# Patient Record
Sex: Female | Born: 1965 | Race: White | Hispanic: No | Smoking: Never smoker
Health system: Southern US, Community
[De-identification: ages and names within clinical notes are randomized; demographics above are authoritative.]

## PROBLEM LIST (undated history)

## (undated) DIAGNOSIS — R112 Nausea with vomiting, unspecified: Secondary | ICD-10-CM

## (undated) DIAGNOSIS — I493 Ventricular premature depolarization: Secondary | ICD-10-CM

## (undated) DIAGNOSIS — E611 Iron deficiency: Secondary | ICD-10-CM

## (undated) DIAGNOSIS — Z8489 Family history of other specified conditions: Secondary | ICD-10-CM

## (undated) DIAGNOSIS — K219 Gastro-esophageal reflux disease without esophagitis: Secondary | ICD-10-CM

## (undated) DIAGNOSIS — C4491 Basal cell carcinoma of skin, unspecified: Secondary | ICD-10-CM

## (undated) DIAGNOSIS — Z8719 Personal history of other diseases of the digestive system: Secondary | ICD-10-CM

## (undated) DIAGNOSIS — Z973 Presence of spectacles and contact lenses: Secondary | ICD-10-CM

## (undated) DIAGNOSIS — R569 Unspecified convulsions: Secondary | ICD-10-CM

## (undated) DIAGNOSIS — D649 Anemia, unspecified: Secondary | ICD-10-CM

## (undated) DIAGNOSIS — N289 Disorder of kidney and ureter, unspecified: Secondary | ICD-10-CM

## (undated) DIAGNOSIS — T7840XA Allergy, unspecified, initial encounter: Secondary | ICD-10-CM

## (undated) DIAGNOSIS — I10 Essential (primary) hypertension: Secondary | ICD-10-CM

## (undated) DIAGNOSIS — Z87442 Personal history of urinary calculi: Secondary | ICD-10-CM

## (undated) DIAGNOSIS — Z9889 Other specified postprocedural states: Secondary | ICD-10-CM

## (undated) DIAGNOSIS — Z87440 Personal history of urinary (tract) infections: Secondary | ICD-10-CM

## (undated) DIAGNOSIS — N200 Calculus of kidney: Secondary | ICD-10-CM

## (undated) DIAGNOSIS — M439 Deforming dorsopathy, unspecified: Secondary | ICD-10-CM

## (undated) DIAGNOSIS — N750 Cyst of Bartholin's gland: Secondary | ICD-10-CM

## (undated) DIAGNOSIS — M419 Scoliosis, unspecified: Secondary | ICD-10-CM

## (undated) DIAGNOSIS — E7800 Pure hypercholesterolemia, unspecified: Secondary | ICD-10-CM

## (undated) DIAGNOSIS — G039 Meningitis, unspecified: Secondary | ICD-10-CM

## (undated) HISTORY — PX: TONSILLECTOMY: SUR1361

## (undated) HISTORY — PX: ANTERIOR AND POSTERIOR VAGINAL REPAIR: SUR5

## (undated) HISTORY — PX: FOOT SURGERY: SHX648

## (undated) HISTORY — PX: SEPTOPLASTY: SUR1290

## (undated) HISTORY — PX: GANGLION CYST EXCISION: SHX1691

## (undated) HISTORY — DX: Cyst of Bartholin's gland: N75.0

## (undated) HISTORY — DX: Personal history of urinary calculi: Z87.442

## (undated) HISTORY — PX: RECTAL SURGERY: SHX760

## (undated) HISTORY — DX: Allergy, unspecified, initial encounter: T78.40XA

## (undated) HISTORY — PX: INCONTINENCE SURGERY: SHX676

## (undated) HISTORY — DX: Gastro-esophageal reflux disease without esophagitis: K21.9

## (undated) HISTORY — DX: Personal history of urinary (tract) infections: Z87.440

## (undated) HISTORY — PX: BLADDER SURGERY: SHX569

---

## 1988-08-31 DIAGNOSIS — N750 Cyst of Bartholin's gland: Secondary | ICD-10-CM

## 1988-08-31 HISTORY — DX: Cyst of Bartholin's gland: N75.0

## 2006-04-21 ENCOUNTER — Ambulatory Visit: Payer: Self-pay | Admitting: Surgery

## 2006-04-23 ENCOUNTER — Ambulatory Visit: Payer: Self-pay | Admitting: Surgery

## 2006-06-25 ENCOUNTER — Ambulatory Visit: Payer: Self-pay | Admitting: Gastroenterology

## 2006-10-28 ENCOUNTER — Ambulatory Visit: Payer: Self-pay | Admitting: Obstetrics and Gynecology

## 2006-12-01 ENCOUNTER — Encounter: Payer: Self-pay | Admitting: General Practice

## 2006-12-30 ENCOUNTER — Encounter: Payer: Self-pay | Admitting: General Practice

## 2008-02-02 ENCOUNTER — Other Ambulatory Visit: Payer: Self-pay

## 2008-02-02 ENCOUNTER — Ambulatory Visit: Payer: Self-pay | Admitting: Obstetrics and Gynecology

## 2008-02-13 ENCOUNTER — Ambulatory Visit: Payer: Self-pay | Admitting: Obstetrics and Gynecology

## 2009-08-31 HISTORY — PX: CHOLECYSTECTOMY: SHX55

## 2009-08-31 HISTORY — PX: ENDOMETRIAL ABLATION: SHX621

## 2009-08-31 HISTORY — PX: GASTRIC BYPASS: SHX52

## 2010-04-15 ENCOUNTER — Ambulatory Visit: Payer: Self-pay | Admitting: Family Medicine

## 2010-04-21 ENCOUNTER — Emergency Department: Payer: Self-pay | Admitting: Emergency Medicine

## 2010-04-22 ENCOUNTER — Ambulatory Visit: Payer: Self-pay | Admitting: Surgery

## 2010-05-19 ENCOUNTER — Ambulatory Visit: Payer: Self-pay

## 2011-02-18 ENCOUNTER — Ambulatory Visit: Payer: Self-pay | Admitting: Family Medicine

## 2012-01-18 ENCOUNTER — Ambulatory Visit: Payer: Self-pay

## 2012-01-18 LAB — URINALYSIS, COMPLETE
Bilirubin,UR: NEGATIVE
Nitrite: NEGATIVE
Specific Gravity: 1.015 (ref 1.003–1.030)

## 2012-11-08 ENCOUNTER — Ambulatory Visit: Payer: Self-pay | Admitting: Family Medicine

## 2013-08-14 ENCOUNTER — Ambulatory Visit: Payer: Self-pay | Admitting: Urology

## 2013-08-14 DIAGNOSIS — N3020 Other chronic cystitis without hematuria: Secondary | ICD-10-CM | POA: Insufficient documentation

## 2013-08-14 DIAGNOSIS — N200 Calculus of kidney: Secondary | ICD-10-CM | POA: Insufficient documentation

## 2013-08-14 DIAGNOSIS — N23 Unspecified renal colic: Secondary | ICD-10-CM | POA: Insufficient documentation

## 2013-08-14 LAB — PREGNANCY, URINE: Pregnancy Test, Urine: NEGATIVE m[IU]/mL

## 2013-09-07 ENCOUNTER — Ambulatory Visit: Payer: Self-pay | Admitting: Urology

## 2013-09-20 ENCOUNTER — Ambulatory Visit: Payer: Self-pay | Admitting: Urology

## 2014-03-06 ENCOUNTER — Ambulatory Visit: Payer: Self-pay | Admitting: Family Medicine

## 2014-03-08 ENCOUNTER — Ambulatory Visit: Payer: Self-pay | Admitting: Family Medicine

## 2014-05-30 ENCOUNTER — Ambulatory Visit: Payer: Self-pay

## 2014-08-31 HISTORY — PX: LITHOTRIPSY: SUR834

## 2014-11-21 ENCOUNTER — Ambulatory Visit: Payer: Self-pay | Admitting: Obstetrics and Gynecology

## 2014-12-06 ENCOUNTER — Ambulatory Visit
Admit: 2014-12-06 | Disposition: A | Discharge status: Home / Self Care | Payer: Self-pay | Attending: Obstetrics and Gynecology | Admitting: Obstetrics and Gynecology

## 2014-12-06 HISTORY — PX: COMBINED HYSTEROSCOPY DIAGNOSTIC / D&C: SUR297

## 2014-12-10 ENCOUNTER — Encounter: Payer: Self-pay | Admitting: *Deleted

## 2014-12-24 LAB — SURGICAL PATHOLOGY

## 2014-12-30 NOTE — Op Note (Signed)
PATIENT NAME:  Elizabeth Good, Elizabeth Good MR#:  [id number] DATE OF BIRTH:  04/27/1966  DATE OF PROCEDURE:  12/06/2014  PREOPERATIVE DIAGNOSIS: Abnormal uterine bleeding abnormal endometrial ultrasound.   POSTOPERATIVE DIAGNOSIS: Abnormal uterine bleeding abnormal endometrial ultrasound with normal postablative changes noted.   OPERATION PERFORMED: Hysteroscopy, dilatation and curettage.  ANESTHESIA USED: General.   SURGEON: Malachy Mood, MD  ESTIMATED BLOOD LOSS: Minimal.   OPERATIVE FLUIDS: 500 mL.   URINE OUTPUT: 150 mL.   COMPLICATIONS: None.   DRAINS OR TUBES: None.   IMPLANTS: None.   FINDINGS: Normal-appearing ectocervix. Cavity scarred from prior endometrial ablation. No menometrorrhagia encountered during the dilation. Cervix was grossly normal in appearance.   SPECIMENS REMOVED: Endometrial curettings.   PROCEDURE IN DETAIL: Risks, benefits, and alternatives of the procedure were discussed with the patient prior to proceeding to the Operating Room. The patient was taken to the Operating Room and placed under general endotracheal anesthesia. She was positioned in the dorsal lithotomy position using Allen stirrups, prepped and draped in the usual sterile fashion. Timeout was performed. The bladder was straight catheterized with a red rubber catheter. An operative speculum was placed. The anterior lip of the cervix was grasped with a single-tooth tenaculum. The cervix was sequentially dilated using Pratt dilators. A hysteroscope was then advanced into the uterus, noting a scarred cavity, but otherwise normal findings. A sharp curettage was performed. Following the sharp curettage, second look hysteroscopy was performed, noting clearance of most of the  previously noted adhesions. The hysteroscope was removed, as was the single-tooth tenaculum. Sponge, needle, and instrument counts were correct x2. The patient tolerated the procedure well and was taken to the recovery room in stable  condition.   ____________________________ Stoney Bang. Georgianne Fick, MD ams:AT D: 12/06/2014 22:36:13 ET T: 12/07/2014 08:29:56 ET JOB#: [id number]  cc: Stoney Bang. Georgianne Fick, MD, <Dictator> Dorthula Nettles MD ELECTRONICALLY SIGNED 12/19/2014 22:44

## 2015-04-03 LAB — BASIC METABOLIC PANEL
Anion Gap: 6 — ABNORMAL LOW (ref 7–16)
BUN: 10 mg/dL
Calcium, Total: 8.5 mg/dL — ABNORMAL LOW
Chloride: 103 mmol/L
Co2: 28 mmol/L
Creatinine: 0.71 mg/dL
EGFR (African American): >60
EGFR (Non-African Amer.): >60
Glucose: 89 mg/dL
Potassium: 3.3 mmol/L — ABNORMAL LOW
SODIUM: 137 mmol/L

## 2015-04-03 LAB — CBC
HCT: 34.9 % — AB (ref 35.0–47.0)
HGB: 11.1 g/dL — ABNORMAL LOW (ref 12.0–16.0)
MCH: 25.5 pg — ABNORMAL LOW (ref 26.0–34.0)
MCHC: 31.8 g/dL — ABNORMAL LOW (ref 32.0–36.0)
MCV: 80 fL (ref 80–100)
Platelet: 275 10*3/uL (ref 150–440)
RBC: 4.34 10*6/uL (ref 3.80–5.20)
RDW: 14.5 % (ref 11.5–14.5)
WBC: 6.7 10*3/uL (ref 3.6–11.0)

## 2015-07-19 ENCOUNTER — Ambulatory Visit: Payer: Self-pay | Admitting: Physician Assistant

## 2015-07-19 ENCOUNTER — Encounter: Payer: Self-pay | Admitting: Physician Assistant

## 2015-07-19 VITALS — BP 150/100 | Temp 99.1°F

## 2015-07-19 DIAGNOSIS — R059 Cough, unspecified: Secondary | ICD-10-CM

## 2015-07-19 DIAGNOSIS — Z299 Encounter for prophylactic measures, unspecified: Secondary | ICD-10-CM

## 2015-07-19 DIAGNOSIS — R05 Cough: Secondary | ICD-10-CM

## 2015-07-19 DIAGNOSIS — J019 Acute sinusitis, unspecified: Secondary | ICD-10-CM

## 2015-07-19 MED ORDER — AZITHROMYCIN 250 MG PO TABS: ORAL_TABLET | ORAL | Status: DC

## 2015-07-19 MED ORDER — HYDROCOD POLST-CPM POLST ER 10-8 MG/5ML PO SUER: 5.0000 mL | Freq: Two times a day (BID) | ORAL | Status: DC | PRN

## 2015-07-19 MED ORDER — FLUCONAZOLE 150 MG PO TABS: 150.0000 mg | ORAL_TABLET | Freq: Once | ORAL | Status: DC

## 2015-07-19 NOTE — Progress Notes (Signed)
S/ one wk hx of nasal congestion, now with dental /facial pain, malaise, blowing green , cough not sleeping due to drainage  O/ low grade temp noted mildly ill appearing  ENT: nasal mucosa inflamed with purulent drainage + r max tenderness , tms and throat clear Neck supple heart rsr lungs clear A/ rhinosinusitis  Cough  P/ rx zpack, diflucan , H/C chlorphen ER i tsp q 12 h prn #100 cc 0 rf.  Supportive measures

## 2015-08-12 ENCOUNTER — Ambulatory Visit (INDEPENDENT_AMBULATORY_CARE_PROVIDER_SITE_OTHER): Payer: BC Managed Care – PPO

## 2015-08-12 ENCOUNTER — Encounter: Payer: Self-pay | Admitting: Emergency Medicine

## 2015-08-12 ENCOUNTER — Ambulatory Visit
Admission: EM | Admit: 2015-08-12 | Discharge: 2015-08-12 | Disposition: A | Discharge status: Home / Self Care | Payer: BC Managed Care – PPO | Attending: Family Medicine | Admitting: Family Medicine

## 2015-08-12 DIAGNOSIS — N390 Urinary tract infection, site not specified: Secondary | ICD-10-CM | POA: Diagnosis not present

## 2015-08-12 DIAGNOSIS — R109 Unspecified abdominal pain: Secondary | ICD-10-CM | POA: Diagnosis not present

## 2015-08-12 HISTORY — DX: Disorder of kidney and ureter, unspecified: N28.9

## 2015-08-12 LAB — URINALYSIS COMPLETE WITH MICROSCOPIC (ARMC ONLY)
BILIRUBIN URINE: NEGATIVE
Glucose, UA: NEGATIVE mg/dL
KETONES UR: NEGATIVE mg/dL
LEUKOCYTES UA: NEGATIVE
NITRITE: NEGATIVE
PH: 5.5 (ref 5.0–8.0)
Protein, ur: 30 mg/dL — AB
SPECIFIC GRAVITY, URINE: 1.03 (ref 1.005–1.030)

## 2015-08-12 MED ORDER — FLUCONAZOLE 150 MG PO TABS: 150.0000 mg | ORAL_TABLET | Freq: Every day | ORAL | Status: DC

## 2015-08-12 MED ORDER — CIPROFLOXACIN HCL 500 MG PO TABS: 500.0000 mg | ORAL_TABLET | Freq: Two times a day (BID) | ORAL | Status: DC

## 2015-08-12 MED ORDER — HYDROCODONE-ACETAMINOPHEN 5-325 MG PO TABS: 1.0000 | ORAL_TABLET | Freq: Four times a day (QID) | ORAL | Status: DC | PRN

## 2015-08-12 NOTE — ED Notes (Signed)
Patient c/o burning when urinating and lower back pain and nausea for 2 weeks.  Patient denies fever.

## 2015-08-12 NOTE — ED Provider Notes (Signed)
CSN: [id number]     Arrival date & time 08/12/15  1127 History   First MD Initiated Contact with Patient 08/12/15 1216     Chief Complaint  Patient presents with  . Dysuria   (Consider location/radiation/quality/duration/timing/severity/associated sxs/prior Treatment) Patient is a 49 y.o. female presenting with dysuria. The history is provided by the patient.  Dysuria Pain quality:  Burning Pain severity:  Mild Onset quality:  Sudden Duration:  2 weeks Timing:  Constant Progression:  Worsening Chronicity:  New Recent urinary tract infections: no   Relieved by:  None tried Ineffective treatments:  None tried Urinary symptoms: discolored urine, frequent urination and hematuria   Urinary symptoms: no hesitancy and no bladder incontinence   Associated symptoms: no fever, no flank pain, no nausea, no vaginal discharge and no vomiting   Risk factors: hx of urolithiasis     Past Medical History  Diagnosis Date  . Renal disorder    Past Surgical History  Procedure Laterality Date  . Tonsillectomy    . Cholecystectomy    . Gastric bypass    . Bladder surgery    . Rectal surgery    . Foot surgery Left    History reviewed. No pertinent family history. Social History  Substance Use Topics  . Smoking status: Never Smoker   . Smokeless tobacco: None  . Alcohol Use: No   OB History    No data available     Review of Systems  Constitutional: Negative for fever.  Gastrointestinal: Negative for nausea and vomiting.  Genitourinary: Positive for dysuria. Negative for flank pain and vaginal discharge.    Allergies  Latex; Penicillins; and Sulfa antibiotics  Home Medications   Prior to Admission medications   Medication Sig Start Date End Date Taking? Authorizing Provider  azithromycin (ZITHROMAX Z-PAK) 250 MG tablet As directed 07/19/15   Blima Singer, FNP  cetirizine (ZYRTEC) 10 MG tablet Take 10 mg by mouth daily.    Historical Provider, MD   chlorpheniramine-HYDROcodone (TUSSIONEX PENNKINETIC ER) 10-8 MG/5ML SUER Take 5 mLs by mouth every 12 (twelve) hours as needed for cough. 07/19/15   Tommie Homero Fellers, FNP  ciprofloxacin (CIPRO) 500 MG tablet Take 1 tablet (500 mg total) by mouth every 12 (twelve) hours. 08/12/15   Norval Gable, MD  fluconazole (DIFLUCAN) 150 MG tablet Take 1 tablet (150 mg total) by mouth daily. 08/12/15   Norval Gable, MD  HYDROcodone-acetaminophen (NORCO/VICODIN) 5-325 MG tablet Take 1-2 tablets by mouth every 6 (six) hours as needed. 08/12/15   Norval Gable, MD  lamoTRIgine (LAMICTAL) 200 MG tablet Take 200 mg by mouth daily.    Historical Provider, MD  pantoprazole (PROTONIX) 40 MG tablet Take 40 mg by mouth daily.    Historical Provider, MD   Meds Ordered and Administered this Visit  Medications - No data to display  BP 162/92 mmHg  Pulse 88  Temp(Src) 97 F (36.1 C) (Tympanic)  Resp 16  Ht 5\' 5"  (1.651 m)  Wt 160 lb (72.576 kg)  BMI 26.63 kg/m2  SpO2 100%  LMP 07/23/2015 (Exact Date) No data found.   Physical Exam  Constitutional: She appears well-developed and well-nourished. No distress.  Abdominal: Soft. Bowel sounds are normal. She exhibits no distension and no mass. There is tenderness (mild suprapubic tenderness to palpation; mild left flank tenderness). There is no rebound and no guarding.  Skin: She is not diaphoretic.  Nursing note and vitals reviewed.   ED Course  Procedures (including critical care time)  Labs Review Labs Reviewed  URINALYSIS COMPLETEWITH MICROSCOPIC ([place] ONLY) - Abnormal; Notable for the following:    APPearance CLOUDY (*)    Hgb urine dipstick 3+ (*)    Protein, ur 30 (*)    Bacteria, UA MANY (*)    Squamous Epithelial / LPF 6-30 (*)    All other components within normal limits  URINE CULTURE    Imaging Review Dg Abd 2 Views  08/12/2015  CLINICAL DATA:  Left flank pain for 1 day with nausea. History of stones, gastric bypass and  cholecystectomy. EXAM: ABDOMEN - 2 VIEW COMPARISON:  Radiographs 09/20/2013 and 09/07/2013. FINDINGS: There are no suspicious calcifications over the kidneys or expected course of the ureters. There are postsurgical changes from gastric bypass and cholecystectomy. Moderate stool is present throughout the colon. There is no free intraperitoneal air. Moderate convex left lumbar scoliosis appears unchanged. IMPRESSION: No radiographic evidence of urinary tract calculus. Moderate stool throughout the colon. Electronically Signed   By: Richardean Sale M.D.   On: 08/12/2015 13:40     Visual Acuity Review  Right Eye Distance:   Left Eye Distance:   Bilateral Distance:    Right Eye Near:   Left Eye Near:    Bilateral Near:         MDM   1. UTI (lower urinary tract infection)   2. Left flank pain    Discharge Medication List as of 08/12/2015  1:50 PM    START taking these medications   Details  ciprofloxacin (CIPRO) 500 MG tablet Take 1 tablet (500 mg total) by mouth every 12 (twelve) hours., Starting 08/12/2015, Until Discontinued, Normal    HYDROcodone-acetaminophen (NORCO/VICODIN) 5-325 MG tablet Take 1-2 tablets by mouth every 6 (six) hours as needed., Starting 08/12/2015, Until Discontinued, Print      1. Labs/x-ray results and diagnosis reviewed with patient 2. rx as per orders above; reviewed possible side effects, interactions, risks and benefits  3. Recommend supportive treatment with increased fluids/water intake 4. Follow-up prn if symptoms worsen or don't improve    Norval Gable, MD 08/12/15 1537

## 2015-08-14 LAB — URINE CULTURE
Culture: 80000
SPECIAL REQUESTS: NORMAL

## 2016-02-24 ENCOUNTER — Other Ambulatory Visit: Payer: Self-pay | Admitting: Family Medicine

## 2016-02-24 DIAGNOSIS — N6011 Diffuse cystic mastopathy of right breast: Secondary | ICD-10-CM

## 2016-02-24 DIAGNOSIS — N6012 Diffuse cystic mastopathy of left breast: Secondary | ICD-10-CM

## 2016-02-24 DIAGNOSIS — N63 Unspecified lump in unspecified breast: Secondary | ICD-10-CM

## 2016-02-25 DIAGNOSIS — E7800 Pure hypercholesterolemia, unspecified: Secondary | ICD-10-CM | POA: Insufficient documentation

## 2016-03-06 ENCOUNTER — Ambulatory Visit
Admission: RE | Admit: 2016-03-06 | Discharge: 2016-03-06 | Disposition: A | Discharge status: Home / Self Care | Payer: BC Managed Care – PPO | Source: Ambulatory Visit | Attending: Family Medicine | Admitting: Family Medicine

## 2016-03-06 DIAGNOSIS — N6011 Diffuse cystic mastopathy of right breast: Secondary | ICD-10-CM

## 2016-03-06 DIAGNOSIS — N63 Unspecified lump in unspecified breast: Secondary | ICD-10-CM

## 2016-03-06 DIAGNOSIS — N6012 Diffuse cystic mastopathy of left breast: Secondary | ICD-10-CM

## 2016-03-06 DIAGNOSIS — Q859 Phakomatosis, unspecified: Secondary | ICD-10-CM | POA: Insufficient documentation

## 2016-03-06 HISTORY — DX: Basal cell carcinoma of skin, unspecified: C44.91

## 2016-03-11 DIAGNOSIS — Q859 Phakomatosis, unspecified: Secondary | ICD-10-CM | POA: Insufficient documentation

## 2016-11-08 ENCOUNTER — Ambulatory Visit
Admission: EM | Admit: 2016-11-08 | Discharge: 2016-11-08 | Disposition: A | Discharge status: Home / Self Care | Payer: BC Managed Care – PPO | Attending: Family Medicine | Admitting: Family Medicine

## 2016-11-08 ENCOUNTER — Telehealth: Payer: Self-pay | Admitting: Gynecology

## 2016-11-08 ENCOUNTER — Encounter: Payer: Self-pay | Admitting: Gynecology

## 2016-11-08 DIAGNOSIS — J0100 Acute maxillary sinusitis, unspecified: Secondary | ICD-10-CM

## 2016-11-08 DIAGNOSIS — N390 Urinary tract infection, site not specified: Secondary | ICD-10-CM

## 2016-11-08 DIAGNOSIS — R319 Hematuria, unspecified: Secondary | ICD-10-CM | POA: Diagnosis not present

## 2016-11-08 HISTORY — DX: Pure hypercholesterolemia, unspecified: E78.00

## 2016-11-08 HISTORY — DX: Unspecified convulsions: R56.9

## 2016-11-08 HISTORY — DX: Meningitis, unspecified: G03.9

## 2016-11-08 LAB — URINALYSIS, COMPLETE (UACMP) WITH MICROSCOPIC
Bilirubin Urine: NEGATIVE
Glucose, UA: NEGATIVE mg/dL
Nitrite: NEGATIVE
Specific Gravity, Urine: 1.015 (ref 1.005–1.030)
pH: 6.5 (ref 5.0–8.0)

## 2016-11-08 MED ORDER — FLUCONAZOLE 150 MG PO TABS: 150.0000 mg | ORAL_TABLET | Freq: Every day | ORAL | 0 refills | Status: DC

## 2016-11-08 MED ORDER — CEFDINIR 300 MG PO CAPS: 300.0000 mg | ORAL_CAPSULE | Freq: Two times a day (BID) | ORAL | 0 refills | Status: AC

## 2016-11-08 NOTE — ED Provider Notes (Signed)
[facility] ____________________________________________  Time seen: Approximately 12:05 PM  I have reviewed the triage vital signs and the nursing notes.   HISTORY  Chief Complaint Sinusitis and Cystitis  HPI Elizabeth Good is a 51 y.o. female presenting for the complaint of 6 days of gradual onset of urinary frequency, urinary urgency and burning with urination. Patient also reports in the same timeframe she has had sinus pressure, sinus pain and sinus discomfort. Reports postnasal drainage. Denies drainage from the nose. Denies sore throat, cough. States possible fever at very beginning of symptom onset, denies other fevers. Reports continues to eat and drink well. Reports has continued to remain active. Reports continues to eat and drink well. Denies ear discomfort. States mild to moderate sinus pain at this time, consistent with previous sinus infections. Denies other headache. Denies vaginal complaints, vaginal discharge, vaginal pain or vaginal odor. Reports currently on menses as of today.  Denies chest pain, shortness of breath, abdominal pain, dysuria, vision changes, ear pain, extremity pain, extremity swelling or rash. Denies recent sickness. Denies recent antibiotic use.   Patient's last menstrual period was 11/08/2016.  Gayland Curry, MD: PCP Also follows with Dr. Elisabeth [facility]   Past Medical History:  Diagnosis Date  . Basal cell carcinoma   . High cholesterol   . Meningitis   . Renal disorder   . Seizures ([place])     There are no active problems to display for this patient.   Past Surgical History:  Procedure Laterality Date  . BLADDER SURGERY    . CHOLECYSTECTOMY    . FOOT SURGERY Left   . GASTRIC BYPASS    . INCONTINENCE SURGERY    . RECTAL SURGERY    . SEPTOPLASTY    . TONSILLECTOMY       No current facility-administered medications for this encounter.   Current Outpatient Prescriptions:  .  cetirizine (ZYRTEC) 10 MG tablet, Take  10 mg by mouth daily., Disp: , Rfl:  .  dexlansoprazole (DEXILANT) 60 MG capsule, Take 60 mg by mouth daily., Disp: , Rfl:  .  lamoTRIgine (LAMICTAL) 200 MG tablet, Take 200 mg by mouth daily., Disp: , Rfl:  .  azithromycin (ZITHROMAX Z-PAK) 250 MG tablet, As directed, Disp: 6 each, Rfl: 0 .  cefdinir (OMNICEF) 300 MG capsule, Take 1 capsule (300 mg total) by mouth 2 (two) times daily., Disp: 20 capsule, Rfl: 0 .  chlorpheniramine-HYDROcodone (TUSSIONEX PENNKINETIC ER) 10-8 MG/5ML SUER, Take 5 mLs by mouth every 12 (twelve) hours as needed for cough., Disp: 100 mL, Rfl: 0 .  ciprofloxacin (CIPRO) 500 MG tablet, Take 1 tablet (500 mg total) by mouth every 12 (twelve) hours., Disp: 14 tablet, Rfl: 0 .  fluconazole (DIFLUCAN) 150 MG tablet, Take 1 tablet (150 mg total) by mouth daily., Disp: 1 tablet, Rfl: 0 .  HYDROcodone-acetaminophen (NORCO/VICODIN) 5-325 MG tablet, Take 1-2 tablets by mouth every 6 (six) hours as needed., Disp: 10 tablet, Rfl: 0 .  pantoprazole (PROTONIX) 40 MG tablet, Take 40 mg by mouth daily., Disp: , Rfl:   Allergies Latex; Penicillins; and Sulfa antibiotics  Family History  Problem Relation Age of Onset  . Bladder Cancer Mother   . Prostate cancer Father   . Melanoma Father   . Breast cancer Cousin     Social History Social History  Substance Use Topics  . Smoking status: Never Smoker  . Smokeless tobacco: Not on file  . Alcohol use No    Review of Systems Constitutional: No fever/chills  Eyes: No visual changes. ENT: No sore throat. Cardiovascular: Denies chest pain. Respiratory: Denies shortness of breath. Gastrointestinal: No abdominal pain.  No nausea, no vomiting.  No diarrhea.  No constipation. Genitourinary: Positive for dysuria. Musculoskeletal: Negative for back pain. Skin: Negative for rash. Neurological: Negative for headaches, focal weakness or numbness.  10-point ROS otherwise  negative.  ____________________________________________   PHYSICAL EXAM:  VITAL SIGNS: ED Triage Vitals  Enc Vitals Group     BP 11/08/16 1158 136/88     Pulse Rate 11/08/16 1158 95     Resp 11/08/16 1158 16     Temp 11/08/16 1158 98.5 F (36.9 C)     Temp Source 11/08/16 1158 Oral     SpO2 11/08/16 1158 100 %     Weight 11/08/16 1200 153 lb (69.4 kg)     Height 11/08/16 1200 5\' 5"  (1.651 m)     Head Circumference --      Peak Flow --      Pain Score 11/08/16 1202 5     Pain Loc --      Pain Edu? --      Excl. in [place]? --    Constitutional: Alert and oriented. Well appearing and in no acute distress. Eyes: Conjunctivae are normal. PERRL. EOMI. Head: Atraumatic.Mild to moderate tenderness to palpation bilateral frontal and maxillary sinuses. No swelling. No erythema.   Ears: Mild bilateral nasal turbinate erythema and edema. No rhinorrhea.  Mouth/Throat: Mucous membranes are moist.  Oropharynx non-erythematous. Posterior pharyngeal cobblestoning. No tonsillar swelling or exudate.  Neck: No stridor.  No cervical spine tenderness to palpation. Hematological/Lymphatic/Immunilogical: No cervical lymphadenopathy. Cardiovascular: Normal rate, regular rhythm. Grossly normal heart sounds.  Good peripheral circulation. Respiratory: Normal respiratory effort.  No retractions. No wheezes, rales or rhonchi. Good air movement.  Gastrointestinal: Soft and nontender. No distention. No CVA tenderness. Musculoskeletal:  No cervical, thoracic or lumbar tenderness to palpation.  Neurologic:  Normal speech and language.  No gait instability. Skin:  Skin is warm, dry and intact. No rash noted. Psychiatric: Mood and affect are normal. Speech and behavior are normal.  ___________________________________________   LABS (all labs ordered are listed, but only abnormal results are displayed)  Labs Reviewed  URINALYSIS, COMPLETE (UACMP) WITH MICROSCOPIC - Abnormal; Notable for the following:        Result Value   APPearance HAZY (*)    Hgb urine dipstick SMALL (*)    Ketones, ur TRACE (*)    Protein, ur TRACE (*)    Leukocytes, UA SMALL (*)    Squamous Epithelial / LPF 6-30 (*)    Bacteria, UA FEW (*)    All other components within normal limits  URINE CULTURE     PROCEDURES Procedures    INITIAL IMPRESSION / ASSESSMENT AND PLAN / ED COURSE  Pertinent labs & imaging results that were available during my care of the patient were reviewed by me and considered in my medical decision making (see chart for details).  Well-appearing patient. No acute distress. Urinalysis reviewed. Discussed with patient not clear urinary tract infection, but as with patient reported symptoms, suspect UTI. Will culture urine. Also discussed with patient sinusitis complaints could be related to viral versus allergic rhinitis encouraged patient to continue home Claritin or Zyrtec for seasonal allergies as well as Flonase use.  Will treat patient with oral cefdinir for both urinary and sinus coverage as patient is penicillin allergic. Encouraged supportive care, rest, fluids and follow-up as needed. Discussed indication, risks and benefits  of medications with patient.  Discussed follow up with Primary care physician this week. Discussed follow up and return parameters including no resolution or any worsening concerns. Patient verbalized understanding and agreed to plan.   ____________________________________________   FINAL CLINICAL IMPRESSION(S) / ED DIAGNOSES  Final diagnoses:  Acute maxillary sinusitis, recurrence not specified  Urinary tract infection with hematuria, site unspecified     Discharge Medication List as of 11/08/2016 12:45 PM    START taking these medications   Details  cefdinir (OMNICEF) 300 MG capsule Take 1 capsule (300 mg total) by mouth 2 (two) times daily., Starting Sun 11/08/2016, Until Wed 11/18/2016, Normal        Note: This dictation was prepared with Dragon dictation  along with smaller phrase technology. Any transcriptional errors that result from this process are unintentional.         Marylene Land, NP 11/08/16 1313

## 2016-11-08 NOTE — Discharge Instructions (Signed)
Take medication as prescribed. Rest. Drink plenty of fluids.  ° °Follow up with your primary care physician this week as needed. Return to Urgent care for new or worsening concerns.  ° °

## 2016-11-08 NOTE — ED Triage Notes (Signed)
Patient c/o sinus infection and bladder infection. Per patient sinus pressure and burning sensation with urination.

## 2016-11-10 LAB — URINE CULTURE: CULTURE: NO GROWTH

## 2016-12-03 DIAGNOSIS — M5116 Intervertebral disc disorders with radiculopathy, lumbar region: Secondary | ICD-10-CM | POA: Diagnosis not present

## 2016-12-03 DIAGNOSIS — M546 Pain in thoracic spine: Secondary | ICD-10-CM | POA: Diagnosis not present

## 2016-12-03 DIAGNOSIS — M461 Sacroiliitis, not elsewhere classified: Secondary | ICD-10-CM | POA: Diagnosis not present

## 2016-12-03 DIAGNOSIS — M9902 Segmental and somatic dysfunction of thoracic region: Secondary | ICD-10-CM | POA: Diagnosis not present

## 2016-12-03 DIAGNOSIS — M5117 Intervertebral disc disorders with radiculopathy, lumbosacral region: Secondary | ICD-10-CM | POA: Diagnosis not present

## 2016-12-03 DIAGNOSIS — M62830 Muscle spasm of back: Secondary | ICD-10-CM | POA: Diagnosis not present

## 2016-12-03 DIAGNOSIS — M955 Acquired deformity of pelvis: Secondary | ICD-10-CM | POA: Diagnosis not present

## 2016-12-03 DIAGNOSIS — M9905 Segmental and somatic dysfunction of pelvic region: Secondary | ICD-10-CM | POA: Diagnosis not present

## 2016-12-03 DIAGNOSIS — M9903 Segmental and somatic dysfunction of lumbar region: Secondary | ICD-10-CM | POA: Diagnosis not present

## 2016-12-18 ENCOUNTER — Encounter: Payer: Self-pay | Admitting: Obstetrics and Gynecology

## 2016-12-21 ENCOUNTER — Ambulatory Visit (INDEPENDENT_AMBULATORY_CARE_PROVIDER_SITE_OTHER): Payer: BC Managed Care – PPO | Admitting: Obstetrics and Gynecology

## 2016-12-21 ENCOUNTER — Encounter: Payer: Self-pay | Admitting: Obstetrics and Gynecology

## 2016-12-21 VITALS — BP 134/82 | HR 80 | Ht 66.0 in | Wt 158.0 lb

## 2016-12-21 DIAGNOSIS — N939 Abnormal uterine and vaginal bleeding, unspecified: Secondary | ICD-10-CM

## 2016-12-21 NOTE — Patient Instructions (Signed)

## 2016-12-21 NOTE — Progress Notes (Signed)
Gynecology Abnormal Uterine Bleeding Initial Evaluation   Chief Complaint:  Chief Complaint  Patient presents with  . Menstrual Problem    Heavy    History of Present Illness:    Paitient is a 51 y.o. G2P2 who LMP was Patient's last menstrual period was 11/28/2016., presents today for a problem visit. Over the past year she has noted heavier menstrual bleeding, with increased dysmenorrhea.  She underwent prior endometrial ablation several years ago, presenting with cramping and bleeding in 2016.  At the time of her hysteroscopy pathology demonstrated normal findings and she had some mild scarring of the endometrial cavity.  She was also managed previously with IUD for a period of time following her ablation.  Review of Systems: Review of Systems  Constitutional: Negative for chills, fever, malaise/fatigue and weight loss.  Gastrointestinal: Positive for abdominal pain.  Genitourinary: Negative for dysuria, flank pain, frequency, hematuria and urgency.    Past Medical History:  Past Medical History:  Diagnosis Date  . Bartholin cyst 1990  . Basal cell carcinoma   . High cholesterol   . Meningitis    Age 22  . Renal disorder   . Seizures ([place])     Past Surgical History:  Past Surgical History:  Procedure Laterality Date  . ANTERIOR AND POSTERIOR VAGINAL REPAIR     [facility]  . BLADDER SURGERY    . CHOLECYSTECTOMY  2011  . COMBINED HYSTEROSCOPY DIAGNOSTIC / D&C  12/06/2014   [facility]  . ENDOMETRIAL ABLATION  2011   [facility]  Dr. Amalia Hailey  . FOOT SURGERY Left   . GANGLION CYST EXCISION    . GASTRIC BYPASS  2011  . INCONTINENCE SURGERY    . RECTAL SURGERY    . SEPTOPLASTY    . TONSILLECTOMY      Obstetric History: G2P2  Family History:  Family History  Problem Relation Age of Onset  . Bladder Cancer Mother   . Prostate cancer Father   . Melanoma Father   . Breast cancer Cousin     Social History:  Social History   Social History  . Marital  status: Legally Separated    Spouse name: N/A  . Number of children: N/A  . Years of education: N/A   Occupational History  . Not on file.   Social History Main Topics  . Smoking status: Never Smoker  . Smokeless tobacco: Never Used  . Alcohol use No  . Drug use: Unknown  . Sexual activity: No   Other Topics Concern  . Not on file   Social History Narrative  . No narrative on file    Allergies:  Allergies  Allergen Reactions  . Latex   . Penicillins   . Sulfa Antibiotics     Medications: Prior to Admission medications   Medication Sig Start Date End Date Taking? Authorizing Provider  cetirizine (ZYRTEC) 10 MG tablet Take 10 mg by mouth daily.   Yes Historical Provider, MD  dexlansoprazole (DEXILANT) 60 MG capsule Take 60 mg by mouth daily.   Yes Historical Provider, MD  lamoTRIgine (LAMICTAL) 200 MG tablet Take 200 mg by mouth daily.   Yes Historical Provider, MD    Physical Exam Vitals:  Vitals:   12/21/16 1053  BP: 134/82  Pulse: 80   Patient's last menstrual period was 11/28/2016.  General: NAD HEENT: normocephalic, anicterics Pulmonary: No increased work of breathing Extremities: no edema, erythema, or tenderness Neurologic: Grossly intact Psychiatric: mood appropriate, affect full  Female  chaperone present for pelvic portions of the physical exam  Assessment: 51 y.o. G2P2 AUB likely secondary to prior ablation  Plan: Problem List Items Addressed This Visit    None    Visit Diagnoses    Abnormal uterine bleeding    -  Primary   Relevant Orders   US Transvaginal Non-OB       1) Discussed management options for abnormal uterine bleeding including expectant, NSAIDs, tranexamic acid (Lysteda), oral progesterone (Provera, norethindrone, megace), Depo Provera, Levonorgestrel containing IUD, endometrial ablation (Novasure) or hysterectomy as definitive surgical management.  Discussed risks and benefits of each method.   Final management decision will  hinge on results of patient's work up and whether an underlying etiology for the patients bleeding symptoms can be discerned.  Will evaluate uterine cavity with TVUS.   Printed patient education handouts were given to the patient to review at home.  Bleeding precautions reviewed. Prior ablation, D&C hysteroscopy 2016 some scarring of the cavity normal biopsy results though.  2)A total of 15 minutes were spent in face-to-face contact with the patient during this encounter with over half of that time devoted to counseling and coordination of care.

## 2017-01-04 ENCOUNTER — Encounter: Payer: Self-pay | Admitting: Obstetrics and Gynecology

## 2017-01-04 ENCOUNTER — Ambulatory Visit (INDEPENDENT_AMBULATORY_CARE_PROVIDER_SITE_OTHER): Payer: 59 | Admitting: Obstetrics and Gynecology

## 2017-01-04 ENCOUNTER — Ambulatory Visit (INDEPENDENT_AMBULATORY_CARE_PROVIDER_SITE_OTHER): Payer: 59

## 2017-01-04 VITALS — BP 136/82 | HR 86 | Wt 156.0 lb

## 2017-01-04 DIAGNOSIS — N939 Abnormal uterine and vaginal bleeding, unspecified: Secondary | ICD-10-CM | POA: Diagnosis not present

## 2017-01-04 DIAGNOSIS — N809 Endometriosis, unspecified: Principal | ICD-10-CM

## 2017-01-04 DIAGNOSIS — N800 Endometriosis of uterus: Secondary | ICD-10-CM

## 2017-01-04 DIAGNOSIS — N8003 Adenomyosis of the uterus: Secondary | ICD-10-CM

## 2017-01-04 MED ORDER — NORETHINDRONE ACETATE 5 MG PO TABS: 5.0000 mg | ORAL_TABLET | Freq: Every day | ORAL | 11 refills | Status: DC

## 2017-01-04 NOTE — Progress Notes (Signed)
Gynecology Ultrasound Follow Up  Chief Complaint:  Chief Complaint  Patient presents with  . U/S follow up     History of Present Illness: Patient is a 51 y.o. female who presents today for ultrasound evaluation of abnormal uterine bleeding and dysmenorrhea in setting of prior endometrial ablation, prior hysteroscopy D&C showing normal findings.  Ultrasound demonstrates the following findgins Adnexa: normal appearance, small functional cyst Uterus: Endometrium 61mm but irregular myometrial endometrial interface consistent with adenomyosis more pronounce anterior.    Review of Systems: Review of Systems  Constitutional: Negative for malaise/fatigue and weight loss.  Gastrointestinal: Negative for abdominal pain.    Past Medical History:  Past Medical History:  Diagnosis Date  . Bartholin cyst 1990  . Basal cell carcinoma   . High cholesterol   . Meningitis    Age 23  . Renal disorder   . Seizures ([place])     Past Surgical History:  Past Surgical History:  Procedure Laterality Date  . ANTERIOR AND POSTERIOR VAGINAL REPAIR     [facility]  . BLADDER SURGERY    . CHOLECYSTECTOMY  2011  . COMBINED HYSTEROSCOPY DIAGNOSTIC / D&C  12/06/2014   [facility]  . ENDOMETRIAL ABLATION  2011   [facility]  Dr. Amalia Hailey  . FOOT SURGERY Left   . GANGLION CYST EXCISION    . GASTRIC BYPASS  2011  . INCONTINENCE SURGERY    . RECTAL SURGERY    . SEPTOPLASTY    . TONSILLECTOMY      Gynecologic History:  Patient's last menstrual period was 12/28/2016.  Family History:  Family History  Problem Relation Age of Onset  . Bladder Cancer Mother   . Prostate cancer Father   . Melanoma Father   . Breast cancer Cousin     Social History:  Social History   Social History  . Marital status: Legally Separated    Spouse name: N/A  . Number of children: N/A  . Years of education: N/A   Occupational History  . Not on file.   Social History Main Topics  . Smoking status:  Never Smoker  . Smokeless tobacco: Never Used  . Alcohol use No  . Drug use: Unknown  . Sexual activity: No   Other Topics Concern  . Not on file   Social History Narrative  . No narrative on file    Allergies:  Allergies  Allergen Reactions  . Latex   . Penicillins   . Sulfa Antibiotics     Medications: Prior to Admission medications   Medication Sig Start Date End Date Taking? Authorizing Provider  cetirizine (ZYRTEC) 10 MG tablet Take 10 mg by mouth daily.    [provider]  dexlansoprazole (DEXILANT) 60 MG capsule Take 60 mg by mouth daily.    [provider]  lamoTRIgine (LAMICTAL) 200 MG tablet Take 200 mg by mouth daily.    [provider]  norethindrone (AYGESTIN) 5 MG tablet Take 1 tablet (5 mg total) by mouth daily. 01/04/17   Malachy Mood, MD    Physical Exam Vitals: Blood pressure 136/82, pulse 86, weight 156 lb (70.8 kg), last menstrual period 12/28/2016.  General: NAD HEENT: normocephalic, anicteric Pulmonary: No increased work of breathing Extremities: no edema, erythema, or tenderness Neurologic: Grossly intact, normal gait Psychiatric: mood appropriate, affect full   Assessment: 51 y.o. G2P2 AUB secondary to adenomyosis in setting of prior endometrial ablation  Plan: Problem List Items Addressed This Visit    None  Visit Diagnoses    Adenomyosis    -  Primary      1) Trial of norethindrone for adenomyosis, if good menstrual suppression could continue vs switching to IUD.  Hysterectomy remains an option given prior ablation. Will assess response in 2-3 months 2) A total of 15 minutes were spent in face-to-face contact with the patient during this encounter with over half of that time devoted to counseling and coordination of care.

## 2017-01-12 DIAGNOSIS — M955 Acquired deformity of pelvis: Secondary | ICD-10-CM | POA: Diagnosis not present

## 2017-01-12 DIAGNOSIS — M546 Pain in thoracic spine: Secondary | ICD-10-CM | POA: Diagnosis not present

## 2017-01-12 DIAGNOSIS — M5116 Intervertebral disc disorders with radiculopathy, lumbar region: Secondary | ICD-10-CM | POA: Diagnosis not present

## 2017-01-12 DIAGNOSIS — M62830 Muscle spasm of back: Secondary | ICD-10-CM | POA: Diagnosis not present

## 2017-01-12 DIAGNOSIS — M9905 Segmental and somatic dysfunction of pelvic region: Secondary | ICD-10-CM | POA: Diagnosis not present

## 2017-01-12 DIAGNOSIS — M9903 Segmental and somatic dysfunction of lumbar region: Secondary | ICD-10-CM | POA: Diagnosis not present

## 2017-01-12 DIAGNOSIS — M461 Sacroiliitis, not elsewhere classified: Secondary | ICD-10-CM | POA: Diagnosis not present

## 2017-01-12 DIAGNOSIS — M5117 Intervertebral disc disorders with radiculopathy, lumbosacral region: Secondary | ICD-10-CM | POA: Diagnosis not present

## 2017-01-12 DIAGNOSIS — M9902 Segmental and somatic dysfunction of thoracic region: Secondary | ICD-10-CM | POA: Diagnosis not present

## 2017-01-14 DIAGNOSIS — D229 Melanocytic nevi, unspecified: Secondary | ICD-10-CM | POA: Diagnosis not present

## 2017-01-14 DIAGNOSIS — D485 Neoplasm of uncertain behavior of skin: Secondary | ICD-10-CM | POA: Diagnosis not present

## 2017-03-08 DIAGNOSIS — Z0000 Encounter for general adult medical examination without abnormal findings: Secondary | ICD-10-CM | POA: Diagnosis not present

## 2017-03-08 DIAGNOSIS — N8000 Endometriosis of the uterus, unspecified: Secondary | ICD-10-CM | POA: Insufficient documentation

## 2017-03-08 DIAGNOSIS — Z9884 Bariatric surgery status: Secondary | ICD-10-CM | POA: Diagnosis not present

## 2017-03-08 DIAGNOSIS — R569 Unspecified convulsions: Secondary | ICD-10-CM | POA: Diagnosis not present

## 2017-03-08 DIAGNOSIS — R300 Dysuria: Secondary | ICD-10-CM | POA: Diagnosis not present

## 2017-03-08 DIAGNOSIS — K219 Gastro-esophageal reflux disease without esophagitis: Secondary | ICD-10-CM | POA: Diagnosis not present

## 2017-03-25 ENCOUNTER — Encounter: Payer: Self-pay | Admitting: Oncology

## 2017-03-25 ENCOUNTER — Inpatient Hospital Stay: Payer: 59 | Attending: Oncology | Admitting: Oncology

## 2017-03-25 DIAGNOSIS — E7800 Pure hypercholesterolemia, unspecified: Secondary | ICD-10-CM | POA: Insufficient documentation

## 2017-03-25 DIAGNOSIS — Z8042 Family history of malignant neoplasm of prostate: Secondary | ICD-10-CM | POA: Diagnosis not present

## 2017-03-25 DIAGNOSIS — Z79899 Other long term (current) drug therapy: Secondary | ICD-10-CM | POA: Diagnosis not present

## 2017-03-25 DIAGNOSIS — Z8051 Family history of malignant neoplasm of kidney: Secondary | ICD-10-CM | POA: Diagnosis not present

## 2017-03-25 DIAGNOSIS — R569 Unspecified convulsions: Secondary | ICD-10-CM | POA: Diagnosis not present

## 2017-03-25 DIAGNOSIS — Z85828 Personal history of other malignant neoplasm of skin: Secondary | ICD-10-CM | POA: Diagnosis not present

## 2017-03-25 DIAGNOSIS — R5383 Other fatigue: Secondary | ICD-10-CM | POA: Insufficient documentation

## 2017-03-25 DIAGNOSIS — Z803 Family history of malignant neoplasm of breast: Secondary | ICD-10-CM | POA: Insufficient documentation

## 2017-03-25 DIAGNOSIS — N2889 Other specified disorders of kidney and ureter: Secondary | ICD-10-CM | POA: Diagnosis not present

## 2017-03-25 DIAGNOSIS — Z9884 Bariatric surgery status: Secondary | ICD-10-CM | POA: Diagnosis not present

## 2017-03-25 DIAGNOSIS — R3129 Other microscopic hematuria: Secondary | ICD-10-CM | POA: Diagnosis not present

## 2017-03-25 DIAGNOSIS — D509 Iron deficiency anemia, unspecified: Secondary | ICD-10-CM | POA: Diagnosis not present

## 2017-03-25 NOTE — Progress Notes (Signed)
[facility] Consultation Note:  Patient Care Team: Gayland Curry, MD as PCP - General (Family Medicine)  CHIEF COMPLAINTS/PURPOSE OF CONSULTATION: My iron level is low.  HISTORY OF PRESENTING ILLNESS: Elizabeth Good 51 y.o. female with PMH list as below is referred by Dr.Aldridge for evaluation and management of iron deficiency anemia.  She has a history of iron deficiency anemia and history of gastric bypass in 2011. She has been taken oral iron supplementation ferrous sulfate until recently she stopped taking due to GI side effects. She reports feeling fatigued all the time. Her last menstrual period was in April. It was usually having. She has a referral to obtain screening colonoscopy.  Today she denies chest pain shortness of breath, abdominal pain, lower extremity swelling.   Review of Systems  Constitutional: Positive for fatigue.  HENT:  Negative.   Eyes: Negative.   Respiratory: Negative.   Cardiovascular: Negative.   Gastrointestinal: Negative.   Endocrine: Negative.   Genitourinary: Negative.    Musculoskeletal: Negative.   Skin: Negative.   Neurological: Negative.   Hematological: Negative.   Psychiatric/Behavioral: Negative.     MEDICAL HISTORY: Past Medical History:  Diagnosis Date  . Bartholin cyst 1990  . Basal cell carcinoma   . High cholesterol   . Meningitis    Age 19  . Renal disorder   . Seizures ([place])     SURGICAL HISTORY: Past Surgical History:  Procedure Laterality Date  . ANTERIOR AND POSTERIOR VAGINAL REPAIR     [facility]  . BLADDER SURGERY    . CHOLECYSTECTOMY  2011  . COMBINED HYSTEROSCOPY DIAGNOSTIC / D&C  12/06/2014   [facility]  . ENDOMETRIAL ABLATION  2011   [facility]  Dr. Amalia Hailey  . FOOT SURGERY Left   . GANGLION CYST EXCISION    . GASTRIC BYPASS  2011  . INCONTINENCE SURGERY    . RECTAL SURGERY    . SEPTOPLASTY    . TONSILLECTOMY      SOCIAL HISTORY: Social History   Social History   . Marital status: Legally Separated    Spouse name: N/A  . Number of children: N/A  . Years of education: N/A   Occupational History  . Not on file.   Social History Main Topics  . Smoking status: Never Smoker  . Smokeless tobacco: Never Used  . Alcohol use No  . Drug use: Unknown  . Sexual activity: No   Other Topics Concern  . Not on file   Social History Narrative  . No narrative on file    FAMILY HISTORY Family History  Problem Relation Age of Onset  . Bladder Cancer Mother   . Prostate cancer Father   . Melanoma Father   . Breast cancer Cousin     ALLERGIES:  is allergic to latex; penicillins; and sulfa antibiotics.  MEDICATIONS:  Current Outpatient Prescriptions  Medication Sig Dispense Refill  . cetirizine (ZYRTEC) 10 MG tablet Take 10 mg by mouth daily.    Marland Kitchen dexlansoprazole (DEXILANT) 60 MG capsule Take 60 mg by mouth daily.    Marland Kitchen lamoTRIgine (LAMICTAL) 200 MG tablet Take 200 mg by mouth daily.    . norethindrone (AYGESTIN) 5 MG tablet Take 1 tablet (5 mg total) by mouth daily. 30 tablet 11   No current facility-administered medications for this visit.     PHYSICAL EXAMINATION:  ECOG PERFORMANCE STATUS: 1 - Symptomatic but completely ambulatory   Vitals:   03/25/17 0855  BP: (!) 146/90  Pulse: 97  Resp: 16  Temp: 97.9 F (36.6 C)    Filed Weights   03/25/17 0855  Weight: 158 lb 13.5 oz (72 kg)     Physical Exam GENERAL: No distress, well nourished.  SKIN:  No rashes or significant lesions  HEAD: Normocephalic, No masses, lesions, tenderness or abnormalities  EYES: non icteric ENT: External ears normal ,lips , buccal mucosa, and tongue normal and mucous membranes are moist  LYMPH: No palpable cervical and axillary lymphadenopathy  LUNGS: Clear to auscultation, no crackles or wheezes HEART: Regular rate & rhythm, no murmurs, no gallops, S1 normal and S2 normal  ABDOMEN: Abdomen soft, non-tender, normal bowel sounds, I did not appreciate  any  masses or organomegaly  MUSCULOSKELETAL: No CVA tenderness and no tenderness on percussion of the back or rib cage.  EXTREMITIES: No edema, no skin discoloration or tenderness NEURO: Alert & oriented, no focal motor/sensory deficits.   LABORATORY DATA: I have personally reviewed the data as listed: Labs from [facility] system.  Her recent labs done on 03/08/2017 showed iron 37, TIBC 440, saturation 8, ferritin 6. Wbc 5.9, Hb 11.8, Hct 37.6, platelet 410, MCV 76, RDW 14.8, normal differential. B12 635, Folate 13.2, Vitamin D 51, TSH 2.06. Cr 0.8, Bilirubin 0.5, ALT 13, AST 23    RADIOGRAPHIC STUDIES: I have personally reviewed the radiological images as listed and agree with the findings in the report Mammogram 03/06/2016 IMPRESSION: 1. 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 2.  No mammographic evidence of malignancy in the bilateral breasts.  ASSESSMENT/PLAN  1. Iron deficiency anemia, unspecified iron deficiency anemia type   2. 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 Plan IV iron with Feraheme 510 mg weekly 2 doses. I discussed with patient that as she has malabsorption, her iron store, folate, B12 need to be closely watched. If needed in the future will give additional IV iron. Rare allergic reactions of IV iron was discussed with patient. Patient is in agreement about the above plan and are willing to proceed. Follow-up with me in 6 weeks with labs done one or 2 days before her visit. All questions were answered. The patient knows to call the clinic with any problems, questions or concerns. Thank you for this kind referral and the opportunity to participate in the care of this patient. A copy of today's note will be routed to referring physician Dr.Aldridge.  Earlie Server, MD  03/25/2017 8:47 AM

## 2017-03-25 NOTE — Progress Notes (Signed)
Patient here today as a new patient  

## 2017-03-29 ENCOUNTER — Inpatient Hospital Stay: Payer: 59

## 2017-03-29 VITALS — BP 126/82 | HR 85 | Temp 97.8°F | Resp 16

## 2017-03-29 DIAGNOSIS — R5383 Other fatigue: Secondary | ICD-10-CM | POA: Diagnosis not present

## 2017-03-29 DIAGNOSIS — Z8051 Family history of malignant neoplasm of kidney: Secondary | ICD-10-CM | POA: Diagnosis not present

## 2017-03-29 DIAGNOSIS — R319 Hematuria, unspecified: Secondary | ICD-10-CM | POA: Diagnosis not present

## 2017-03-29 DIAGNOSIS — R569 Unspecified convulsions: Secondary | ICD-10-CM | POA: Diagnosis not present

## 2017-03-29 DIAGNOSIS — E7800 Pure hypercholesterolemia, unspecified: Secondary | ICD-10-CM | POA: Diagnosis not present

## 2017-03-29 DIAGNOSIS — Z9884 Bariatric surgery status: Secondary | ICD-10-CM | POA: Diagnosis not present

## 2017-03-29 DIAGNOSIS — Z79899 Other long term (current) drug therapy: Secondary | ICD-10-CM | POA: Diagnosis not present

## 2017-03-29 DIAGNOSIS — N2889 Other specified disorders of kidney and ureter: Secondary | ICD-10-CM | POA: Diagnosis not present

## 2017-03-29 DIAGNOSIS — D509 Iron deficiency anemia, unspecified: Secondary | ICD-10-CM | POA: Diagnosis not present

## 2017-03-29 DIAGNOSIS — Z85828 Personal history of other malignant neoplasm of skin: Secondary | ICD-10-CM | POA: Diagnosis not present

## 2017-03-29 MED ORDER — SODIUM CHLORIDE 0.9 % IV SOLN
Freq: Once | INTRAVENOUS | Status: AC
  Administered 2017-03-29: 09:00:00 via INTRAVENOUS
  Filled 2017-03-29: qty 1000

## 2017-03-29 MED ORDER — SODIUM CHLORIDE 0.9 % IV SOLN
510.0000 mg | Freq: Once | INTRAVENOUS | Status: DC
  Filled 2017-03-29: qty 17

## 2017-03-29 NOTE — Patient Instructions (Signed)

## 2017-04-05 ENCOUNTER — Inpatient Hospital Stay: Payer: 59 | Attending: Oncology

## 2017-04-05 VITALS — BP 127/78 | HR 84 | Temp 98.5°F | Resp 16

## 2017-04-05 DIAGNOSIS — Z8619 Personal history of other infectious and parasitic diseases: Secondary | ICD-10-CM | POA: Diagnosis not present

## 2017-04-05 DIAGNOSIS — Z8042 Family history of malignant neoplasm of prostate: Secondary | ICD-10-CM | POA: Insufficient documentation

## 2017-04-05 DIAGNOSIS — Z87440 Personal history of urinary (tract) infections: Secondary | ICD-10-CM | POA: Insufficient documentation

## 2017-04-05 DIAGNOSIS — E7800 Pure hypercholesterolemia, unspecified: Secondary | ICD-10-CM | POA: Diagnosis not present

## 2017-04-05 DIAGNOSIS — N6320 Unspecified lump in the left breast, unspecified quadrant: Secondary | ICD-10-CM | POA: Diagnosis not present

## 2017-04-05 DIAGNOSIS — R635 Abnormal weight gain: Secondary | ICD-10-CM | POA: Insufficient documentation

## 2017-04-05 DIAGNOSIS — K219 Gastro-esophageal reflux disease without esophagitis: Secondary | ICD-10-CM | POA: Insufficient documentation

## 2017-04-05 DIAGNOSIS — D509 Iron deficiency anemia, unspecified: Secondary | ICD-10-CM | POA: Insufficient documentation

## 2017-04-05 DIAGNOSIS — Z8052 Family history of malignant neoplasm of bladder: Secondary | ICD-10-CM | POA: Diagnosis not present

## 2017-04-05 DIAGNOSIS — Z87442 Personal history of urinary calculi: Secondary | ICD-10-CM | POA: Diagnosis not present

## 2017-04-05 DIAGNOSIS — Z79899 Other long term (current) drug therapy: Secondary | ICD-10-CM | POA: Diagnosis not present

## 2017-04-05 DIAGNOSIS — Z9049 Acquired absence of other specified parts of digestive tract: Secondary | ICD-10-CM | POA: Insufficient documentation

## 2017-04-05 DIAGNOSIS — Z85828 Personal history of other malignant neoplasm of skin: Secondary | ICD-10-CM | POA: Insufficient documentation

## 2017-04-05 DIAGNOSIS — R140 Abdominal distension (gaseous): Secondary | ICD-10-CM | POA: Diagnosis not present

## 2017-04-05 DIAGNOSIS — Z803 Family history of malignant neoplasm of breast: Secondary | ICD-10-CM | POA: Insufficient documentation

## 2017-04-05 DIAGNOSIS — G40909 Epilepsy, unspecified, not intractable, without status epilepticus: Secondary | ICD-10-CM | POA: Diagnosis not present

## 2017-04-05 DIAGNOSIS — Z9884 Bariatric surgery status: Secondary | ICD-10-CM | POA: Insufficient documentation

## 2017-04-05 DIAGNOSIS — R030 Elevated blood-pressure reading, without diagnosis of hypertension: Secondary | ICD-10-CM | POA: Insufficient documentation

## 2017-04-05 MED ORDER — SODIUM CHLORIDE 0.9 % IV SOLN
510.0000 mg | Freq: Once | INTRAVENOUS | Status: AC
  Administered 2017-04-05: 510 mg via INTRAVENOUS
  Filled 2017-04-05: qty 17

## 2017-04-05 MED ORDER — SODIUM CHLORIDE 0.9 % IV SOLN
Freq: Once | INTRAVENOUS | Status: AC
  Administered 2017-04-05: 15:00:00 via INTRAVENOUS
  Filled 2017-04-05: qty 1000

## 2017-04-05 MED ORDER — FERUMOXYTOL INJECTION 510 MG/17 ML
INTRAVENOUS | Status: AC
  Filled 2017-04-05: qty 17

## 2017-04-07 ENCOUNTER — Encounter: Payer: Self-pay | Admitting: Obstetrics and Gynecology

## 2017-04-07 ENCOUNTER — Ambulatory Visit (INDEPENDENT_AMBULATORY_CARE_PROVIDER_SITE_OTHER): Payer: 59 | Admitting: Obstetrics and Gynecology

## 2017-04-07 VITALS — BP 136/88 | HR 100 | Wt 163.0 lb

## 2017-04-07 DIAGNOSIS — N939 Abnormal uterine and vaginal bleeding, unspecified: Secondary | ICD-10-CM

## 2017-04-07 NOTE — Progress Notes (Signed)
Obstetrics & Gynecology Office Visit   Chief Complaint:  Chief Complaint  Patient presents with  . Follow-up    Adenomyosis    History of Present Illness: 51 year old patient with a history of prior endometrial ablation who over the past few years has resumed menstruation with increased dysmenorrhea.  At the time of last visit we discussed management options including hormonal management vs proceeding with hysterectomy as her endometrial cavity is likely scarred which is contributing to her dysmenorrhea (noted on hysteroscopy 2016, normal pathology).  Ultrasound in May revealed normal appearing uterus but possibility of adenomyosis.  The patient opted for hormonal management with norethindrone.  She has discontinued this secondary to perceived weight gain and continued to have no menses now since April of this year.  She has noted some increased vasomotor symptoms the intervening period of time.  No abdominal pain, no dyspareunia.     Review of Systems: 10 pointe review of systems negative unless otherwise noted in HPI  Past Medical History:  Past Medical History:  Diagnosis Date  . Allergy   . Bartholin cyst 1990  . Basal cell carcinoma   . GERD (gastroesophageal reflux disease)   . High cholesterol   . History of bladder infections   . History of kidney infection   . History of kidney stones   . Meningitis    Age 51  . Renal disorder   . Seizures ([place])     Past Surgical History:  Past Surgical History:  Procedure Laterality Date  . ANTERIOR AND POSTERIOR VAGINAL REPAIR     [facility]  . BLADDER SURGERY    . CHOLECYSTECTOMY  2011  . COMBINED HYSTEROSCOPY DIAGNOSTIC / D&C  12/06/2014   [facility]  . ENDOMETRIAL ABLATION  2011   [facility]  Dr. Amalia Hailey  . FOOT SURGERY Left   . GANGLION CYST EXCISION    . GASTRIC BYPASS  2011  . INCONTINENCE SURGERY    . RECTAL SURGERY    . SEPTOPLASTY    . TONSILLECTOMY      Gynecologic History: No LMP  recorded.  Obstetric History: G2P2  Family History:  Family History  Problem Relation Age of Onset  . Bladder Cancer Mother   . Prostate cancer Father   . Melanoma Father   . Breast cancer Cousin     Social History:  Social History   Social History  . Marital status: Legally Separated    Spouse name: N/A  . Number of children: N/A  . Years of education: N/A   Occupational History  . Not on file.   Social History Main Topics  . Smoking status: Never Smoker  . Smokeless tobacco: Never Used  . Alcohol use No  . Drug use: Unknown  . Sexual activity: No   Other Topics Concern  . Not on file   Social History Narrative  . No narrative on file    Allergies:  Allergies  Allergen Reactions  . Latex   . Penicillins   . Sulfa Antibiotics     Medications: Prior to Admission medications   Medication Sig Start Date End Date Taking? Authorizing Provider  Calcium-Vitamin D 600-200 MG-UNIT tablet Take by mouth. 10/12/11  Yes [provider]  cetirizine (ZYRTEC) 10 MG tablet Take 10 mg by mouth daily.   Yes [provider]  Cholecalciferol (VITAMIN D3) 5000 units TABS Frequency:QD   Dosage:0.0     Instructions:  Note:Dose: ? 10/12/11  Yes [provider]  dexlansoprazole (DEXILANT) 60 MG capsule Take 60 mg by mouth daily.   Yes [provider]  lamoTRIgine (LAMICTAL) 200 MG tablet Take 200 mg by mouth 2 (two) times daily.    Yes [provider]  Multiple Vitamin (MULTIVITAMIN) tablet Take 1 tablet by mouth daily.   Yes [provider]  vitamin B-12 (CYANOCOBALAMIN) 1000 MCG tablet Frequency:QD   Dosage:0.0     Instructions:  Note:Dose: ? 10/12/11  Yes [provider]    Physical Exam Vitals:  Vitals:   04/07/17 1031  BP: 136/88  Pulse: 100   No LMP recorded.  General: NAD HEENT: normocephalic, anicteric Pulmonary: No increased work of breathing Extremities: no edema, erythema, or tenderness Neurologic:  Grossly intact Psychiatric: mood appropriate, affect full  Female chaperone present for pelvic and breast  portions of the physical exam  Assessment: 51 y.o. G2P2 follow up for abnormal uterine bleeding  Plan: Problem List Items Addressed This Visit    None    Visit Diagnoses    Abnormal uterine bleeding    -  Primary      -Discontinued norethindrone no menses since April -Opts for expectant managmeent -If bleeding return discussed 10 day cours of provera, she has noted more vasomotor symptoms, so given age suspect menopausal

## 2017-04-08 ENCOUNTER — Ambulatory Visit: Payer: 59 | Admitting: Obstetrics and Gynecology

## 2017-04-29 ENCOUNTER — Inpatient Hospital Stay: Payer: 59

## 2017-04-29 ENCOUNTER — Telehealth: Payer: Self-pay | Admitting: Oncology

## 2017-04-29 ENCOUNTER — Inpatient Hospital Stay (HOSPITAL_BASED_OUTPATIENT_CLINIC_OR_DEPARTMENT_OTHER): Payer: 59 | Admitting: Oncology

## 2017-04-29 ENCOUNTER — Encounter: Payer: Self-pay | Admitting: Oncology

## 2017-04-29 VITALS — BP 143/87 | HR 79 | Temp 98.3°F | Wt 172.0 lb

## 2017-04-29 DIAGNOSIS — Z9884 Bariatric surgery status: Secondary | ICD-10-CM

## 2017-04-29 DIAGNOSIS — Z9049 Acquired absence of other specified parts of digestive tract: Secondary | ICD-10-CM

## 2017-04-29 DIAGNOSIS — N6320 Unspecified lump in the left breast, unspecified quadrant: Secondary | ICD-10-CM

## 2017-04-29 DIAGNOSIS — R140 Abdominal distension (gaseous): Secondary | ICD-10-CM | POA: Diagnosis not present

## 2017-04-29 DIAGNOSIS — R635 Abnormal weight gain: Secondary | ICD-10-CM

## 2017-04-29 DIAGNOSIS — Z8619 Personal history of other infectious and parasitic diseases: Secondary | ICD-10-CM

## 2017-04-29 DIAGNOSIS — G40909 Epilepsy, unspecified, not intractable, without status epilepticus: Secondary | ICD-10-CM | POA: Diagnosis not present

## 2017-04-29 DIAGNOSIS — E7800 Pure hypercholesterolemia, unspecified: Secondary | ICD-10-CM | POA: Diagnosis not present

## 2017-04-29 DIAGNOSIS — Z85828 Personal history of other malignant neoplasm of skin: Secondary | ICD-10-CM | POA: Diagnosis not present

## 2017-04-29 DIAGNOSIS — Z87440 Personal history of urinary (tract) infections: Secondary | ICD-10-CM

## 2017-04-29 DIAGNOSIS — Z87442 Personal history of urinary calculi: Secondary | ICD-10-CM

## 2017-04-29 DIAGNOSIS — Z79899 Other long term (current) drug therapy: Secondary | ICD-10-CM | POA: Diagnosis not present

## 2017-04-29 DIAGNOSIS — Z8052 Family history of malignant neoplasm of bladder: Secondary | ICD-10-CM

## 2017-04-29 DIAGNOSIS — K219 Gastro-esophageal reflux disease without esophagitis: Secondary | ICD-10-CM

## 2017-04-29 DIAGNOSIS — R030 Elevated blood-pressure reading, without diagnosis of hypertension: Secondary | ICD-10-CM

## 2017-04-29 DIAGNOSIS — D509 Iron deficiency anemia, unspecified: Secondary | ICD-10-CM | POA: Diagnosis not present

## 2017-04-29 DIAGNOSIS — Z803 Family history of malignant neoplasm of breast: Secondary | ICD-10-CM

## 2017-04-29 DIAGNOSIS — Z8042 Family history of malignant neoplasm of prostate: Secondary | ICD-10-CM

## 2017-04-29 LAB — COMPREHENSIVE METABOLIC PANEL
ALBUMIN: 4 g/dL (ref 3.5–5.0)
ALK PHOS: 99 U/L (ref 38–126)
ALT: 12 U/L — ABNORMAL LOW (ref 14–54)
AST: 20 U/L (ref 15–41)
Anion gap: 7 (ref 5–15)
BILIRUBIN TOTAL: 0.6 mg/dL (ref 0.3–1.2)
BUN: 9 mg/dL (ref 6–20)
CALCIUM: 8.9 mg/dL (ref 8.9–10.3)
CO2: 28 mmol/L (ref 22–32)
Chloride: 105 mmol/L (ref 101–111)
Creatinine, Ser: 0.74 mg/dL (ref 0.44–1.00)
GFR calc Af Amer: >60 mL/min (ref >60)
GFR calc non Af Amer: >60 mL/min (ref >60)
GLUCOSE: 85 mg/dL (ref 65–99)
Potassium: 3.7 mmol/L (ref 3.5–5.1)
Sodium: 140 mmol/L (ref 135–145)
TOTAL PROTEIN: 7 g/dL (ref 6.5–8.1)

## 2017-04-29 LAB — IRON AND TIBC
IRON: 111 ug/dL (ref 28–170)
Saturation Ratios: 35 % — ABNORMAL HIGH (ref 10.4–31.8)
TIBC: 315 ug/dL (ref 250–450)
UIBC: 204 ug/dL

## 2017-04-29 LAB — CBC WITH DIFFERENTIAL/PLATELET
BASOS ABS: 0.1 10*3/uL (ref 0–0.1)
BASOS PCT: 1 %
EOS ABS: 0.2 10*3/uL (ref 0–0.7)
Eosinophils Relative: 3 %
HEMATOCRIT: 39.1 % (ref 35.0–47.0)
Hemoglobin: 12.8 g/dL (ref 12.0–16.0)
Lymphocytes Relative: 23 %
Lymphs Abs: 1.6 10*3/uL (ref 1.0–3.6)
MCH: 27 pg (ref 26.0–34.0)
MCHC: 32.9 g/dL (ref 32.0–36.0)
MCV: 82 fL (ref 80.0–100.0)
MONO ABS: 0.6 10*3/uL (ref 0.2–0.9)
Monocytes Relative: 9 %
NEUTROS ABS: 4.6 10*3/uL (ref 1.4–6.5)
Neutrophils Relative %: 64 %
PLATELETS: 280 10*3/uL (ref 150–440)
RBC: 4.76 MIL/uL (ref 3.80–5.20)
RDW: 22.7 % — AB (ref 11.5–14.5)
WBC: 7.1 10*3/uL (ref 3.6–11.0)

## 2017-04-29 LAB — FERRITIN: FERRITIN: 63 ng/mL (ref 11–307)

## 2017-04-29 LAB — TSH: TSH: 2.287 u[IU]/mL (ref 0.350–4.500)

## 2017-04-29 NOTE — Progress Notes (Signed)
Patient here today for follow up.  Patient c/o recent weight gain of 11 lbs (by her scale at home) with upper epigastric pain/bloating.

## 2017-04-29 NOTE — Telephone Encounter (Signed)
Called patient about today's results.

## 2017-04-29 NOTE — Telephone Encounter (Signed)
Called patient and communicated with her about today's lab results

## 2017-04-29 NOTE — Progress Notes (Signed)
[facility] Consultation Note:  Patient Care Team: Gayland Curry, MD as PCP - General (Family Medicine)  CHIEF COMPLAINTS/PURPOSE OF CONSULTATION: My iron level is low.  HISTORY OF PRESENTING ILLNESS: Elizabeth Good 51 y.o. female with PMH list as below is referred by Dr.Aldridge for evaluation and management of iron deficiency anemia.  She has a history of iron deficiency anemia and history of gastric bypass in 2011. She has been taken oral iron supplementation ferrous sulfate until recently she stopped taking due to GI side effects. She reports feeling fatigued all the time. Her last menstrual period was in April. It was usually having. She has a referral to obtain screening colonoscopy.  Today she denies chest pain shortness of breath, abdominal pain, lower extremity swelling.  INTERVAL HISTORY during interval patient received IV iron with Feraheme 510 mg IV for 2 doses.  she has an appointment next week. She has called complaining about weight gain, bloating,  and asking if this is a side effect of IV iron. Patient appointment was moved today for discussion. She reports feeling bloating, weight gain of more than 10 pounds within the past 4 weeks. She denies SOB.     Review of Systems  Constitutional: Negative.   HENT:  Negative.   Eyes: Negative.   Respiratory: Negative.   Cardiovascular: Negative.   Gastrointestinal: Negative.   Endocrine: Negative.   Genitourinary: Negative.    Musculoskeletal: Negative.   Skin: Negative.   Neurological: Negative.   Hematological: Negative.   Psychiatric/Behavioral: Negative.     MEDICAL HISTORY: Past Medical History:  Diagnosis Date  . Allergy   . Bartholin cyst 1990  . Basal cell carcinoma   . GERD (gastroesophageal reflux disease)   . High cholesterol   . History of bladder infections   . History of kidney infection   . History of kidney stones   . Meningitis    Age 18  . Renal disorder   .  Seizures ([place])     SURGICAL HISTORY: Past Surgical History:  Procedure Laterality Date  . ANTERIOR AND POSTERIOR VAGINAL REPAIR     [facility]  . BLADDER SURGERY    . CHOLECYSTECTOMY  2011  . COMBINED HYSTEROSCOPY DIAGNOSTIC / D&C  12/06/2014   [facility]  . ENDOMETRIAL ABLATION  2011   [facility]  Dr. Amalia Hailey  . FOOT SURGERY Left   . GANGLION CYST EXCISION    . GASTRIC BYPASS  2011  . INCONTINENCE SURGERY    . RECTAL SURGERY    . SEPTOPLASTY    . TONSILLECTOMY      SOCIAL HISTORY: Social History   Social History  . Marital status: Legally Separated    Spouse name: N/A  . Number of children: N/A  . Years of education: N/A   Occupational History  . Not on file.   Social History Main Topics  . Smoking status: Never Smoker  . Smokeless tobacco: Never Used  . Alcohol use No  . Drug use: Unknown  . Sexual activity: No   Other Topics Concern  . Not on file   Social History Narrative  . No narrative on file    FAMILY HISTORY Family History  Problem Relation Age of Onset  . Bladder Cancer Mother   . Prostate cancer Father   . Melanoma Father   . Breast cancer Cousin     ALLERGIES:  is allergic to latex; penicillins; and sulfa antibiotics.  MEDICATIONS:  Current Outpatient Prescriptions  Medication Sig  Dispense Refill  . Calcium-Vitamin D 600-200 MG-UNIT tablet Take by mouth.    . cetirizine (ZYRTEC) 10 MG tablet Take 10 mg by mouth daily.    . Cholecalciferol (VITAMIN D3) 5000 units TABS Frequency:QD   Dosage:0.0     Instructions:  Note:Dose: ?    . dexlansoprazole (DEXILANT) 60 MG capsule Take 60 mg by mouth daily.    Marland Kitchen lamoTRIgine (LAMICTAL) 200 MG tablet Take 200 mg by mouth 2 (two) times daily.     . Multiple Vitamin (MULTIVITAMIN) tablet Take 1 tablet by mouth daily.    . vitamin B-12 (CYANOCOBALAMIN) 1000 MCG tablet Frequency:QD   Dosage:0.0     Instructions:  Note:Dose: ?     No current facility-administered medications for this visit.      PHYSICAL EXAMINATION:  ECOG PERFORMANCE STATUS: 1 - Symptomatic but completely ambulatory   Vitals:   04/29/17 0849  BP: (!) 143/87  Pulse: 79  Temp: 98.3 F (36.8 C)    Filed Weights   04/29/17 0849  Weight: 171 lb 15.3 oz (78 kg)     Physical Exam GENERAL: No distress, well nourished.  SKIN:  No rashes or significant lesions  HEAD: Normocephalic, No masses, lesions, tenderness or abnormalities  EYES: non icteric ENT: External ears normal ,lips , buccal mucosa, and tongue normal and mucous membranes are moist  LYMPH: No palpable cervical and axillary lymphadenopathy  LUNGS: Clear to auscultation, no crackles or wheezes HEART: Regular rate & rhythm, no murmurs, no gallops, S1 normal and S2 normal  ABDOMEN: Abdomen soft, non-tender, normal bowel sounds, I did not appreciate any  masses or organomegaly  MUSCULOSKELETAL: No CVA tenderness and no tenderness on percussion of the back or rib cage.  EXTREMITIES: Bilateral lower extremity edema, +1, no skin discoloration or tenderness NEURO: Alert & oriented, no focal motor/sensory deficits.   LABORATORY DATA: I have personally reviewed the data as listed: Labs from [facility] system.  Her recent labs done on 03/08/2017 showed iron 37, TIBC 440, saturation 8, ferritin 6. Wbc 5.9, Hb 11.8, Hct 37.6, platelet 410, MCV 76, RDW 14.8, normal differential. B12 635, Folate 13.2, Vitamin D 51, TSH 2.06. Cr 0.8, Bilirubin 0.5, ALT 13, AST 23. TSH 2.06  CBC    Component Value Date/Time   WBC 6.7 11/21/2014 1327   RBC 4.34 11/21/2014 1327   HGB 11.1 (L) 11/21/2014 1327   HCT 34.9 (L) 11/21/2014 1327   PLT 275 11/21/2014 1327   MCV 80 11/21/2014 1327   MCH 25.5 (L) 11/21/2014 1327   MCHC 31.8 (L) 11/21/2014 1327   RDW 14.5 11/21/2014 1327    RADIOGRAPHIC STUDIES: I have personally reviewed the radiological images as listed and agree with the findings in the report Mammogram 03/06/2016 IMPRESSION: 1. The palpable mass in the  inferior left breast corresponds with a benign stable hamartoma. 2.  No mammographic evidence of malignancy in the bilateral breasts.  ASSESSMENT/PLAN  1. Iron deficiency anemia, unspecified iron deficiency anemia type   2. H/O gastric bypass   3. Weight gain   4. Elevated blood pressure reading   5. Abdominal bloating    # iron deficiency Anemia is likely due to malabsorption from gastric bypass. s/p IV iron with Feraheme 510 mg weekly 2 doses. Discussed with patient that it appears that she has gained about 10 pounds since last visit. Thyroid function was previously checked and was normal. Will recheck it today. Possibly related to hormonal changes perimenopausal period, or from lower extremity edema.  #  Bloating: unknown etiology: she had US transvaginal this year Feraheme can rarely cause edema (1.5%). Need to rule out cardiac etiology. Check 2D echo.  # Elevated HTN: one time reading. Will need to confirm for diagnosis of HTN.  Follow up in 2 weeks.  All questions were answered. The patient knows to call the clinic with any problems, questions or concerns. Thank you for this kind referral and the opportunity to participate in the care of this patient. A copy of today's note will be routed to referring physician Dr.Aldridge.  Earlie Server, MD  04/29/2017 8:40 AM

## 2017-05-04 ENCOUNTER — Other Ambulatory Visit: Payer: Self-pay

## 2017-05-06 ENCOUNTER — Ambulatory Visit: Payer: Self-pay

## 2017-05-06 ENCOUNTER — Ambulatory Visit: Payer: Self-pay | Admitting: Oncology

## 2017-05-10 ENCOUNTER — Ambulatory Visit
Admission: RE | Admit: 2017-05-10 | Discharge: 2017-05-10 | Disposition: A | Discharge status: Home / Self Care | Payer: 59 | Source: Ambulatory Visit | Attending: Oncology | Admitting: Oncology

## 2017-05-10 DIAGNOSIS — N289 Disorder of kidney and ureter, unspecified: Secondary | ICD-10-CM | POA: Insufficient documentation

## 2017-05-10 DIAGNOSIS — Z9884 Bariatric surgery status: Secondary | ICD-10-CM | POA: Insufficient documentation

## 2017-05-10 DIAGNOSIS — R030 Elevated blood-pressure reading, without diagnosis of hypertension: Secondary | ICD-10-CM | POA: Insufficient documentation

## 2017-05-10 DIAGNOSIS — R140 Abdominal distension (gaseous): Secondary | ICD-10-CM | POA: Insufficient documentation

## 2017-05-10 DIAGNOSIS — E7800 Pure hypercholesterolemia, unspecified: Secondary | ICD-10-CM | POA: Diagnosis not present

## 2017-05-10 DIAGNOSIS — R457 State of emotional shock and stress, unspecified: Secondary | ICD-10-CM | POA: Diagnosis not present

## 2017-05-10 DIAGNOSIS — R569 Unspecified convulsions: Secondary | ICD-10-CM | POA: Insufficient documentation

## 2017-05-10 DIAGNOSIS — K219 Gastro-esophageal reflux disease without esophagitis: Secondary | ICD-10-CM | POA: Diagnosis not present

## 2017-05-10 DIAGNOSIS — R635 Abnormal weight gain: Secondary | ICD-10-CM | POA: Diagnosis not present

## 2017-05-10 DIAGNOSIS — D509 Iron deficiency anemia, unspecified: Secondary | ICD-10-CM | POA: Insufficient documentation

## 2017-05-10 DIAGNOSIS — Z1231 Encounter for screening mammogram for malignant neoplasm of breast: Secondary | ICD-10-CM | POA: Diagnosis not present

## 2017-05-10 NOTE — Progress Notes (Signed)
*  PRELIMINARY RESULTS* Echocardiogram 2D Echocardiogram has been performed.  Elizabeth Good 05/10/2017, 10:40 AM

## 2017-05-11 ENCOUNTER — Telehealth: Payer: Self-pay | Admitting: Oncology

## 2017-05-11 NOTE — Telephone Encounter (Signed)
Left message to patient's cell phone that echo result is normal.

## 2017-05-13 ENCOUNTER — Inpatient Hospital Stay: Payer: 59 | Attending: Oncology | Admitting: Oncology

## 2017-05-13 ENCOUNTER — Encounter: Payer: Self-pay | Admitting: Oncology

## 2017-05-13 VITALS — BP 128/84 | HR 98 | Temp 97.9°F | Wt 168.3 lb

## 2017-05-13 DIAGNOSIS — Z85828 Personal history of other malignant neoplasm of skin: Secondary | ICD-10-CM | POA: Insufficient documentation

## 2017-05-13 DIAGNOSIS — R5383 Other fatigue: Secondary | ICD-10-CM | POA: Diagnosis not present

## 2017-05-13 DIAGNOSIS — Z87440 Personal history of urinary (tract) infections: Secondary | ICD-10-CM | POA: Diagnosis not present

## 2017-05-13 DIAGNOSIS — K219 Gastro-esophageal reflux disease without esophagitis: Secondary | ICD-10-CM | POA: Diagnosis not present

## 2017-05-13 DIAGNOSIS — D509 Iron deficiency anemia, unspecified: Secondary | ICD-10-CM | POA: Diagnosis not present

## 2017-05-13 DIAGNOSIS — Z8041 Family history of malignant neoplasm of ovary: Secondary | ICD-10-CM | POA: Insufficient documentation

## 2017-05-13 DIAGNOSIS — Z800 Family history of malignant neoplasm of digestive organs: Secondary | ICD-10-CM | POA: Insufficient documentation

## 2017-05-13 DIAGNOSIS — R030 Elevated blood-pressure reading, without diagnosis of hypertension: Secondary | ICD-10-CM

## 2017-05-13 DIAGNOSIS — Z9884 Bariatric surgery status: Secondary | ICD-10-CM | POA: Diagnosis not present

## 2017-05-13 DIAGNOSIS — R635 Abnormal weight gain: Secondary | ICD-10-CM | POA: Diagnosis not present

## 2017-05-13 DIAGNOSIS — Z79899 Other long term (current) drug therapy: Secondary | ICD-10-CM | POA: Diagnosis not present

## 2017-05-13 DIAGNOSIS — N920 Excessive and frequent menstruation with regular cycle: Secondary | ICD-10-CM | POA: Insufficient documentation

## 2017-05-13 DIAGNOSIS — Z8619 Personal history of other infectious and parasitic diseases: Secondary | ICD-10-CM | POA: Diagnosis not present

## 2017-05-13 DIAGNOSIS — Z87442 Personal history of urinary calculi: Secondary | ICD-10-CM | POA: Diagnosis not present

## 2017-05-13 DIAGNOSIS — R140 Abdominal distension (gaseous): Secondary | ICD-10-CM | POA: Insufficient documentation

## 2017-05-13 DIAGNOSIS — G40909 Epilepsy, unspecified, not intractable, without status epilepticus: Secondary | ICD-10-CM | POA: Diagnosis not present

## 2017-05-13 DIAGNOSIS — E7800 Pure hypercholesterolemia, unspecified: Secondary | ICD-10-CM | POA: Diagnosis not present

## 2017-05-13 DIAGNOSIS — Z803 Family history of malignant neoplasm of breast: Secondary | ICD-10-CM | POA: Diagnosis not present

## 2017-05-13 DIAGNOSIS — R634 Abnormal weight loss: Secondary | ICD-10-CM | POA: Diagnosis not present

## 2017-05-13 NOTE — Progress Notes (Signed)
[facility] Follow Up Note:  Patient Care Team: Gayland Curry, MD as PCP - General (Family Medicine)  CHIEF COMPLAINTS/PURPOSE OF CONSULTATION: My iron level is low.  HISTORY OF PRESENTING ILLNESS: Elizabeth Good 51 y.o. female with PMH list as below is referred by Dr.Aldridge for evaluation and management of iron deficiency anemia.  She has a history of iron deficiency anemia and history of gastric bypass in 2011. She has been taken oral iron supplementation ferrous sulfate until recently she stopped taking due to GI side effects. She reports feeling fatigued all the time. Her last menstrual period was in April. It was usually having. She has a referral to obtain screening colonoscopy.   S/p IV iron with Feraheme 510 mg IV for 2 doses in July/August. Had unexplained 10 pounds weight gain, bloating. Normal TSH, normal 2D echo   INTERVAL HISTORY Today she denies chest pain shortness of breath, abdominal pain. She has trace lower extremity swelling. She feels better. She had a heavy menstrual period after months of amenorrhea and follows up with GYN. Bloating is better. Weight has decreased 3 pounds from last visit.    Review of Systems  Constitutional: Negative.   HENT:  Negative.   Eyes: Negative.   Respiratory: Negative.   Cardiovascular: Negative.   Gastrointestinal: Negative.   Endocrine: Negative.   Genitourinary: Negative.    Musculoskeletal: Negative.   Skin: Negative.   Neurological: Negative.   Hematological: Negative.   Psychiatric/Behavioral: Negative.     MEDICAL HISTORY: Past Medical History:  Diagnosis Date  . Allergy   . Bartholin cyst 1990  . Basal cell carcinoma   . GERD (gastroesophageal reflux disease)   . High cholesterol   . History of bladder infections   . History of kidney infection   . History of kidney stones   . Meningitis    Age 44  . Renal disorder   . Seizures ([place])     SURGICAL HISTORY: Past Surgical  History:  Procedure Laterality Date  . ANTERIOR AND POSTERIOR VAGINAL REPAIR     [facility]  . BLADDER SURGERY    . CHOLECYSTECTOMY  2011  . COMBINED HYSTEROSCOPY DIAGNOSTIC / D&C  12/06/2014   [facility]  . ENDOMETRIAL ABLATION  2011   [facility]  Dr. Amalia Hailey  . FOOT SURGERY Left   . GANGLION CYST EXCISION    . GASTRIC BYPASS  2011  . INCONTINENCE SURGERY    . RECTAL SURGERY    . SEPTOPLASTY    . TONSILLECTOMY      SOCIAL HISTORY: Social History   Social History  . Marital status: Legally Separated    Spouse name: N/A  . Number of children: N/A  . Years of education: N/A   Occupational History  . Not on file.   Social History Main Topics  . Smoking status: Never Smoker  . Smokeless tobacco: Never Used  . Alcohol use No  . Drug use: Unknown  . Sexual activity: No   Other Topics Concern  . Not on file   Social History Narrative  . No narrative on file    FAMILY HISTORY Family History  Problem Relation Age of Onset  . Bladder Cancer Mother   . Prostate cancer Father   . Melanoma Father   . Breast cancer Cousin     ALLERGIES:  is allergic to latex; penicillins; and sulfa antibiotics.  MEDICATIONS:  Current Outpatient Prescriptions  Medication Sig Dispense Refill  . Calcium-Vitamin D 600-200 MG-UNIT  tablet Take by mouth.    . cetirizine (ZYRTEC) 10 MG tablet Take 10 mg by mouth daily.    . Cholecalciferol (VITAMIN D3) 5000 units TABS Frequency:QD   Dosage:0.0     Instructions:  Note:Dose: ?    . dexlansoprazole (DEXILANT) 60 MG capsule Take 60 mg by mouth daily.    Marland Kitchen lamoTRIgine (LAMICTAL) 200 MG tablet Take 200 mg by mouth 2 (two) times daily.     . Multiple Vitamin (MULTIVITAMIN) tablet Take 1 tablet by mouth daily.    . norethindrone (AYGESTIN) 5 MG tablet Take 5 mg by mouth daily.    . vitamin B-12 (CYANOCOBALAMIN) 1000 MCG tablet Frequency:QD   Dosage:0.0     Instructions:  Note:Dose: ?    . furosemide (LASIX) 20 MG tablet Take 20 mg by mouth  as needed.     No current facility-administered medications for this visit.     PHYSICAL EXAMINATION:  ECOG PERFORMANCE STATUS: 0 - Asymptomatic   Vitals:   05/13/17 0838  BP: 128/84  Pulse: 98  Temp: 97.9 F (36.6 C)    Filed Weights   05/13/17 0838  Weight: 168 lb 5.1 oz (76.4 kg)     Physical Exam GENERAL: No distress, well nourished.  SKIN:  No rashes or significant lesions  HEAD: Normocephalic, No masses, lesions, tenderness or abnormalities  EYES: non icteric ENT: External ears normal ,lips , buccal mucosa, and tongue normal and mucous membranes are moist  LYMPH: No palpable cervical and axillary lymphadenopathy  LUNGS: Clear to auscultation, no crackles or wheezes HEART: Regular rate & rhythm, no murmurs, no gallops, S1 normal and S2 normal  ABDOMEN: Abdomen soft, non-tender, normal bowel sounds, I did not appreciate any  masses or organomegaly  MUSCULOSKELETAL: No CVA tenderness and no tenderness on percussion of the back or rib cage.  EXTREMITIES: trace lower extremity edema, no skin discoloration or tenderness NEURO: Alert & oriented, no focal motor/sensory deficits.   LABORATORY DATA: I have personally reviewed the data as listed: Labs from [facility] system.  Her recent labs done on 03/08/2017 showed iron 37, TIBC 440, saturation 8, ferritin 6. Wbc 5.9, Hb 11.8, Hct 37.6, platelet 410, MCV 76, RDW 14.8, normal differential. B12 635, Folate 13.2, Vitamin D 51, TSH 2.06. Cr 0.8, Bilirubin 0.5, ALT 13, AST 23. TSH 2.06  CBC    Component Value Date/Time   WBC 7.1 04/29/2017 0833   RBC 4.76 04/29/2017 0833   HGB 12.8 04/29/2017 0833   HGB 11.1 (L) 11/21/2014 1327   HCT 39.1 04/29/2017 0833   HCT 34.9 (L) 11/21/2014 1327   PLT 280 04/29/2017 0833   PLT 275 11/21/2014 1327   MCV 82.0 04/29/2017 0833   MCV 80 11/21/2014 1327   MCH 27.0 04/29/2017 0833   MCHC 32.9 04/29/2017 0833   RDW 22.7 (H) 04/29/2017 0833   RDW 14.5 11/21/2014 1327   LYMPHSABS 1.6  04/29/2017 0833   MONOABS 0.6 04/29/2017 0833   EOSABS 0.2 04/29/2017 0833   BASOSABS 0.1 04/29/2017 0833   Iron/TIBC/Ferritin/ %Sat    Component Value Date/Time   IRON 111 04/29/2017 0833   TIBC 315 04/29/2017 0833   FERRITIN 63 04/29/2017 0833   IRONPCTSAT 35 (H) 04/29/2017 0833   RADIOGRAPHIC STUDIES: I have personally reviewed the radiological images as listed and agree with the findings in the report Mammogram 03/06/2016 IMPRESSION: 1. 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 2.  No mammographic evidence of  malignancy in the bilateral breasts.  ASSESSMENT/PLAN  1. Iron deficiency anemia, unspecified iron deficiency anemia type   2. H/O gastric bypass   3. Weight gain   4. Elevated blood pressure reading   5. Abdominal bloating    # s/p IV iron with Feraheme 510 mg weekly 2 doses, good response hematologically.  # Weight gain, Possibly related to lower extremity edema caused by IV iron, and/or hormonal changes perimenopausal period. Normal thyroid function, 2D echo. Feraheme can rarely cause edema (1.5%). # Elevated HTN resolved today.   Follow up in 3 months with cbc, iron, TIBC done 1 week before visit. Possible additional IV iron if needed.   All questions were answered. The patient knows to call the clinic with any problems, questions or concerns. Thank you for this kind referral and the opportunity to participate in the care of this patient. A copy of today's note will be routed to referring physician Dr.Aldridge.  Earlie Server, MD  05/13/2017 8:48 AM

## 2017-05-23 ENCOUNTER — Emergency Department
Admission: EM | Admit: 2017-05-23 | Discharge: 2017-05-23 | Disposition: A | Discharge status: Home / Self Care | Payer: 59 | Attending: Emergency Medicine | Admitting: Emergency Medicine

## 2017-05-23 DIAGNOSIS — Z85828 Personal history of other malignant neoplasm of skin: Secondary | ICD-10-CM | POA: Diagnosis not present

## 2017-05-23 DIAGNOSIS — Y929 Unspecified place or not applicable: Secondary | ICD-10-CM | POA: Insufficient documentation

## 2017-05-23 DIAGNOSIS — Y93G3 Activity, cooking and baking: Secondary | ICD-10-CM | POA: Diagnosis not present

## 2017-05-23 DIAGNOSIS — Z79899 Other long term (current) drug therapy: Secondary | ICD-10-CM | POA: Diagnosis not present

## 2017-05-23 DIAGNOSIS — W268XXA Contact with other sharp object(s), not elsewhere classified, initial encounter: Secondary | ICD-10-CM | POA: Diagnosis not present

## 2017-05-23 DIAGNOSIS — S61011A Laceration without foreign body of right thumb without damage to nail, initial encounter: Secondary | ICD-10-CM | POA: Insufficient documentation

## 2017-05-23 DIAGNOSIS — Z23 Encounter for immunization: Secondary | ICD-10-CM | POA: Diagnosis not present

## 2017-05-23 DIAGNOSIS — Y998 Other external cause status: Secondary | ICD-10-CM | POA: Insufficient documentation

## 2017-05-23 MED ORDER — CEPHALEXIN 500 MG PO CAPS: 500.0000 mg | ORAL_CAPSULE | Freq: Four times a day (QID) | ORAL | 0 refills | Status: AC

## 2017-05-23 MED ORDER — LIDOCAINE HCL 1 % IJ SOLN: 5.0000 mL | Freq: Once | INTRAMUSCULAR | Status: DC

## 2017-05-23 MED ORDER — TETANUS-DIPHTH-ACELL PERTUSSIS 5-2.5-18.5 LF-MCG/0.5 IM SUSP
0.5000 mL | Freq: Once | INTRAMUSCULAR | Status: AC
  Administered 2017-05-23: 0.5 mL via INTRAMUSCULAR
  Filled 2017-05-23: qty 0.5

## 2017-05-23 MED ORDER — FLUCONAZOLE 150 MG PO TABS: 150.0000 mg | ORAL_TABLET | Freq: Every day | ORAL | 1 refills | Status: AC

## 2017-05-23 MED ORDER — LIDOCAINE HCL (PF) 1 % IJ SOLN
INTRAMUSCULAR | Status: AC
  Filled 2017-05-23: qty 5

## 2017-05-23 NOTE — ED Triage Notes (Signed)
Patient c/o laceration to right thumb from can of tomatoes. Patient took 2 motrin at 1800.

## 2017-05-23 NOTE — ED Notes (Signed)
Pt reports she lacerated right thumb while opening a can of tomatoes

## 2017-05-23 NOTE — ED Provider Notes (Signed)
[facility] Emergency Department Provider Note  ____________________________________________  Time seen: Approximately 9:57 PM  I have reviewed the triage vital signs and the nursing notes.   HISTORY  Chief Complaint Laceration (thumb, right hand)    HPI Elizabeth Good is a 50 y.o. female presenting to the emergency department with a 1 cm laceration of right thumb while opening a can of tomato sauce tonight. Patient denies weakness, radiculopathy or changes in sensation of the right hand. Hemostasis had been achieved prior to presenting to the emergency department. Patient cannot recall her last tetanus shot.   Past Medical History:  Diagnosis Date  . Allergy   . Bartholin cyst 1990  . Basal cell carcinoma   . GERD (gastroesophageal reflux disease)   . High cholesterol   . History of bladder infections   . History of kidney infection   . History of kidney stones   . Meningitis    Age 63  . Renal disorder   . Seizures [facility])     Patient Active Problem List   Diagnosis Date Noted  . Iron deficiency anemia 03/25/2017  . H/O gastric bypass 03/25/2017    Past Surgical History:  Procedure Laterality Date  . ANTERIOR AND POSTERIOR VAGINAL REPAIR     [facility]  . BLADDER SURGERY    . CHOLECYSTECTOMY  2011  . COMBINED HYSTEROSCOPY DIAGNOSTIC / D&C  12/06/2014   [facility]  . ENDOMETRIAL ABLATION  2011   [facility]  Dr. Amalia Hailey  . FOOT SURGERY Left   . GANGLION CYST EXCISION    . GASTRIC BYPASS  2011  . INCONTINENCE SURGERY    . RECTAL SURGERY    . SEPTOPLASTY    . TONSILLECTOMY      Prior to Admission medications   Medication Sig Start Date End Date Taking? Authorizing Provider  Calcium-Vitamin D 600-200 MG-UNIT tablet Take by mouth. 10/12/11   [provider]  cephALEXin (KEFLEX) 500 MG capsule Take 1 capsule (500 mg total) by mouth 4 (four) times daily. 05/23/17 06/02/17  Lannie Fields, PA-C  cetirizine (ZYRTEC) 10 MG  tablet Take 10 mg by mouth daily.    [provider]  Cholecalciferol (VITAMIN D3) 5000 units TABS Frequency:QD   Dosage:0.0     Instructions:  Note:Dose: ? 10/12/11   [provider]  dexlansoprazole (DEXILANT) 60 MG capsule Take 60 mg by mouth daily.    [provider]  fluconazole (DIFLUCAN) 150 MG tablet Take 1 tablet (150 mg total) by mouth daily. 05/23/17 05/24/17  Lannie Fields, PA-C  furosemide (LASIX) 20 MG tablet Take 20 mg by mouth as needed. 05/10/17 05/10/18  [provider]  lamoTRIgine (LAMICTAL) 200 MG tablet Take 200 mg by mouth 2 (two) times daily.     [provider]  Multiple Vitamin (MULTIVITAMIN) tablet Take 1 tablet by mouth daily.    [provider]  norethindrone (AYGESTIN) 5 MG tablet Take 5 mg by mouth daily.    [provider]  vitamin B-12 (CYANOCOBALAMIN) 1000 MCG tablet Frequency:QD   Dosage:0.0     Instructions:  Note:Dose: ? 10/12/11   [provider]    Allergies Latex; Penicillins; and Sulfa antibiotics  Family History  Problem Relation Age of Onset  . Bladder Cancer Mother   . Prostate cancer Father   . Melanoma Father   . Breast cancer Cousin     Social History Social History  Substance Use Topics  . Smoking status: Never Smoker  .  Smokeless tobacco: Never Used  . Alcohol use No     Review of Systems  Constitutional: No fever/chills Eyes: No visual changes. No discharge ENT: No upper respiratory complaints. Cardiovascular: no chest pain. Respiratory: no cough. No SOB. Musculoskeletal: Negative for musculoskeletal pain. Skin:Patient has right thumb laceration.  Neurological: Negative for headaches, focal weakness or numbness.   ____________________________________________   PHYSICAL EXAM:  VITAL SIGNS: ED Triage Vitals  Enc Vitals Group     BP 05/23/17 1957 (!) 151/84     Pulse Rate 05/23/17 1957 95     Resp 05/23/17 1957 15     Temp 05/23/17 1957 98.4 F (36.9  C)     Temp Source 05/23/17 1957 Oral     SpO2 05/23/17 1957 100 %     Weight 05/23/17 1958 168 lb (76.2 kg)     Height --      Head Circumference --      Peak Flow --      Pain Score 05/23/17 1957 5     Pain Loc --      Pain Edu? --      Excl. in [place]? --      Constitutional: Alert and oriented. Well appearing and in no acute distress. Eyes: Conjunctivae are normal. PERRL. EOMI. Head: Atraumatic. Cardiovascular: Normal rate, regular rhythm. Normal S1 and S2.  Good peripheral circulation. Respiratory: Normal respiratory effort without tachypnea or retractions. Lungs CTAB. Good air entry to the bases with no decreased or absent breath sounds. Musculoskeletal: Full range of motion to all extremities. No gross deformities appreciated. Neurologic:  Normal speech and language. No gross focal neurologic deficits are appreciated.  Skin: Patient has 1 cm right thumb laceration. Psychiatric: Mood and affect are normal. Speech and behavior are normal. Patient exhibits appropriate insight and judgement.   ____________________________________________   LABS (all labs ordered are listed, but only abnormal results are displayed)  Labs Reviewed - No data to display ____________________________________________  EKG   ____________________________________________  RADIOLOGY  No results found.  ____________________________________________    PROCEDURES  Procedure(s) performed:    Procedures  LACERATION REPAIR Performed by: Lannie Fields Authorized by: Lannie Fields Consent: Verbal consent obtained. Risks and benefits: risks, benefits and alternatives were discussed Consent given by: patient Patient identity confirmed: provided demographic data Prepped and Draped in normal sterile fashion Wound explored  Laceration Location: Right thumb  Laceration Length: 1 cm  No Foreign Bodies seen or palpated  Anesthesia: local infiltration  Local anesthetic: lidocaine 1%  without epinephrine  Anesthetic total: 3 ml  Irrigation method: syringe Amount of cleaning: standard  Skin closure: 5-0 Ethilon   Number of sutures: 3  Technique: Simple Interrupted.   Patient tolerance: Patient tolerated the procedure well with no immediate complications.   Medications  lidocaine (XYLOCAINE) 1 % (with pres) injection 5 mL (not administered)  lidocaine (PF) (XYLOCAINE) 1 % injection (not administered)  Tdap (BOOSTRIX) injection 0.5 mL (not administered)     ____________________________________________   INITIAL IMPRESSION / ASSESSMENT AND PLAN / ED COURSE  Pertinent labs & imaging results that were available during my care of the patient were reviewed by me and considered in my medical decision making (see chart for details).  Review of the [place] CSRS was performed in accordance of the [employer] prior to dispensing any controlled drugs.     Assessment and plan Right thumb laceration Patient presents to the emergency department with a 1 cm right thumb laceration. Patient underwent laceration repair without  complication. Patient was advised to have sutures removed by primary care in 9 days. Patient was discharged with Keflex. All patients questions were answered.     ____________________________________________  FINAL CLINICAL IMPRESSION(S) / ED DIAGNOSES  Final diagnoses:  Laceration of right thumb without foreign body without damage to nail, initial encounter      NEW MEDICATIONS STARTED DURING THIS VISIT:  New Prescriptions   CEPHALEXIN (KEFLEX) 500 MG CAPSULE    Take 1 capsule (500 mg total) by mouth 4 (four) times daily.   FLUCONAZOLE (DIFLUCAN) 150 MG TABLET    Take 1 tablet (150 mg total) by mouth daily.        This chart was dictated using voice recognition software/Dragon. Despite best efforts to proofread, errors can occur which can change the meaning. Any change was purely unintentional.    Lannie Fields, PA-C 05/23/17 2349     Carrie Mew, MD 05/27/17 1530

## 2017-05-28 ENCOUNTER — Ambulatory Visit (INDEPENDENT_AMBULATORY_CARE_PROVIDER_SITE_OTHER): Payer: PRIVATE HEALTH INSURANCE

## 2017-05-28 ENCOUNTER — Ambulatory Visit
Admission: EM | Admit: 2017-05-28 | Discharge: 2017-05-28 | Disposition: A | Discharge status: Home / Self Care | Payer: 59 | Attending: Family Medicine | Admitting: Family Medicine

## 2017-05-28 DIAGNOSIS — S6991XA Unspecified injury of right wrist, hand and finger(s), initial encounter: Secondary | ICD-10-CM | POA: Diagnosis not present

## 2017-05-28 DIAGNOSIS — M25531 Pain in right wrist: Secondary | ICD-10-CM | POA: Diagnosis not present

## 2017-05-28 DIAGNOSIS — S63501A Unspecified sprain of right wrist, initial encounter: Secondary | ICD-10-CM

## 2017-05-28 MED ORDER — HYDROCODONE-ACETAMINOPHEN 5-325 MG PO TABS: 1.0000 | ORAL_TABLET | Freq: Four times a day (QID) | ORAL | 0 refills | Status: DC | PRN
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
## 2017-06-04 DIAGNOSIS — M25531 Pain in right wrist: Secondary | ICD-10-CM | POA: Diagnosis not present

## 2017-06-18 DIAGNOSIS — M25531 Pain in right wrist: Secondary | ICD-10-CM | POA: Diagnosis not present

## 2017-06-24 ENCOUNTER — Other Ambulatory Visit: Payer: Self-pay | Admitting: Orthopedic Surgery

## 2017-06-24 DIAGNOSIS — M25531 Pain in right wrist: Secondary | ICD-10-CM

## 2017-07-01 ENCOUNTER — Ambulatory Visit: Admission: RE | Admit: 2017-07-01 | Payer: 59 | Source: Ambulatory Visit

## 2017-07-01 ENCOUNTER — Telehealth: Payer: Self-pay | Admitting: *Deleted

## 2017-07-01 ENCOUNTER — Ambulatory Visit: Payer: 59

## 2017-07-02 ENCOUNTER — Ambulatory Visit
Admission: RE | Admit: 2017-07-02 | Discharge: 2017-07-02 | Disposition: A | Discharge status: Home / Self Care | Payer: 59 | Source: Ambulatory Visit | Attending: Orthopedic Surgery | Admitting: Orthopedic Surgery

## 2017-07-02 DIAGNOSIS — M25531 Pain in right wrist: Secondary | ICD-10-CM

## 2017-07-02 DIAGNOSIS — M25431 Effusion, right wrist: Secondary | ICD-10-CM | POA: Diagnosis not present

## 2017-07-02 MED ORDER — IOPAMIDOL (ISOVUE-300) INJECTION 61%
5.0000 mL | Freq: Once | INTRAVENOUS | Status: AC | PRN
  Administered 2017-07-02: 1.5 mL via INTRA_ARTERIAL

## 2017-07-02 MED ORDER — GADOBENATE DIMEGLUMINE 529 MG/ML IV SOLN
0.5000 mL | Freq: Once | INTRAVENOUS | Status: AC | PRN
  Administered 2017-07-02: 0.05 mL via INTRA_ARTICULAR

## 2017-07-02 MED ORDER — LIDOCAINE HCL (PF) 1 % IJ SOLN
5.0000 mL | Freq: Once | INTRAMUSCULAR | Status: AC
  Administered 2017-07-02: 5 mL
  Filled 2017-07-02: qty 5

## 2017-07-06 DIAGNOSIS — M25531 Pain in right wrist: Secondary | ICD-10-CM | POA: Diagnosis not present

## 2017-07-12 DIAGNOSIS — R635 Abnormal weight gain: Secondary | ICD-10-CM | POA: Diagnosis not present

## 2017-07-12 DIAGNOSIS — Z1211 Encounter for screening for malignant neoplasm of colon: Secondary | ICD-10-CM | POA: Diagnosis not present

## 2017-07-12 DIAGNOSIS — R300 Dysuria: Secondary | ICD-10-CM | POA: Diagnosis not present

## 2017-07-13 DIAGNOSIS — M25531 Pain in right wrist: Secondary | ICD-10-CM | POA: Diagnosis not present

## 2017-07-13 DIAGNOSIS — M25631 Stiffness of right wrist, not elsewhere classified: Secondary | ICD-10-CM | POA: Diagnosis not present

## 2017-07-20 DIAGNOSIS — M25631 Stiffness of right wrist, not elsewhere classified: Secondary | ICD-10-CM | POA: Diagnosis not present

## 2017-07-20 DIAGNOSIS — M25531 Pain in right wrist: Secondary | ICD-10-CM | POA: Diagnosis not present

## 2017-07-27 DIAGNOSIS — M25531 Pain in right wrist: Secondary | ICD-10-CM | POA: Diagnosis not present

## 2017-07-27 DIAGNOSIS — M25631 Stiffness of right wrist, not elsewhere classified: Secondary | ICD-10-CM | POA: Diagnosis not present

## 2017-07-29 ENCOUNTER — Inpatient Hospital Stay: Payer: 59 | Attending: Oncology

## 2017-07-29 DIAGNOSIS — D509 Iron deficiency anemia, unspecified: Secondary | ICD-10-CM | POA: Insufficient documentation

## 2017-07-29 DIAGNOSIS — R140 Abdominal distension (gaseous): Secondary | ICD-10-CM

## 2017-07-29 DIAGNOSIS — R635 Abnormal weight gain: Secondary | ICD-10-CM

## 2017-07-29 DIAGNOSIS — R030 Elevated blood-pressure reading, without diagnosis of hypertension: Secondary | ICD-10-CM

## 2017-07-29 DIAGNOSIS — Z9884 Bariatric surgery status: Secondary | ICD-10-CM

## 2017-07-29 LAB — IRON AND TIBC
IRON: 126 ug/dL (ref 28–170)
SATURATION RATIOS: 39 % — AB (ref 10.4–31.8)
TIBC: 326 ug/dL (ref 250–450)
UIBC: 201 ug/dL

## 2017-07-29 LAB — CBC WITH DIFFERENTIAL/PLATELET
Basophils Absolute: 0 10*3/uL (ref 0–0.1)
Basophils Relative: 1 %
EOS ABS: 0.2 10*3/uL (ref 0–0.7)
Eosinophils Relative: 3 %
HCT: 42.1 % (ref 35.0–47.0)
HEMOGLOBIN: 14.3 g/dL (ref 12.0–16.0)
LYMPHS ABS: 1.3 10*3/uL (ref 1.0–3.6)
Lymphocytes Relative: 17 %
MCH: 29.3 pg (ref 26.0–34.0)
MCHC: 33.9 g/dL (ref 32.0–36.0)
MCV: 86.4 fL (ref 80.0–100.0)
MONOS PCT: 7 %
Monocytes Absolute: 0.5 10*3/uL (ref 0.2–0.9)
NEUTROS PCT: 72 %
Neutro Abs: 5.4 10*3/uL (ref 1.4–6.5)
Platelets: 306 10*3/uL (ref 150–440)
RBC: 4.87 MIL/uL (ref 3.80–5.20)
RDW: 12.7 % (ref 11.5–14.5)
WBC: 7.5 10*3/uL (ref 3.6–11.0)

## 2017-07-29 LAB — FERRITIN: Ferritin: 41 ng/mL (ref 11–307)

## 2017-08-03 DIAGNOSIS — M25631 Stiffness of right wrist, not elsewhere classified: Secondary | ICD-10-CM | POA: Diagnosis not present

## 2017-08-03 DIAGNOSIS — M25531 Pain in right wrist: Secondary | ICD-10-CM | POA: Diagnosis not present

## 2017-08-05 ENCOUNTER — Encounter: Payer: Self-pay | Admitting: Oncology

## 2017-08-05 ENCOUNTER — Inpatient Hospital Stay: Payer: 59 | Attending: Oncology | Admitting: Oncology

## 2017-08-05 ENCOUNTER — Inpatient Hospital Stay: Payer: 59

## 2017-08-05 ENCOUNTER — Other Ambulatory Visit: Payer: Self-pay

## 2017-08-05 VITALS — BP 134/84 | HR 92 | Temp 97.5°F | Resp 16 | Wt 170.6 lb

## 2017-08-05 DIAGNOSIS — D509 Iron deficiency anemia, unspecified: Secondary | ICD-10-CM | POA: Diagnosis not present

## 2017-08-05 DIAGNOSIS — Q859 Phakomatosis, unspecified: Secondary | ICD-10-CM

## 2017-08-05 DIAGNOSIS — K219 Gastro-esophageal reflux disease without esophagitis: Secondary | ICD-10-CM

## 2017-08-05 DIAGNOSIS — Z8042 Family history of malignant neoplasm of prostate: Secondary | ICD-10-CM | POA: Diagnosis not present

## 2017-08-05 DIAGNOSIS — Z880 Allergy status to penicillin: Secondary | ICD-10-CM

## 2017-08-05 DIAGNOSIS — N6320 Unspecified lump in the left breast, unspecified quadrant: Secondary | ICD-10-CM

## 2017-08-05 DIAGNOSIS — Z8052 Family history of malignant neoplasm of bladder: Secondary | ICD-10-CM | POA: Diagnosis not present

## 2017-08-05 DIAGNOSIS — E7800 Pure hypercholesterolemia, unspecified: Secondary | ICD-10-CM

## 2017-08-05 DIAGNOSIS — Z9884 Bariatric surgery status: Secondary | ICD-10-CM

## 2017-08-05 DIAGNOSIS — Z87442 Personal history of urinary calculi: Secondary | ICD-10-CM

## 2017-08-05 DIAGNOSIS — Z79899 Other long term (current) drug therapy: Secondary | ICD-10-CM

## 2017-08-05 DIAGNOSIS — Z808 Family history of malignant neoplasm of other organs or systems: Secondary | ICD-10-CM

## 2017-08-05 DIAGNOSIS — Z85828 Personal history of other malignant neoplasm of skin: Secondary | ICD-10-CM | POA: Diagnosis not present

## 2017-08-05 DIAGNOSIS — Z803 Family history of malignant neoplasm of breast: Secondary | ICD-10-CM

## 2017-08-05 NOTE — Progress Notes (Signed)
[facility] Follow Up Note:  Patient Care Team: Gayland Curry, MD as PCP - General (Family Medicine)  CHIEF COMPLAINTS/PURPOSE OF CONSULTATION: My iron level is low.  HISTORY OF PRESENTING ILLNESS: Elizabeth Good 51 y.o. female with PMH list as below is referred by Dr.Aldridge for evaluation and management of iron deficiency anemia.  She has a history of iron deficiency anemia and history of gastric bypass in 2011. She has been taken oral iron supplementation ferrous sulfate until recently she stopped taking due to GI side effects. She reports feeling fatigued all the time. Her last menstrual period was in April. It was usually having. She has a referral to obtain screening colonoscopy. Labs done on 03/08/2017 at [facility] showed ferritin of 6, transferrin saturation 8   S/p IV iron with Feraheme 510 mg IV for 2 doses in July/August. Had unexplained 10 pounds weight gain, bloating. Normal TSH, normal 2D echo   INTERVAL HISTORY Patient present for follow up visit of iron deficiency anemia. She reports doing very well. Lately she feels tired as she started to taking nursing classed. Denies leg swelling, headache.   Review of Systems  Constitutional: Negative for appetite change and chills.  HENT:   Negative for hearing loss.   Eyes: Negative for eye problems.  Respiratory: Negative for chest tightness.   Cardiovascular: Negative for chest pain.  Gastrointestinal: Negative for abdominal distention.  Endocrine: Negative for hot flashes.  Genitourinary: Negative for difficulty urinating.   Musculoskeletal: Negative for arthralgias.  Skin: Negative for itching.  Neurological: Negative for dizziness.  Hematological: Negative for adenopathy.  Psychiatric/Behavioral: The patient is not nervous/anxious.     MEDICAL HISTORY: Past Medical History:  Diagnosis Date  . Allergy   . Bartholin cyst 1990  . Basal cell carcinoma   . GERD (gastroesophageal  reflux disease)   . High cholesterol   . History of bladder infections   . History of kidney infection   . History of kidney stones   . Meningitis    Age 14  . Renal disorder   . Seizures ([place])     SURGICAL HISTORY: Past Surgical History:  Procedure Laterality Date  . ANTERIOR AND POSTERIOR VAGINAL REPAIR     [facility]  . BLADDER SURGERY    . CHOLECYSTECTOMY  2011  . COMBINED HYSTEROSCOPY DIAGNOSTIC / D&C  12/06/2014   [facility]  . ENDOMETRIAL ABLATION  2011   [facility]  Dr. Amalia Hailey  . FOOT SURGERY Left   . GANGLION CYST EXCISION    . GASTRIC BYPASS  2011  . INCONTINENCE SURGERY    . RECTAL SURGERY    . SEPTOPLASTY    . TONSILLECTOMY      SOCIAL HISTORY: Social History   Socioeconomic History  . Marital status: Legally Separated    Spouse name: Not on file  . Number of children: Not on file  . Years of education: Not on file  . Highest education level: Not on file  Social Needs  . Financial resource strain: Not on file  . Food insecurity - worry: Not on file  . Food insecurity - inability: Not on file  . Transportation needs - medical: Not on file  . Transportation needs - non-medical: Not on file  Occupational History  . Not on file  Tobacco Use  . Smoking status: Never Smoker  . Smokeless tobacco: Never Used  Substance and Sexual Activity  . Alcohol use: Yes    Alcohol/week: 0.0 oz  Comment: social  . Drug use: No  . Sexual activity: No  Other Topics Concern  . Not on file  Social History Narrative  . Not on file    FAMILY HISTORY Family History  Problem Relation Age of Onset  . Bladder Cancer Mother   . Prostate cancer Father   . Melanoma Father   . Breast cancer Cousin     ALLERGIES:  is allergic to latex; penicillins; and sulfa antibiotics.  MEDICATIONS:  Current Outpatient Medications  Medication Sig Dispense Refill  . Calcium-Vitamin D 600-200 MG-UNIT tablet Take by mouth.    . cetirizine (ZYRTEC) 10 MG tablet Take 10 mg  by mouth daily.    . Cholecalciferol (VITAMIN D3) 5000 units TABS Frequency:QD   Dosage:0.0     Instructions:  Note:Dose: ?    . dexlansoprazole (DEXILANT) 60 MG capsule Take 60 mg by mouth daily.    Marland Kitchen HYDROcodone-acetaminophen (NORCO/VICODIN) 5-325 MG tablet Take 1 tablet by mouth every 6 (six) hours as needed for severe pain. 6 tablet 0  . lamoTRIgine (LAMICTAL) 200 MG tablet Take 200 mg by mouth 2 (two) times daily.     . Multiple Vitamin (MULTIVITAMIN) tablet Take 1 tablet by mouth daily.    . vitamin B-12 (CYANOCOBALAMIN) 1000 MCG tablet Frequency:QD   Dosage:0.0     Instructions:  Note:Dose: ?    . vitamin C (ASCORBIC ACID) 250 MG tablet Take 250 mg by mouth daily.     No current facility-administered medications for this visit.     PHYSICAL EXAMINATION:  ECOG PERFORMANCE STATUS: 0 - Asymptomatic   Vitals:   08/05/17 1102  BP: 134/84  Pulse: 92  Resp: 16  Temp: (!) 97.5 F (36.4 C)    Filed Weights   08/05/17 1102  Weight: 170 lb 10.2 oz (77.4 kg)     Physical Exam  Constitutional: She is oriented to person, place, and time and well-developed, well-nourished, and in no distress. No distress.  HENT:  Head: Normocephalic and atraumatic.  Mouth/Throat: No oropharyngeal exudate.  Eyes: Conjunctivae and EOM are normal. Pupils are equal, round, and reactive to light. No scleral icterus.  Neck: Normal range of motion. Neck supple. No JVD present.  Cardiovascular: Normal rate, regular rhythm and normal heart sounds.  No murmur heard. Pulmonary/Chest: Effort normal and breath sounds normal. No respiratory distress.  Abdominal: Soft. Bowel sounds are normal. She exhibits no distension.  Musculoskeletal: Normal range of motion. She exhibits no edema.  Lymphadenopathy:    She has no cervical adenopathy.  Neurological: She is alert and oriented to person, place, and time. Gait normal.  Skin: Skin is dry. No erythema.  Psychiatric: Affect normal.      LABORATORY DATA: I  have personally reviewed the data as listed: Labs from [facility] system.  Her recent labs done on 03/08/2017 showed iron 37, TIBC 440, saturation 8, ferritin 6. Wbc 5.9, Hb 11.8, Hct 37.6, platelet 410, MCV 76, RDW 14.8, normal differential. B12 635, Folate 13.2, Vitamin D 51, TSH 2.06. Cr 0.8, Bilirubin 0.5, ALT 13, AST 23. TSH 2.06  CBC    Component Value Date/Time   WBC 7.5 07/29/2017 1548   RBC 4.87 07/29/2017 1548   HGB 14.3 07/29/2017 1548   HGB 11.1 (L) 11/21/2014 1327   HCT 42.1 07/29/2017 1548   HCT 34.9 (L) 11/21/2014 1327   PLT 306 07/29/2017 1548   PLT 275 11/21/2014 1327   MCV 86.4 07/29/2017 1548   MCV 80 11/21/2014 1327  MCH 29.3 07/29/2017 1548   MCHC 33.9 07/29/2017 1548   RDW 12.7 07/29/2017 1548   RDW 14.5 11/21/2014 1327   LYMPHSABS 1.3 07/29/2017 1548   MONOABS 0.5 07/29/2017 1548   EOSABS 0.2 07/29/2017 1548   BASOSABS 0.0 07/29/2017 1548   Iron/TIBC/Ferritin/ %Sat    Component Value Date/Time   IRON 126 07/29/2017 1548   TIBC 326 07/29/2017 1548   FERRITIN 41 07/29/2017 1548   IRONPCTSAT 39 (H) 07/29/2017 1548   RADIOGRAPHIC STUDIES: I have personally reviewed the radiological images as listed and agree with the findings in the report Mammogram 03/06/2016 IMPRESSION: 1. 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 2.  No mammographic evidence of malignancy in the bilateral breasts.  ASSESSMENT/PLAN  1. Iron deficiency anemia, unspecified iron deficiency anemia type   2. H/O gastric bypass    #  Ferritin stable. No need for additional IV iron at this point  # Elevated HTN last time which has resolved.   Follow up in 6 months with cbc, iron, TIBC, b12 done prior to visit. Possible additional IV iron if needed.   All questions were answered. The patient knows to call the clinic with any problems, questions or concerns. Earlie Server, MD, PhD Hematology Oncology [facility] Pager- [phone] 08/05/2017

## 2017-08-05 NOTE — Progress Notes (Signed)
Patient here today for follow up.  Patient states no new concerns today  

## 2017-08-10 DIAGNOSIS — S63501A Unspecified sprain of right wrist, initial encounter: Secondary | ICD-10-CM | POA: Diagnosis not present

## 2017-08-10 DIAGNOSIS — M25531 Pain in right wrist: Secondary | ICD-10-CM | POA: Diagnosis not present

## 2017-08-10 DIAGNOSIS — M25631 Stiffness of right wrist, not elsewhere classified: Secondary | ICD-10-CM | POA: Diagnosis not present

## 2017-08-12 DIAGNOSIS — M25531 Pain in right wrist: Secondary | ICD-10-CM | POA: Diagnosis not present

## 2017-08-12 DIAGNOSIS — M25631 Stiffness of right wrist, not elsewhere classified: Secondary | ICD-10-CM | POA: Diagnosis not present

## 2017-08-17 DIAGNOSIS — M25531 Pain in right wrist: Secondary | ICD-10-CM | POA: Diagnosis not present

## 2017-08-17 DIAGNOSIS — S63501D Unspecified sprain of right wrist, subsequent encounter: Secondary | ICD-10-CM | POA: Diagnosis not present

## 2017-08-17 DIAGNOSIS — M25631 Stiffness of right wrist, not elsewhere classified: Secondary | ICD-10-CM | POA: Diagnosis not present

## 2017-08-19 DIAGNOSIS — M25631 Stiffness of right wrist, not elsewhere classified: Secondary | ICD-10-CM | POA: Diagnosis not present

## 2017-08-19 DIAGNOSIS — M25531 Pain in right wrist: Secondary | ICD-10-CM | POA: Diagnosis not present

## 2017-08-26 DIAGNOSIS — M25631 Stiffness of right wrist, not elsewhere classified: Secondary | ICD-10-CM | POA: Diagnosis not present

## 2017-08-26 DIAGNOSIS — M25531 Pain in right wrist: Secondary | ICD-10-CM | POA: Diagnosis not present

## 2017-09-02 DIAGNOSIS — M25631 Stiffness of right wrist, not elsewhere classified: Secondary | ICD-10-CM | POA: Diagnosis not present

## 2017-09-02 DIAGNOSIS — M25531 Pain in right wrist: Secondary | ICD-10-CM | POA: Diagnosis not present

## 2017-09-03 ENCOUNTER — Ambulatory Visit: Payer: Self-pay | Admitting: Urology

## 2017-09-06 DIAGNOSIS — R635 Abnormal weight gain: Secondary | ICD-10-CM | POA: Diagnosis not present

## 2017-09-06 DIAGNOSIS — R300 Dysuria: Secondary | ICD-10-CM | POA: Diagnosis not present

## 2017-09-06 DIAGNOSIS — E611 Iron deficiency: Secondary | ICD-10-CM | POA: Diagnosis not present

## 2017-09-07 DIAGNOSIS — M25531 Pain in right wrist: Secondary | ICD-10-CM | POA: Diagnosis not present

## 2017-09-07 DIAGNOSIS — M25631 Stiffness of right wrist, not elsewhere classified: Secondary | ICD-10-CM | POA: Diagnosis not present

## 2017-09-09 DIAGNOSIS — M5117 Intervertebral disc disorders with radiculopathy, lumbosacral region: Secondary | ICD-10-CM | POA: Diagnosis not present

## 2017-09-09 DIAGNOSIS — M461 Sacroiliitis, not elsewhere classified: Secondary | ICD-10-CM | POA: Diagnosis not present

## 2017-09-09 DIAGNOSIS — M955 Acquired deformity of pelvis: Secondary | ICD-10-CM | POA: Diagnosis not present

## 2017-09-09 DIAGNOSIS — M5116 Intervertebral disc disorders with radiculopathy, lumbar region: Secondary | ICD-10-CM | POA: Diagnosis not present

## 2017-09-09 DIAGNOSIS — M6080 Other myositis, unspecified site: Secondary | ICD-10-CM | POA: Diagnosis not present

## 2017-09-09 DIAGNOSIS — M9905 Segmental and somatic dysfunction of pelvic region: Secondary | ICD-10-CM | POA: Diagnosis not present

## 2017-09-09 DIAGNOSIS — M9903 Segmental and somatic dysfunction of lumbar region: Secondary | ICD-10-CM | POA: Diagnosis not present

## 2017-09-09 DIAGNOSIS — M9902 Segmental and somatic dysfunction of thoracic region: Secondary | ICD-10-CM | POA: Diagnosis not present

## 2017-09-09 DIAGNOSIS — M545 Low back pain: Secondary | ICD-10-CM | POA: Diagnosis not present

## 2017-09-13 DIAGNOSIS — M6080 Other myositis, unspecified site: Secondary | ICD-10-CM | POA: Diagnosis not present

## 2017-09-13 DIAGNOSIS — M545 Low back pain: Secondary | ICD-10-CM | POA: Diagnosis not present

## 2017-09-13 DIAGNOSIS — M955 Acquired deformity of pelvis: Secondary | ICD-10-CM | POA: Diagnosis not present

## 2017-09-13 DIAGNOSIS — M9903 Segmental and somatic dysfunction of lumbar region: Secondary | ICD-10-CM | POA: Diagnosis not present

## 2017-09-13 DIAGNOSIS — M5116 Intervertebral disc disorders with radiculopathy, lumbar region: Secondary | ICD-10-CM | POA: Diagnosis not present

## 2017-09-13 DIAGNOSIS — M9905 Segmental and somatic dysfunction of pelvic region: Secondary | ICD-10-CM | POA: Diagnosis not present

## 2017-09-13 DIAGNOSIS — M461 Sacroiliitis, not elsewhere classified: Secondary | ICD-10-CM | POA: Diagnosis not present

## 2017-09-13 DIAGNOSIS — M9902 Segmental and somatic dysfunction of thoracic region: Secondary | ICD-10-CM | POA: Diagnosis not present

## 2017-09-13 DIAGNOSIS — M5117 Intervertebral disc disorders with radiculopathy, lumbosacral region: Secondary | ICD-10-CM | POA: Diagnosis not present

## 2017-09-30 DIAGNOSIS — M6080 Other myositis, unspecified site: Secondary | ICD-10-CM | POA: Diagnosis not present

## 2017-09-30 DIAGNOSIS — M5117 Intervertebral disc disorders with radiculopathy, lumbosacral region: Secondary | ICD-10-CM | POA: Diagnosis not present

## 2017-09-30 DIAGNOSIS — M9902 Segmental and somatic dysfunction of thoracic region: Secondary | ICD-10-CM | POA: Diagnosis not present

## 2017-09-30 DIAGNOSIS — M9903 Segmental and somatic dysfunction of lumbar region: Secondary | ICD-10-CM | POA: Diagnosis not present

## 2017-09-30 DIAGNOSIS — M545 Low back pain: Secondary | ICD-10-CM | POA: Diagnosis not present

## 2017-09-30 DIAGNOSIS — M9905 Segmental and somatic dysfunction of pelvic region: Secondary | ICD-10-CM | POA: Diagnosis not present

## 2017-09-30 DIAGNOSIS — M5116 Intervertebral disc disorders with radiculopathy, lumbar region: Secondary | ICD-10-CM | POA: Diagnosis not present

## 2017-09-30 DIAGNOSIS — M955 Acquired deformity of pelvis: Secondary | ICD-10-CM | POA: Diagnosis not present

## 2017-09-30 DIAGNOSIS — M461 Sacroiliitis, not elsewhere classified: Secondary | ICD-10-CM | POA: Diagnosis not present

## 2017-10-14 DIAGNOSIS — M545 Low back pain: Secondary | ICD-10-CM | POA: Diagnosis not present

## 2017-10-14 DIAGNOSIS — M6080 Other myositis, unspecified site: Secondary | ICD-10-CM | POA: Diagnosis not present

## 2017-10-14 DIAGNOSIS — M5116 Intervertebral disc disorders with radiculopathy, lumbar region: Secondary | ICD-10-CM | POA: Diagnosis not present

## 2017-10-14 DIAGNOSIS — M9905 Segmental and somatic dysfunction of pelvic region: Secondary | ICD-10-CM | POA: Diagnosis not present

## 2017-10-14 DIAGNOSIS — M955 Acquired deformity of pelvis: Secondary | ICD-10-CM | POA: Diagnosis not present

## 2017-10-14 DIAGNOSIS — M9903 Segmental and somatic dysfunction of lumbar region: Secondary | ICD-10-CM | POA: Diagnosis not present

## 2017-10-14 DIAGNOSIS — M5117 Intervertebral disc disorders with radiculopathy, lumbosacral region: Secondary | ICD-10-CM | POA: Diagnosis not present

## 2017-10-14 DIAGNOSIS — M461 Sacroiliitis, not elsewhere classified: Secondary | ICD-10-CM | POA: Diagnosis not present

## 2017-10-14 DIAGNOSIS — M9902 Segmental and somatic dysfunction of thoracic region: Secondary | ICD-10-CM | POA: Diagnosis not present

## 2017-10-15 DIAGNOSIS — H5213 Myopia, bilateral: Secondary | ICD-10-CM | POA: Diagnosis not present

## 2017-10-28 DIAGNOSIS — M9905 Segmental and somatic dysfunction of pelvic region: Secondary | ICD-10-CM | POA: Diagnosis not present

## 2017-10-28 DIAGNOSIS — M6080 Other myositis, unspecified site: Secondary | ICD-10-CM | POA: Diagnosis not present

## 2017-10-28 DIAGNOSIS — M9902 Segmental and somatic dysfunction of thoracic region: Secondary | ICD-10-CM | POA: Diagnosis not present

## 2017-10-28 DIAGNOSIS — M9903 Segmental and somatic dysfunction of lumbar region: Secondary | ICD-10-CM | POA: Diagnosis not present

## 2017-10-28 DIAGNOSIS — M461 Sacroiliitis, not elsewhere classified: Secondary | ICD-10-CM | POA: Diagnosis not present

## 2017-10-28 DIAGNOSIS — M545 Low back pain: Secondary | ICD-10-CM | POA: Diagnosis not present

## 2017-10-28 DIAGNOSIS — M955 Acquired deformity of pelvis: Secondary | ICD-10-CM | POA: Diagnosis not present

## 2017-10-28 DIAGNOSIS — M5117 Intervertebral disc disorders with radiculopathy, lumbosacral region: Secondary | ICD-10-CM | POA: Diagnosis not present

## 2017-10-28 DIAGNOSIS — M5116 Intervertebral disc disorders with radiculopathy, lumbar region: Secondary | ICD-10-CM | POA: Diagnosis not present

## 2017-11-09 DIAGNOSIS — M25631 Stiffness of right wrist, not elsewhere classified: Secondary | ICD-10-CM | POA: Diagnosis not present

## 2017-11-09 DIAGNOSIS — M25531 Pain in right wrist: Secondary | ICD-10-CM | POA: Diagnosis not present

## 2017-11-18 ENCOUNTER — Ambulatory Visit: Payer: 59 | Admitting: Obstetrics and Gynecology

## 2017-11-23 DIAGNOSIS — M6080 Other myositis, unspecified site: Secondary | ICD-10-CM | POA: Diagnosis not present

## 2017-11-23 DIAGNOSIS — M461 Sacroiliitis, not elsewhere classified: Secondary | ICD-10-CM | POA: Diagnosis not present

## 2017-11-23 DIAGNOSIS — M9905 Segmental and somatic dysfunction of pelvic region: Secondary | ICD-10-CM | POA: Diagnosis not present

## 2017-11-23 DIAGNOSIS — M9903 Segmental and somatic dysfunction of lumbar region: Secondary | ICD-10-CM | POA: Diagnosis not present

## 2017-11-23 DIAGNOSIS — M955 Acquired deformity of pelvis: Secondary | ICD-10-CM | POA: Diagnosis not present

## 2017-11-23 DIAGNOSIS — M9902 Segmental and somatic dysfunction of thoracic region: Secondary | ICD-10-CM | POA: Diagnosis not present

## 2017-11-23 DIAGNOSIS — M5117 Intervertebral disc disorders with radiculopathy, lumbosacral region: Secondary | ICD-10-CM | POA: Diagnosis not present

## 2017-11-23 DIAGNOSIS — M545 Low back pain: Secondary | ICD-10-CM | POA: Diagnosis not present

## 2017-11-23 DIAGNOSIS — M5116 Intervertebral disc disorders with radiculopathy, lumbar region: Secondary | ICD-10-CM | POA: Diagnosis not present

## 2017-11-25 ENCOUNTER — Encounter: Payer: Self-pay | Admitting: Obstetrics and Gynecology

## 2017-11-25 ENCOUNTER — Ambulatory Visit: Payer: 59 | Admitting: Obstetrics and Gynecology

## 2017-11-25 VITALS — BP 142/82 | HR 111 | Ht 65.0 in | Wt 181.0 lb

## 2017-11-25 DIAGNOSIS — N939 Abnormal uterine and vaginal bleeding, unspecified: Secondary | ICD-10-CM | POA: Diagnosis not present

## 2017-11-25 DIAGNOSIS — Z1231 Encounter for screening mammogram for malignant neoplasm of breast: Secondary | ICD-10-CM

## 2017-11-25 DIAGNOSIS — Z124 Encounter for screening for malignant neoplasm of cervix: Secondary | ICD-10-CM | POA: Diagnosis not present

## 2017-11-25 DIAGNOSIS — Z1239 Encounter for other screening for malignant neoplasm of breast: Secondary | ICD-10-CM

## 2017-11-25 MED ORDER — NORETHINDRONE 0.35 MG PO TABS: 1.0000 | ORAL_TABLET | Freq: Every day | ORAL | 11 refills | Status: DC

## 2017-11-25 NOTE — Progress Notes (Signed)
Obstetrics & Gynecology Office Visit   Chief Complaint:  Chief Complaint  Patient presents with  . Follow up bleeding    History of Present Illness: 52 y.o. G2P2 presenting for follow up of abnormal perimenopausal uterine bleeding. She is currently initially opted for expectant management, and did have a 3 month period of amenorrhea before resuming regular monthly menses along with dysmenorrhea.   She had previously undergone endometrial ablation in the distant past for her symptoms.  She had undergone hysteroscopy D&C in 2016 with scarring encountered in the uterine cavity from prior ablation but normal pathology showing weakly proliferative endometrium.  She was trialed on norethindrone but discontinued secondary to edema and weight gain.  Also had attempt at IUD following ablation also with suboptimal control.  She presents today to disicuss further management options.  She has had mild vasomotor symptoms.    Review of Systems: Review of Systems  Constitutional: Negative for chills and fever.  Gastrointestinal: Positive for abdominal pain. Negative for nausea and vomiting.  Neurological: Negative for dizziness.  Endo/Heme/Allergies: Does not bruise/bleed easily.     Past Medical History:  Past Medical History:  Diagnosis Date  . Allergy   . Bartholin cyst 1990  . Basal cell carcinoma   . GERD (gastroesophageal reflux disease)   . High cholesterol   . History of bladder infections   . History of kidney infection   . History of kidney stones   . Meningitis    Age 58  . Renal disorder   . Seizures ([place])     Past Surgical History:  Past Surgical History:  Procedure Laterality Date  . ANTERIOR AND POSTERIOR VAGINAL REPAIR     [facility]  . BLADDER SURGERY    . CHOLECYSTECTOMY  2011  . COMBINED HYSTEROSCOPY DIAGNOSTIC / D&C  12/06/2014   [facility]  . ENDOMETRIAL ABLATION  2011   [facility]  Dr. Amalia Hailey  . FOOT SURGERY Left   . GANGLION CYST EXCISION    .  GASTRIC BYPASS  2011  . INCONTINENCE SURGERY    . RECTAL SURGERY    . SEPTOPLASTY    . TONSILLECTOMY      Gynecologic History: Patient's last menstrual period was 11/11/2017 (exact date).  Obstetric History: G2P2  Family History:  Family History  Problem Relation Age of Onset  . Bladder Cancer Mother   . Prostate cancer Father   . Melanoma Father   . Breast cancer Cousin     Social History:  Social History   Socioeconomic History  . Marital status: Legally Separated    Spouse name: Not on file  . Number of children: Not on file  . Years of education: Not on file  . Highest education level: Not on file  Occupational History  . Not on file  Social Needs  . Financial resource strain: Not on file  . Food insecurity:    Worry: Not on file    Inability: Not on file  . Transportation needs:    Medical: Not on file    Non-medical: Not on file  Tobacco Use  . Smoking status: Never Smoker  . Smokeless tobacco: Never Used  Substance and Sexual Activity  . Alcohol use: Yes    Alcohol/week: 0.0 oz    Comment: social  . Drug use: No  . Sexual activity: Never  Lifestyle  . Physical activity:    Days per week: Not on file    Minutes per session: Not on file  .  Stress: Not on file  Relationships  . Social connections:    Talks on phone: Not on file    Gets together: Not on file    Attends religious service: Not on file    Active member of club or organization: Not on file    Attends meetings of clubs or organizations: Not on file    Relationship status: Not on file  . Intimate partner violence:    Fear of current or ex partner: Not on file    Emotionally abused: Not on file    Physically abused: Not on file    Forced sexual activity: Not on file  Other Topics Concern  . Not on file  Social History Narrative  . Not on file    Allergies:  Allergies  Allergen Reactions  . Latex   . Penicillins   . Sulfa Antibiotics     Medications: Prior to Admission  medications   Medication Sig Start Date End Date Taking? Authorizing Provider  Calcium-Vitamin D 600-200 MG-UNIT tablet Take by mouth. 10/12/11  Yes [provider]  cetirizine (ZYRTEC) 10 MG tablet Take 10 mg by mouth daily.   Yes [provider]  Cholecalciferol (VITAMIN D3) 5000 units TABS Frequency:QD   Dosage:0.0     Instructions:  Note:Dose: ? 10/12/11  Yes [provider]  dexlansoprazole (DEXILANT) 60 MG capsule Take 60 mg by mouth daily.   Yes [provider]  lamoTRIgine (LAMICTAL) 200 MG tablet Take 200 mg by mouth 2 (two) times daily.    Yes [provider]  Multiple Vitamin (MULTIVITAMIN) tablet Take 1 tablet by mouth daily.   Yes [provider]  vitamin B-12 (CYANOCOBALAMIN) 1000 MCG tablet Frequency:QD   Dosage:0.0     Instructions:  Note:Dose: ? 10/12/11  Yes [provider]  vitamin C (ASCORBIC ACID) 250 MG tablet Take 250 mg by mouth daily.   Yes [provider]  norethindrone (MICRONOR,CAMILA,ERRIN) 0.35 MG tablet Take 1 tablet (0.35 mg total) by mouth daily. 11/25/17   Malachy Mood, MD    Physical Exam Vitals:  Vitals:   11/25/17 1507  BP: (!) 142/82  Pulse: (!) 111   Patient's last menstrual period was 11/11/2017 (exact date).  General: NAD HEENT: normocephalic, anicteric Pulmonary: No increased work of breathing Abdomen: Soft, non-tender, non-distended.  Umbilicus without lesions.  No hepatomegaly, splenomegaly or masses palpable. No evidence of hernia  Genitourinary:  External: Normal external female genitalia.  Normal urethral meatus, normal  Bartholin's and Skene's glands.    Vagina: Normal vaginal mucosa, no evidence of prolapse.    Cervix: Grossly normal in appearance, no bleeding  Uterus: Non-enlarged, mobile, normal contour.  No CMT  Adnexa: ovaries non-enlarged, no adnexal masses  Rectal: deferred  Lymphatic: no evidence of inguinal lymphadenopathy Extremities: no edema, erythema,  or tenderness Neurologic: Grossly intact Psychiatric: mood appropriate, affect full  Female chaperone present for pelvic  portions of the physical exam    ENDOMETRIAL BIOPSY     The indications for endometrial biopsy were reviewed.   Risks of the biopsy including cramping, bleeding, infection, uterine perforation, inadequate specimen and need for additional procedures  were discussed. The patient states she understands and agrees to undergo procedure today. Consent was signed. Time out was performed. Urine HCG was not obtained. A Graves speculum was placed and the cervix was brought into view.  The cervix was prepped with Betadine. A single-toothed tenaculum was  placed on the anterior lip of the cervix for traction. A  3 mm pipelle was introduced through the cervix into the endometrial cavity without difficulty to a depth of 6cm, and a small amount of tissue was obtained, the resulting specime sent to pathology. The instruments were removed from the patient's vagina. Minimal bleeding from the cervix was noted. The patient tolerated the procedure well. Routine post-procedure instructions were given to the patient.  She will be contacted by phone one results become available.     Assessment: 52 y.o. with abnormal uterine bleeding, dysmenorrhea in setting of prior endometrial ablation  Plan: Problem List Items Addressed This Visit    None    Visit Diagnoses    Abnormal uterine bleeding    -  Primary   Relevant Orders   Pathology Report   Cervical cancer screening       Relevant Orders   PapIG, HPV, rfx 16/18   Breast screening       Relevant Orders   MM DIGITAL SCREENING BILATERAL     1) Abnormal uterine bleeding - endometrial biopsy obtained today, given scarring this will likely only represent lower uterine segment.  Will trial patient on low dose norethindrone with 0.35mg .  Pap obtained to rule out cervical pathology.  If continued bleeding or intolerance to norethindrone the only  really option left given her prior ablation is hysterectomy.  I discussed given her prior findings repeat ablation was not feasible.  2) A total of 15 minutes were spent in face-to-face contact with the patient during this encounter with over half of that time devoted to counseling and coordination of care.  3) Mammogram due and ordered today  4)  Return in about 1 month (around 12/23/2017) for 4-6 weeks medication follow up.   Malachy Mood, MD, [facility], [facility] 11/25/2017, 9:26 PM

## 2017-11-25 NOTE — Patient Instructions (Signed)

## 2017-11-29 LAB — PATHOLOGY

## 2017-12-07 DIAGNOSIS — M5117 Intervertebral disc disorders with radiculopathy, lumbosacral region: Secondary | ICD-10-CM | POA: Diagnosis not present

## 2017-12-07 DIAGNOSIS — M9905 Segmental and somatic dysfunction of pelvic region: Secondary | ICD-10-CM | POA: Diagnosis not present

## 2017-12-07 DIAGNOSIS — M6080 Other myositis, unspecified site: Secondary | ICD-10-CM | POA: Diagnosis not present

## 2017-12-07 DIAGNOSIS — M9902 Segmental and somatic dysfunction of thoracic region: Secondary | ICD-10-CM | POA: Diagnosis not present

## 2017-12-07 DIAGNOSIS — M545 Low back pain: Secondary | ICD-10-CM | POA: Diagnosis not present

## 2017-12-07 DIAGNOSIS — M461 Sacroiliitis, not elsewhere classified: Secondary | ICD-10-CM | POA: Diagnosis not present

## 2017-12-07 DIAGNOSIS — M955 Acquired deformity of pelvis: Secondary | ICD-10-CM | POA: Diagnosis not present

## 2017-12-07 DIAGNOSIS — M9903 Segmental and somatic dysfunction of lumbar region: Secondary | ICD-10-CM | POA: Diagnosis not present

## 2017-12-07 DIAGNOSIS — M5116 Intervertebral disc disorders with radiculopathy, lumbar region: Secondary | ICD-10-CM | POA: Diagnosis not present

## 2017-12-07 LAB — PAPIG, HPV, RFX 16/18: PAP Smear Comment: 0

## 2017-12-07 LAB — HPV, LOW VOLUME (REFLEX)

## 2018-01-05 DIAGNOSIS — M5116 Intervertebral disc disorders with radiculopathy, lumbar region: Secondary | ICD-10-CM | POA: Diagnosis not present

## 2018-01-05 DIAGNOSIS — M9903 Segmental and somatic dysfunction of lumbar region: Secondary | ICD-10-CM | POA: Diagnosis not present

## 2018-01-05 DIAGNOSIS — M461 Sacroiliitis, not elsewhere classified: Secondary | ICD-10-CM | POA: Diagnosis not present

## 2018-01-05 DIAGNOSIS — M9905 Segmental and somatic dysfunction of pelvic region: Secondary | ICD-10-CM | POA: Diagnosis not present

## 2018-01-05 DIAGNOSIS — M955 Acquired deformity of pelvis: Secondary | ICD-10-CM | POA: Diagnosis not present

## 2018-01-05 DIAGNOSIS — M9902 Segmental and somatic dysfunction of thoracic region: Secondary | ICD-10-CM | POA: Diagnosis not present

## 2018-01-05 DIAGNOSIS — M545 Low back pain: Secondary | ICD-10-CM | POA: Diagnosis not present

## 2018-01-05 DIAGNOSIS — M6080 Other myositis, unspecified site: Secondary | ICD-10-CM | POA: Diagnosis not present

## 2018-01-05 DIAGNOSIS — M5117 Intervertebral disc disorders with radiculopathy, lumbosacral region: Secondary | ICD-10-CM | POA: Diagnosis not present

## 2018-01-18 DIAGNOSIS — M9905 Segmental and somatic dysfunction of pelvic region: Secondary | ICD-10-CM | POA: Diagnosis not present

## 2018-01-18 DIAGNOSIS — M955 Acquired deformity of pelvis: Secondary | ICD-10-CM | POA: Diagnosis not present

## 2018-01-18 DIAGNOSIS — M461 Sacroiliitis, not elsewhere classified: Secondary | ICD-10-CM | POA: Diagnosis not present

## 2018-01-18 DIAGNOSIS — M5116 Intervertebral disc disorders with radiculopathy, lumbar region: Secondary | ICD-10-CM | POA: Diagnosis not present

## 2018-01-18 DIAGNOSIS — M5117 Intervertebral disc disorders with radiculopathy, lumbosacral region: Secondary | ICD-10-CM | POA: Diagnosis not present

## 2018-01-18 DIAGNOSIS — M6080 Other myositis, unspecified site: Secondary | ICD-10-CM | POA: Diagnosis not present

## 2018-01-18 DIAGNOSIS — M9903 Segmental and somatic dysfunction of lumbar region: Secondary | ICD-10-CM | POA: Diagnosis not present

## 2018-01-18 DIAGNOSIS — M9902 Segmental and somatic dysfunction of thoracic region: Secondary | ICD-10-CM | POA: Diagnosis not present

## 2018-01-18 DIAGNOSIS — M545 Low back pain: Secondary | ICD-10-CM | POA: Diagnosis not present

## 2018-01-31 ENCOUNTER — Other Ambulatory Visit: Payer: Self-pay

## 2018-02-01 DIAGNOSIS — M5116 Intervertebral disc disorders with radiculopathy, lumbar region: Secondary | ICD-10-CM | POA: Diagnosis not present

## 2018-02-01 DIAGNOSIS — M6080 Other myositis, unspecified site: Secondary | ICD-10-CM | POA: Diagnosis not present

## 2018-02-01 DIAGNOSIS — M461 Sacroiliitis, not elsewhere classified: Secondary | ICD-10-CM | POA: Diagnosis not present

## 2018-02-01 DIAGNOSIS — M545 Low back pain: Secondary | ICD-10-CM | POA: Diagnosis not present

## 2018-02-01 DIAGNOSIS — M9902 Segmental and somatic dysfunction of thoracic region: Secondary | ICD-10-CM | POA: Diagnosis not present

## 2018-02-01 DIAGNOSIS — M9905 Segmental and somatic dysfunction of pelvic region: Secondary | ICD-10-CM | POA: Diagnosis not present

## 2018-02-01 DIAGNOSIS — M955 Acquired deformity of pelvis: Secondary | ICD-10-CM | POA: Diagnosis not present

## 2018-02-01 DIAGNOSIS — M5117 Intervertebral disc disorders with radiculopathy, lumbosacral region: Secondary | ICD-10-CM | POA: Diagnosis not present

## 2018-02-01 DIAGNOSIS — M9903 Segmental and somatic dysfunction of lumbar region: Secondary | ICD-10-CM | POA: Diagnosis not present

## 2018-02-03 ENCOUNTER — Ambulatory Visit: Payer: Self-pay | Admitting: Oncology

## 2018-02-03 ENCOUNTER — Ambulatory Visit: Payer: Self-pay

## 2018-02-10 ENCOUNTER — Encounter (INDEPENDENT_AMBULATORY_CARE_PROVIDER_SITE_OTHER): Payer: Self-pay | Admitting: Vascular Surgery

## 2018-02-10 ENCOUNTER — Ambulatory Visit (INDEPENDENT_AMBULATORY_CARE_PROVIDER_SITE_OTHER): Payer: 59 | Admitting: Vascular Surgery

## 2018-02-10 DIAGNOSIS — I83813 Varicose veins of bilateral lower extremities with pain: Secondary | ICD-10-CM | POA: Diagnosis not present

## 2018-02-10 DIAGNOSIS — I872 Venous insufficiency (chronic) (peripheral): Secondary | ICD-10-CM | POA: Diagnosis not present

## 2018-02-11 ENCOUNTER — Encounter (INDEPENDENT_AMBULATORY_CARE_PROVIDER_SITE_OTHER): Payer: Self-pay | Admitting: Vascular Surgery

## 2018-02-11 DIAGNOSIS — I83813 Varicose veins of bilateral lower extremities with pain: Secondary | ICD-10-CM | POA: Insufficient documentation

## 2018-02-11 DIAGNOSIS — I872 Venous insufficiency (chronic) (peripheral): Secondary | ICD-10-CM | POA: Insufficient documentation

## 2018-02-11 NOTE — Progress Notes (Signed)
MRN : [id number]  Elizabeth Good is a 52 y.o. (18-Jun-1966) female who presents with chief complaint of  Chief Complaint  Patient presents with  . Follow-up    vein evaulation  .  History of Present Illness:   The patient is seen for evaluation of symptomatic varicose veins. The patient relates burning and stinging which worsened steadily throughout the course of the day, particularly with standing. The patient also notes an aching and throbbing pain over the varicosities, particularly with prolonged dependent positions. The symptoms are significantly improved with elevation.  The patient also notes that during hot weather the symptoms are greatly intensified. The patient states the pain from the varicose veins interferes with work, daily exercise, shopping and household maintenance. At this point, the symptoms are persistent and severe enough that they're having a negative impact on lifestyle and are interfering with daily activities.  There is no history of DVT, PE or superficial thrombophlebitis. There is no history of ulceration or hemorrhage. The patient denies a significant family history of varicose veins.  The patient has worn graduated compression for years. At the present time the patient has not been using over-the-counter analgesics. There is no history of prior surgical intervention or sclerotherapy.    Current Meds  Medication Sig  . Calcium-Vitamin D 600-200 MG-UNIT tablet Take by mouth.  . cetirizine (ZYRTEC) 10 MG tablet Take 10 mg by mouth daily.  . Cholecalciferol (VITAMIN D3) 5000 units TABS Frequency:QD   Dosage:0.0     Instructions:  Note:Dose: ?  . dexlansoprazole (DEXILANT) 60 MG capsule Take 60 mg by mouth daily.  Marland Kitchen lamoTRIgine (LAMICTAL) 200 MG tablet Take 200 mg by mouth 2 (two) times daily.   . Multiple Vitamin (MULTIVITAMIN) tablet Take 1 tablet by mouth daily.  . norethindrone (MICRONOR,CAMILA,ERRIN) 0.35 MG tablet Take 1 tablet (0.35 mg total) by  mouth daily.  . vitamin B-12 (CYANOCOBALAMIN) 1000 MCG tablet Frequency:QD   Dosage:0.0     Instructions:  Note:Dose: ?  . vitamin C (ASCORBIC ACID) 250 MG tablet Take 250 mg by mouth daily.    Past Medical History:  Diagnosis Date  . Allergy   . Bartholin cyst 1990  . Basal cell carcinoma   . GERD (gastroesophageal reflux disease)   . High cholesterol   . History of bladder infections   . History of kidney infection   . History of kidney stones   . Meningitis    Age 81  . Renal disorder   . Seizures ([place])     Past Surgical History:  Procedure Laterality Date  . ANTERIOR AND POSTERIOR VAGINAL REPAIR     [facility]  . BLADDER SURGERY    . CHOLECYSTECTOMY  2011  . COMBINED HYSTEROSCOPY DIAGNOSTIC / D&C  12/06/2014   [facility]  . ENDOMETRIAL ABLATION  2011   [facility]  Dr. Amalia Hailey  . FOOT SURGERY Left   . GANGLION CYST EXCISION    . GASTRIC BYPASS  2011  . INCONTINENCE SURGERY    . RECTAL SURGERY    . SEPTOPLASTY    . TONSILLECTOMY      Social History Social History   Tobacco Use  . Smoking status: Never Smoker  . Smokeless tobacco: Never Used  Substance Use Topics  . Alcohol use: Yes    Alcohol/week: 0.0 oz    Comment: social  . Drug use: No    Family History Family History  Problem Relation Age of Onset  . Bladder Cancer Mother   .  Prostate cancer Father   . Melanoma Father   . Breast cancer Cousin   No family history of bleeding/clotting disorders, porphyria or autoimmune disease   Allergies  Allergen Reactions  . Latex   . Penicillins   . Sulfa Antibiotics      REVIEW OF SYSTEMS (Negative unless checked)  Constitutional: [] Weight loss  [] Fever  [] Chills Cardiac: [] Chest pain   [] Chest pressure   [] Palpitations   [] Shortness of breath when laying flat   [] Shortness of breath with exertion. Vascular:  [] Pain in legs with walking   [x] Pain in legs at rest  [] History of DVT   [] Phlebitis   [x] Swelling in legs   [x] Varicose veins    [] Non-healing ulcers Pulmonary:   [] Uses home oxygen   [] Productive cough   [] Hemoptysis   [] Wheeze  [] COPD   [] Asthma Neurologic:  [] Dizziness   [] Seizures   [] History of stroke   [] History of TIA  [] Aphasia   [] Vissual changes   [] Weakness or numbness in arm   [] Weakness or numbness in leg Musculoskeletal:   [] Joint swelling   [] Joint pain   [] Low back pain Hematologic:  [] Easy bruising  [] Easy bleeding   [] Hypercoagulable state   [] Anemic Gastrointestinal:  [] Diarrhea   [] Vomiting  [] Gastroesophageal reflux/heartburn   [] Difficulty swallowing. Genitourinary:  [] Chronic kidney disease   [] Difficult urination  [] Frequent urination   [] Blood in urine Skin:  [] Rashes   [] Ulcers  Psychological:  [] History of anxiety   []  History of major depression.  Physical Examination  Vitals:   02/10/18 1515  BP: (!) 147/88  Pulse: 96  Resp: 17  Weight: 181 lb 12.8 oz (82.5 kg)  Height: 5\' 5"  (1.651 m)   Body mass index is 30.25 kg/m. Gen: WD/WN, NAD Head: [place]/AT, No temporalis wasting.  Ear/Nose/Throat: Hearing grossly intact, nares w/o erythema or drainage, poor dentition Eyes: PER, EOMI, sclera nonicteric.  Neck: Supple, no masses.  No bruit or JVD.  Pulmonary:  Good air movement, clear to auscultation bilaterally, no use of accessory muscles.  Cardiac: RRR, normal S1, S2, no Murmurs. Vascular: Large varicosities present extensively greater than 10 mm bilaterally.  Mild venous stasis changes to the legs bilaterally.  2+ soft pitting edema Vessel Right Left  Radial Palpable Palpable  PT Palpable Palpable  DP Palpable Palpable  Gastrointestinal: soft, non-distended. No guarding/no peritoneal signs.  Musculoskeletal: M/S 5/5 throughout.  No deformity or atrophy.  Neurologic: CN 2-12 intact. Pain and light touch intact in extremities.  Symmetrical.  Speech is fluent. Motor exam as listed above. Psychiatric: Judgment intact, Mood & affect appropriate for pt's clinical situation. Dermatologic:  venous rashes no ulcers noted.  No changes consistent with cellulitis. Lymph : No Cervical lymphadenopathy, no lichenification or skin changes of chronic lymphedema.  CBC Lab Results  Component Value Date   WBC 7.5 07/29/2017   HGB 14.3 07/29/2017   HCT 42.1 07/29/2017   MCV 86.4 07/29/2017   PLT 306 07/29/2017    BMET    Component Value Date/Time   NA 140 04/29/2017 0833   NA 137 11/21/2014 1327   K 3.7 04/29/2017 0833   K 3.3 (L) 11/21/2014 1327   CL 105 04/29/2017 0833   CL 103 11/21/2014 1327   CO2 28 04/29/2017 0833   CO2 28 11/21/2014 1327   GLUCOSE 85 04/29/2017 0833   GLUCOSE 89 11/21/2014 1327   BUN 9 04/29/2017 0833   BUN 10 11/21/2014 1327   CREATININE 0.74 04/29/2017 0833   CREATININE 0.71 11/21/2014  1327   CALCIUM 8.9 04/29/2017 0833   CALCIUM 8.5 (L) 11/21/2014 1327   GFRNONAA >60 04/29/2017 0833   GFRNONAA >60 11/21/2014 1327   GFRAA >60 04/29/2017 0833   GFRAA >60 11/21/2014 1327   CrCl cannot be calculated (Patient's most recent lab result is older than the maximum 21 days allowed.).  COAG No results found for: INR, PROTIME  Radiology No results found.    Assessment/Plan 1. Varicose veins of both lower extremities with pain  Recommend:  The patient has large symptomatic varicose veins that are painful and associated with swelling.  I have had a long discussion with the patient regarding  varicose veins and why they cause symptoms.  Patient will begin wearing graduated compression stockings class 1 on a daily basis, beginning first thing in the morning and removing them in the evening. The patient is instructed specifically not to sleep in the stockings.    The patient  will also begin using over-the-counter analgesics such as Motrin 600 mg po TID to help control the symptoms.    In addition, behavioral modification including elevation during the day will be initiated.    Pending the results of these changes the  patient will be reevaluated  in three months.   An  ultrasound of the venous system will be obtained.   Further plans will be based on the ultrasound results and whether conservative therapies are successful at eliminating the pain and swelling.   - VAS [place] LOWER EXTREMITY VENOUS REFLUX; Future  2. Chronic venous insufficiency No surgery or intervention at this point in time.    I have had a long discussion with the patient regarding venous insufficiency and why it  causes symptoms. I have discussed with the patient the chronic skin changes that accompany venous insufficiency and the long term sequela such as infection and ulceration.  Patient will begin wearing graduated compression stockings class 1 (20-30 mmHg) or compression wraps on a daily basis a prescription was given. The patient will put the stockings on first thing in the morning and removing them in the evening. The patient is instructed specifically not to sleep in the stockings.    In addition, behavioral modification including several periods of elevation of the lower extremities during the day will be continued. I have demonstrated that proper elevation is a position with the ankles at heart level.  The patient is instructed to begin routine exercise, especially walking on a daily basis  Patient should undergo duplex ultrasound of the venous system to ensure that DVT or reflux is not present.  Following the review of the ultrasound the patient will follow up in 2-3 months to reassess the degree of swelling and the control that graduated compression stockings or compression wraps  is offering.   The patient can be assessed for a Lymph Pump at that time    Hortencia Pilar, MD  02/11/2018 4:24 PM

## 2018-02-23 DIAGNOSIS — M955 Acquired deformity of pelvis: Secondary | ICD-10-CM | POA: Diagnosis not present

## 2018-02-23 DIAGNOSIS — M9903 Segmental and somatic dysfunction of lumbar region: Secondary | ICD-10-CM | POA: Diagnosis not present

## 2018-02-23 DIAGNOSIS — M545 Low back pain: Secondary | ICD-10-CM | POA: Diagnosis not present

## 2018-02-23 DIAGNOSIS — M5116 Intervertebral disc disorders with radiculopathy, lumbar region: Secondary | ICD-10-CM | POA: Diagnosis not present

## 2018-02-23 DIAGNOSIS — M5117 Intervertebral disc disorders with radiculopathy, lumbosacral region: Secondary | ICD-10-CM | POA: Diagnosis not present

## 2018-02-23 DIAGNOSIS — M9902 Segmental and somatic dysfunction of thoracic region: Secondary | ICD-10-CM | POA: Diagnosis not present

## 2018-02-23 DIAGNOSIS — M9905 Segmental and somatic dysfunction of pelvic region: Secondary | ICD-10-CM | POA: Diagnosis not present

## 2018-02-23 DIAGNOSIS — M6080 Other myositis, unspecified site: Secondary | ICD-10-CM | POA: Diagnosis not present

## 2018-02-23 DIAGNOSIS — M461 Sacroiliitis, not elsewhere classified: Secondary | ICD-10-CM | POA: Diagnosis not present

## 2018-03-09 DIAGNOSIS — Z0000 Encounter for general adult medical examination without abnormal findings: Secondary | ICD-10-CM | POA: Diagnosis not present

## 2018-03-09 DIAGNOSIS — R300 Dysuria: Secondary | ICD-10-CM | POA: Diagnosis not present

## 2018-03-09 DIAGNOSIS — Z79899 Other long term (current) drug therapy: Secondary | ICD-10-CM | POA: Diagnosis not present

## 2018-03-09 DIAGNOSIS — K912 Postsurgical malabsorption, not elsewhere classified: Secondary | ICD-10-CM | POA: Diagnosis not present

## 2018-03-09 DIAGNOSIS — Z9884 Bariatric surgery status: Secondary | ICD-10-CM | POA: Diagnosis not present

## 2018-03-17 ENCOUNTER — Other Ambulatory Visit: Payer: Self-pay | Admitting: Family Medicine

## 2018-03-17 DIAGNOSIS — M545 Low back pain: Secondary | ICD-10-CM | POA: Diagnosis not present

## 2018-03-17 DIAGNOSIS — M9905 Segmental and somatic dysfunction of pelvic region: Secondary | ICD-10-CM | POA: Diagnosis not present

## 2018-03-17 DIAGNOSIS — M461 Sacroiliitis, not elsewhere classified: Secondary | ICD-10-CM | POA: Diagnosis not present

## 2018-03-17 DIAGNOSIS — M9903 Segmental and somatic dysfunction of lumbar region: Secondary | ICD-10-CM | POA: Diagnosis not present

## 2018-03-17 DIAGNOSIS — M6080 Other myositis, unspecified site: Secondary | ICD-10-CM | POA: Diagnosis not present

## 2018-03-17 DIAGNOSIS — M5117 Intervertebral disc disorders with radiculopathy, lumbosacral region: Secondary | ICD-10-CM | POA: Diagnosis not present

## 2018-03-17 DIAGNOSIS — M5116 Intervertebral disc disorders with radiculopathy, lumbar region: Secondary | ICD-10-CM | POA: Diagnosis not present

## 2018-03-17 DIAGNOSIS — M955 Acquired deformity of pelvis: Secondary | ICD-10-CM | POA: Diagnosis not present

## 2018-03-17 DIAGNOSIS — K912 Postsurgical malabsorption, not elsewhere classified: Secondary | ICD-10-CM

## 2018-03-17 DIAGNOSIS — Z9884 Bariatric surgery status: Secondary | ICD-10-CM

## 2018-03-17 DIAGNOSIS — M9902 Segmental and somatic dysfunction of thoracic region: Secondary | ICD-10-CM | POA: Diagnosis not present

## 2018-04-07 ENCOUNTER — Encounter

## 2018-04-07 ENCOUNTER — Encounter (INDEPENDENT_AMBULATORY_CARE_PROVIDER_SITE_OTHER): Payer: Self-pay | Admitting: Vascular Surgery

## 2018-04-07 ENCOUNTER — Ambulatory Visit (INDEPENDENT_AMBULATORY_CARE_PROVIDER_SITE_OTHER): Payer: 59 | Admitting: Vascular Surgery

## 2018-04-07 ENCOUNTER — Ambulatory Visit (INDEPENDENT_AMBULATORY_CARE_PROVIDER_SITE_OTHER): Payer: 59

## 2018-04-07 VITALS — BP 143/95 | HR 94 | Resp 16 | Ht 65.0 in | Wt 176.6 lb

## 2018-04-07 DIAGNOSIS — I83813 Varicose veins of bilateral lower extremities with pain: Secondary | ICD-10-CM | POA: Diagnosis not present

## 2018-04-07 DIAGNOSIS — I872 Venous insufficiency (chronic) (peripheral): Secondary | ICD-10-CM

## 2018-04-13 ENCOUNTER — Encounter (INDEPENDENT_AMBULATORY_CARE_PROVIDER_SITE_OTHER): Payer: Self-pay | Admitting: Vascular Surgery

## 2018-04-13 NOTE — Progress Notes (Signed)
MRN : [id number]  Elizabeth Good is a 52 y.o. (11/03/1965) female who presents with chief complaint of  Chief Complaint  Patient presents with  . Follow-up    pt conv bil ven reflux  .  History of Present Illness:   The patient returns for followup evaluation status post remote successful laser ablation bilateral great saphenous veins.  She has noted a steady and significant progression of her varicose veins and now has very painful veins which are 8 to 12 mm in diameter scattered on both legs.  The patient continues to have pain in the lower extremities with dependency and tenderness of her varicosities as well. The pain is lessened with elevation. Graduated compression stockings, Class I (20-30 mmHg), have been worn but the stockings do not eliminate the leg pain. Over-the-counter analgesics do not improve the symptoms. The degree of discomfort continues to interfere with daily activities. The patient notes the pain in the legs is causing problems with daily exercise, at the workplace and even with household activities and maintenance such as standing in the kitchen preparing meals and doing dishes.   Venous ultrasound shows normal deep venous system, no evidence of acute or chronic DVT.  Superficial reflux is not present in the saphenous veins bilaterally as they have been successfully ablated  Current Meds  Medication Sig  . Calcium-Vitamin D 600-200 MG-UNIT tablet Take by mouth.  . cetirizine (ZYRTEC) 10 MG tablet Take 10 mg by mouth daily.  . Cholecalciferol (VITAMIN D3) 5000 units TABS Frequency:QD   Dosage:0.0     Instructions:  Note:Dose: ?  . dexlansoprazole (DEXILANT) 60 MG capsule Take 60 mg by mouth daily.  Marland Kitchen lamoTRIgine (LAMICTAL) 200 MG tablet Take 200 mg by mouth 2 (two) times daily.   . Multiple Vitamin (MULTIVITAMIN) tablet Take 1 tablet by mouth daily.  . norethindrone (MICRONOR,CAMILA,ERRIN) 0.35 MG tablet Take 1 tablet (0.35 mg total) by mouth daily.  . vitamin  B-12 (CYANOCOBALAMIN) 1000 MCG tablet Frequency:QD   Dosage:0.0     Instructions:  Note:Dose: ?  . vitamin C (ASCORBIC ACID) 250 MG tablet Take 250 mg by mouth daily.    Past Medical History:  Diagnosis Date  . Allergy   . Bartholin cyst 1990  . Basal cell carcinoma   . GERD (gastroesophageal reflux disease)   . High cholesterol   . History of bladder infections   . History of kidney infection   . History of kidney stones   . Meningitis    Age 46  . Renal disorder   . Seizures ([place])     Past Surgical History:  Procedure Laterality Date  . ANTERIOR AND POSTERIOR VAGINAL REPAIR     [facility]  . BLADDER SURGERY    . CHOLECYSTECTOMY  2011  . COMBINED HYSTEROSCOPY DIAGNOSTIC / D&C  12/06/2014   [facility]  . ENDOMETRIAL ABLATION  2011   [facility]  Dr. Amalia Hailey  . FOOT SURGERY Left   . GANGLION CYST EXCISION    . GASTRIC BYPASS  2011  . INCONTINENCE SURGERY    . RECTAL SURGERY    . SEPTOPLASTY    . TONSILLECTOMY      Social History Social History   Tobacco Use  . Smoking status: Never Smoker  . Smokeless tobacco: Never Used  Substance Use Topics  . Alcohol use: Yes    Alcohol/week: 0.0 standard drinks    Comment: social  . Drug use: No    Family History Family History  Problem Relation Age of Onset  . Bladder Cancer Mother   . Prostate cancer Father   . Melanoma Father   . Breast cancer Cousin     Allergies  Allergen Reactions  . Latex   . Penicillins   . Sulfa Antibiotics      REVIEW OF SYSTEMS (Negative unless checked)  Constitutional: [] Weight loss  [] Fever  [] Chills Cardiac: [] Chest pain   [] Chest pressure   [] Palpitations   [] Shortness of breath when laying flat   [] Shortness of breath with exertion. Vascular:  [] Pain in legs with walking   [x] Pain in legs at rest  [] History of DVT   [] Phlebitis   [x] Swelling in legs   [x] Varicose veins   [] Non-healing ulcers Pulmonary:   [] Uses home oxygen   [] Productive cough   [] Hemoptysis   [] Wheeze   [] COPD   [] Asthma Neurologic:  [] Dizziness   [] Seizures   [] History of stroke   [] History of TIA  [] Aphasia   [] Vissual changes   [] Weakness or numbness in arm   [] Weakness or numbness in leg Musculoskeletal:   [] Joint swelling   [] Joint pain   [] Low back pain Hematologic:  [] Easy bruising  [] Easy bleeding   [] Hypercoagulable state   [] Anemic Gastrointestinal:  [] Diarrhea   [] Vomiting  [] Gastroesophageal reflux/heartburn   [] Difficulty swallowing. Genitourinary:  [] Chronic kidney disease   [] Difficult urination  [] Frequent urination   [] Blood in urine Skin:  [] Rashes   [] Ulcers  Psychological:  [] History of anxiety   []  History of major depression.  Physical Examination  Vitals:   04/07/18 1419  BP: (!) 143/95  Pulse: 94  Resp: 16  Weight: 176 lb 9.6 oz (80.1 kg)  Height: 5\' 5"  (1.651 m)   Body mass index is 29.39 kg/m. Gen: WD/WN, NAD Head: /AT, No temporalis wasting.  Ear/Nose/Throat: Hearing grossly intact, nares w/o erythema or drainage Eyes: PER, EOMI, sclera nonicteric.  Neck: Supple, no large masses.   Pulmonary:  Good air movement, no audible wheezing bilaterally, no use of accessory muscles.  Cardiac: RRR, no JVD Vascular: Large varicosities present extensively greater than 10 mm bilaterally.  Mild venous stasis changes to the legs bilaterally.  2-3+ soft pitting edema Vessel Right Left  Radial Palpable Palpable  PT Palpable Palpable  DP Palpable Palpable  Gastrointestinal: Non-distended. No guarding/no peritoneal signs.  Musculoskeletal: M/S 5/5 throughout.  No deformity or atrophy.  Neurologic: CN 2-12 intact. Symmetrical.  Speech is fluent. Motor exam as listed above. Psychiatric: Judgment intact, Mood & affect appropriate for pt's clinical situation. Dermatologic: Venous rashes no ulcers noted.  No changes consistent with cellulitis. Lymph : No lichenification or skin changes of chronic lymphedema.  CBC Lab Results  Component Value Date   WBC 7.5 07/29/2017     HGB 14.3 07/29/2017   HCT 42.1 07/29/2017   MCV 86.4 07/29/2017   PLT 306 07/29/2017    BMET    Component Value Date/Time   NA 140 04/29/2017 0833   NA 137 11/21/2014 1327   K 3.7 04/29/2017 0833   K 3.3 (L) 11/21/2014 1327   CL 105 04/29/2017 0833   CL 103 11/21/2014 1327   CO2 28 04/29/2017 0833   CO2 28 11/21/2014 1327   GLUCOSE 85 04/29/2017 0833   GLUCOSE 89 11/21/2014 1327   BUN 9 04/29/2017 0833   BUN 10 11/21/2014 1327   CREATININE 0.74 04/29/2017 0833   CREATININE 0.71 11/21/2014 1327   CALCIUM 8.9 04/29/2017 0833   CALCIUM 8.5 (L) 11/21/2014 1327  GFRNONAA >60 04/29/2017 0833   GFRNONAA >60 11/21/2014 1327   GFRAA >60 04/29/2017 0833   GFRAA >60 11/21/2014 1327   CrCl cannot be calculated (Patient's most recent lab result is older than the maximum 21 days allowed.).  COAG No results found for: INR, PROTIME  Radiology No results found.   Assessment/Plan 1. Varicose veins of both lower extremities with pain Recommend:  The patient has had successful ablation of the previously incompetent saphenous venous system but still has persistent symptoms of pain and swelling that are having a negative impact on daily life and daily activities.  Patient should undergo injection sclerotherapy to treat the residual varicosities.  The risks, benefits and alternative therapies were reviewed in detail with the patient.  All questions were answered.  The patient agrees to proceed with sclerotherapy at their convenience.  The patient will continue wearing the graduated compression stockings and using the over-the-counter pain medications to treat her symptoms.     2. Chronic venous insufficiency No surgery or intervention at this point in time.    I have had a long discussion with the patient regarding venous insufficiency and why it  causes symptoms. I have discussed with the patient the chronic skin changes that accompany venous insufficiency and the long term  sequela such as infection and ulceration.  Patient will begin wearing graduated compression stockings class 1 (20-30 mmHg) or compression wraps on a daily basis a prescription was given. The patient will put the stockings on first thing in the morning and removing them in the evening. The patient is instructed specifically not to sleep in the stockings.    In addition, behavioral modification including several periods of elevation of the lower extremities during the day will be continued. I have demonstrated that proper elevation is a position with the ankles at heart level.  The patient is instructed to begin routine exercise, especially walking on a daily basis     Hortencia Pilar, MD  04/13/2018 5:23 PM

## 2018-04-25 ENCOUNTER — Inpatient Hospital Stay: Payer: 59 | Attending: Oncology

## 2018-04-25 DIAGNOSIS — D508 Other iron deficiency anemias: Secondary | ICD-10-CM | POA: Diagnosis not present

## 2018-04-25 DIAGNOSIS — Z9884 Bariatric surgery status: Secondary | ICD-10-CM

## 2018-04-25 DIAGNOSIS — D509 Iron deficiency anemia, unspecified: Secondary | ICD-10-CM

## 2018-04-25 DIAGNOSIS — N920 Excessive and frequent menstruation with regular cycle: Secondary | ICD-10-CM | POA: Diagnosis not present

## 2018-04-25 LAB — CBC WITH DIFFERENTIAL/PLATELET
BASOS ABS: 0.1 10*3/uL (ref 0–0.1)
Basophils Relative: 1 %
EOS ABS: 0.2 10*3/uL (ref 0–0.7)
EOS PCT: 3 %
HCT: 43.2 % (ref 35.0–47.0)
Hemoglobin: 14.4 g/dL (ref 12.0–16.0)
LYMPHS PCT: 23 %
Lymphs Abs: 2 10*3/uL (ref 1.0–3.6)
MCH: 29.6 pg (ref 26.0–34.0)
MCHC: 33.4 g/dL (ref 32.0–36.0)
MCV: 88.6 fL (ref 80.0–100.0)
MONO ABS: 0.6 10*3/uL (ref 0.2–0.9)
Monocytes Relative: 8 %
Neutro Abs: 5.8 10*3/uL (ref 1.4–6.5)
Neutrophils Relative %: 67 %
PLATELETS: 338 10*3/uL (ref 150–440)
RBC: 4.87 MIL/uL (ref 3.80–5.20)
RDW: 13 % (ref 11.5–14.5)
WBC: 8.7 10*3/uL (ref 3.6–11.0)

## 2018-04-25 LAB — IRON AND TIBC
IRON: 110 ug/dL (ref 28–170)
SATURATION RATIOS: 29 % (ref 10.4–31.8)
TIBC: 375 ug/dL (ref 250–450)
UIBC: 265 ug/dL

## 2018-04-25 LAB — FERRITIN: FERRITIN: 13 ng/mL (ref 11–307)

## 2018-04-25 LAB — VITAMIN B12: Vitamin B-12: 645 pg/mL (ref 180–914)

## 2018-04-27 ENCOUNTER — Other Ambulatory Visit: Payer: Self-pay

## 2018-04-28 ENCOUNTER — Ambulatory Visit: Payer: Self-pay | Admitting: Oncology

## 2018-04-28 ENCOUNTER — Ambulatory Visit: Payer: Self-pay

## 2018-05-05 ENCOUNTER — Ambulatory Visit: Payer: Self-pay | Admitting: Urology

## 2018-05-11 ENCOUNTER — Inpatient Hospital Stay: Payer: 59

## 2018-05-11 ENCOUNTER — Inpatient Hospital Stay: Payer: 59 | Attending: Oncology | Admitting: Oncology

## 2018-05-11 ENCOUNTER — Other Ambulatory Visit: Payer: Self-pay

## 2018-05-11 ENCOUNTER — Encounter: Payer: Self-pay | Admitting: Oncology

## 2018-05-11 VITALS — BP 144/88 | HR 85 | Temp 98.6°F | Resp 18 | Wt 175.9 lb

## 2018-05-11 VITALS — BP 132/82 | HR 84 | Resp 18

## 2018-05-11 DIAGNOSIS — Z9884 Bariatric surgery status: Secondary | ICD-10-CM | POA: Diagnosis not present

## 2018-05-11 DIAGNOSIS — D508 Other iron deficiency anemias: Secondary | ICD-10-CM | POA: Insufficient documentation

## 2018-05-11 DIAGNOSIS — D509 Iron deficiency anemia, unspecified: Secondary | ICD-10-CM

## 2018-05-11 MED ORDER — SODIUM CHLORIDE 0.9 % IV SOLN
INTRAVENOUS | Status: DC
  Administered 2018-05-11: 15:00:00 via INTRAVENOUS
  Filled 2018-05-11: qty 250

## 2018-05-11 MED ORDER — FERUMOXYTOL INJECTION 510 MG/17 ML
510.0000 mg | Freq: Once | INTRAVENOUS | Status: AC
  Administered 2018-05-11: 510 mg via INTRAVENOUS
  Filled 2018-05-11: qty 17

## 2018-05-11 NOTE — Progress Notes (Signed)
Patient here for follow up. No concerns voiced.  °

## 2018-05-11 NOTE — Progress Notes (Signed)
Elizabeth Good:  Patient Care Team: Gayland Curry, MD as PCP - General (Family Medicine)  REASON FOR VISIT Follow up for treatment of anemia.   HISTORY OF PRESENTING ILLNESS: Elizabeth Good 52 y.o. female with PMH list as below is referred by Dr.Aldridge for evaluation and management of iron deficiency anemia.  She has a history of iron deficiency anemia and history of gastric bypass in 2011. She has been taken oral iron supplementation ferrous sulfate until recently she stopped taking due to GI side effects. She reports feeling fatigued all the time. Her last menstrual period was in April. It was usually having. She has a referral to obtain screening colonoscopy. Labs done on 03/08/2017 at [facility] showed ferritin of 6, transferrin saturation 8   S/p IV iron with Feraheme 510 mg IV for 2 doses in July/August. Had unexplained 10 pounds weight gain, bloating. Normal TSH, normal 2D echo   INTERVAL HISTORY Patient present for follow up visit of iron deficiency anemia.   Fatigue, she reports fatigue was improved after previous IV iron infusions. Although she had mild side effects including weight gain and lower extremity swelling after treatment, she feels fatigue was significantly better. Recently she feels more fatigue.    Review of Systems  Constitutional: Positive for fatigue. Negative for appetite change and chills.  HENT:   Negative for hearing loss.   Eyes: Negative for eye problems.  Respiratory: Negative for chest tightness.   Cardiovascular: Negative for chest pain.  Gastrointestinal: Negative for abdominal distention.  Endocrine: Negative for hot flashes.  Genitourinary: Negative for difficulty urinating.   Musculoskeletal: Negative for arthralgias.  Skin: Negative for itching.  Neurological: Negative for dizziness.  Hematological: Negative for adenopathy.  Psychiatric/Behavioral: The patient is not nervous/anxious.      MEDICAL HISTORY: Past Medical History:  Diagnosis Date  . Allergy   . Bartholin cyst 1990  . Basal cell carcinoma   . GERD (gastroesophageal reflux disease)   . High cholesterol   . History of bladder infections   . History of kidney infection   . History of kidney stones   . Meningitis    Age 69  . Renal disorder   . Seizures ([place])     SURGICAL HISTORY: Past Surgical History:  Procedure Laterality Date  . ANTERIOR AND POSTERIOR VAGINAL REPAIR     [facility]  . BLADDER SURGERY    . CHOLECYSTECTOMY  2011  . COMBINED HYSTEROSCOPY DIAGNOSTIC / D&C  12/06/2014   [facility]  . ENDOMETRIAL ABLATION  2011   [facility]  Dr. Amalia Hailey  . FOOT SURGERY Left   . GANGLION CYST EXCISION    . GASTRIC BYPASS  2011  . INCONTINENCE SURGERY    . RECTAL SURGERY    . SEPTOPLASTY    . TONSILLECTOMY      SOCIAL HISTORY: Social History   Socioeconomic History  . Marital status: Legally Separated    Spouse name: Not on file  . Number of children: Not on file  . Years of education: Not on file  . Highest education level: Not on file  Occupational History  . Not on file  Social Needs  . Financial resource strain: Not on file  . Food insecurity:    Worry: Not on file    Inability: Not on file  . Transportation needs:    Medical: Not on file    Non-medical: Not on file  Tobacco Use  . Smoking status: Never  Smoker  . Smokeless tobacco: Never Used  Substance and Sexual Activity  . Alcohol use: Yes    Alcohol/week: 0.0 standard drinks    Comment: social  . Drug use: No  . Sexual activity: Never  Lifestyle  . Physical activity:    Days per week: Not on file    Minutes per session: Not on file  . Stress: Not on file  Relationships  . Social connections:    Talks on phone: Not on file    Gets together: Not on file    Attends religious service: Not on file    Active member of club or organization: Not on file    Attends meetings of clubs or organizations: Not on file     Relationship status: Not on file  . Intimate partner violence:    Fear of current or ex partner: Not on file    Emotionally abused: Not on file    Physically abused: Not on file    Forced sexual activity: Not on file  Other Topics Concern  . Not on file  Social History Narrative  . Not on file    FAMILY HISTORY Family History  Problem Relation Age of Onset  . Bladder Cancer Mother   . Prostate cancer Father   . Melanoma Father   . Breast cancer Cousin     ALLERGIES:  is allergic to latex; penicillins; and sulfa antibiotics.  MEDICATIONS:  Current Outpatient Medications  Medication Sig Dispense Refill  . cetirizine (ZYRTEC) 10 MG tablet Take 10 mg by mouth daily.    Marland Kitchen dexlansoprazole (DEXILANT) 60 MG capsule Take 60 mg by mouth daily.    Marland Kitchen lamoTRIgine (LAMICTAL) 200 MG tablet Take 200 mg by mouth 2 (two) times daily.     . Multiple Vitamin (MULTIVITAMIN) tablet Take 1 tablet by mouth daily.    . norethindrone (MICRONOR,CAMILA,ERRIN) 0.35 MG tablet Take 1 tablet (0.35 mg total) by mouth daily. 1 Package 11  . Calcium-Vitamin D 600-200 MG-UNIT tablet Take by mouth.    . Cholecalciferol (VITAMIN D3) 5000 units TABS Frequency:QD   Dosage:0.0     Instructions:  Good:Dose: ?    . vitamin B-12 (CYANOCOBALAMIN) 1000 MCG tablet Frequency:QD   Dosage:0.0     Instructions:  Good:Dose: ?    . vitamin C (ASCORBIC ACID) 250 MG tablet Take 250 mg by mouth daily.     No current facility-administered medications for this visit.     PHYSICAL EXAMINATION:  ECOG PERFORMANCE STATUS: 0 - Asymptomatic  Vitals:   05/11/18 1355  BP: (!) 144/88  Pulse: 85  Resp: 18  Temp: 98.6 F (37 C)    Filed Weights   05/11/18 1355  Weight: 175 lb 14.4 oz (79.8 kg)     Physical Exam  Constitutional: She is oriented to person, place, and time and well-developed, well-nourished, and in no distress. No distress.  HENT:  Head: Normocephalic and atraumatic.  Nose: Nose normal.  Mouth/Throat:  Oropharynx is clear and moist. No oropharyngeal exudate.  Eyes: Pupils are equal, round, and reactive to light. Conjunctivae and EOM are normal. Left eye exhibits no discharge. No scleral icterus.  Neck: Normal range of motion. Neck supple. No JVD present.  Cardiovascular: Normal rate, regular rhythm and normal heart sounds.  No murmur heard. Pulmonary/Chest: Effort normal and breath sounds normal. No respiratory distress. She has no rales. She exhibits no tenderness.  Abdominal: Soft. Bowel sounds are normal. She exhibits no distension and no mass. There is no  tenderness.  Musculoskeletal: Normal range of motion. She exhibits no edema.  Lymphadenopathy:    She has no cervical adenopathy.  Neurological: She is alert and oriented to person, place, and time. No cranial nerve deficit. She exhibits normal muscle tone. Gait normal. Coordination normal.  Skin: Skin is warm and dry. She is not diaphoretic. No erythema.  Psychiatric: Affect normal.      LABORATORY DATA: I have personally reviewed the data as listed: Labs from [facility] system.  Her recent labs done on 03/08/2017 showed iron 37, TIBC 440, saturation 8, ferritin 6. Wbc 5.9, Hb 11.8, Hct 37.6, platelet 410, MCV 76, RDW 14.8, normal differential. B12 635, Folate 13.2, Vitamin D 51, TSH 2.06. Cr 0.8, Bilirubin 0.5, ALT 13, AST 23. TSH 2.06  CBC    Component Value Date/Time   WBC 8.7 04/25/2018 1517   RBC 4.87 04/25/2018 1517   HGB 14.4 04/25/2018 1517   HGB 11.1 (L) 11/21/2014 1327   HCT 43.2 04/25/2018 1517   HCT 34.9 (L) 11/21/2014 1327   PLT 338 04/25/2018 1517   PLT 275 11/21/2014 1327   MCV 88.6 04/25/2018 1517   MCV 80 11/21/2014 1327   MCH 29.6 04/25/2018 1517   MCHC 33.4 04/25/2018 1517   RDW 13.0 04/25/2018 1517   RDW 14.5 11/21/2014 1327   LYMPHSABS 2.0 04/25/2018 1517   MONOABS 0.6 04/25/2018 1517   EOSABS 0.2 04/25/2018 1517   BASOSABS 0.1 04/25/2018 1517   Iron/TIBC/Ferritin/ %Sat    Component Value  Date/Time   IRON 110 04/25/2018 1517   TIBC 375 04/25/2018 1517   FERRITIN 13 04/25/2018 1517   IRONPCTSAT 29 04/25/2018 1517   RADIOGRAPHIC STUDIES: I have personally reviewed the radiological images as listed and agree with the findings in the report Mammogram 03/06/2016 IMPRESSION: 1. 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 2.  No mammographic evidence of malignancy in the bilateral breasts.  ASSESSMENT/PLAN  1. Other iron deficiency anemia   2. H/O gastric bypass    # Labs are reviewed and discussed with patient.  Ferritin panel decreased to 13. Hemoglobin has been stable and normal.  H/o gastric bypass.  Will proceed with one dose of Fereheme 200mg  IV x 1 today.   B12 was checked and is normal.   Follow up in 6 months with cbc, iron, TIBC, b12 done prior to visit. Possible additional IV iron if needed.   All questions were answered. The patient knows to call the clinic with any problems, questions or concerns. Total face to face encounter time for this patient visit was 15 min. >50% of the time was  spent in counseling and coordination of care.  Earlie Server, MD, PhD Hematology Oncology [facility] Pager- [phone] 05/11/2018

## 2018-05-24 ENCOUNTER — Telehealth: Payer: 59 | Admitting: Family

## 2018-05-24 DIAGNOSIS — B370 Candidal stomatitis: Secondary | ICD-10-CM

## 2018-05-24 MED ORDER — NYSTATIN 100000 UNIT/ML MT SUSP: 5.0000 mL | Freq: Four times a day (QID) | OROMUCOSAL | 0 refills | Status: DC

## 2018-05-24 MED ORDER — FLUCONAZOLE 150 MG PO TABS: 150.0000 mg | ORAL_TABLET | Freq: Once | ORAL | 0 refills | Status: AC

## 2018-05-24 NOTE — Addendum Note (Signed)
Addended by: Dutch Quint B on: 05/24/2018 12:29 PM   Modules accepted: Orders

## 2018-05-24 NOTE — Progress Notes (Signed)
E Visit for Rash  We are sorry that you are not feeling well. Here is how we plan to help!  We will treat the thrush with Diflucan 150 mg once.    GET HELP RIGHT AWAY IF:   Symptoms don't go away after treatment.  Severe itching that persists.  If you rash spreads or swells.  If you rash begins to smell.  If it blisters and opens or develops a yellow-brown crust.  You develop a fever.  You have a sore throat.  You become short of breath.  MAKE SURE YOU:  Understand these instructions. Will watch your condition. Will get help right away if you are not doing well or get worse.  Thank you for choosing an e-visit. Your e-visit answers were reviewed by a board certified advanced clinical practitioner to complete your personal care plan. Depending upon the condition, your plan could have included both over the counter or prescription medications. Please review your pharmacy choice. Be sure that the pharmacy you have chosen is open so that you can pick up your prescription now.  If there is a problem you may message your provider in [place] to have the prescription routed to another pharmacy. Your safety is important to [place]. If you have drug allergies check your prescription carefully.  For the next 24 hours, you can use MyChart to ask questions about today's visit, request a non-urgent call back, or ask for a work or school excuse from your e-visit provider. You will get an email in the next two days asking about your experience. I hope that your e-visit has been valuable and will speed your recovery.

## 2018-06-13 DIAGNOSIS — Z9884 Bariatric surgery status: Secondary | ICD-10-CM | POA: Diagnosis not present

## 2018-06-16 ENCOUNTER — Encounter (INDEPENDENT_AMBULATORY_CARE_PROVIDER_SITE_OTHER): Payer: 59

## 2018-06-16 ENCOUNTER — Ambulatory Visit (INDEPENDENT_AMBULATORY_CARE_PROVIDER_SITE_OTHER): Payer: 59 | Admitting: Vascular Surgery

## 2018-06-16 DIAGNOSIS — R82994 Hypercalciuria: Secondary | ICD-10-CM | POA: Insufficient documentation

## 2018-06-22 ENCOUNTER — Encounter: Payer: Self-pay | Admitting: Urology

## 2018-06-22 ENCOUNTER — Ambulatory Visit: Payer: 59 | Admitting: Urology

## 2018-06-22 DIAGNOSIS — R3129 Other microscopic hematuria: Secondary | ICD-10-CM

## 2018-06-22 DIAGNOSIS — R300 Dysuria: Secondary | ICD-10-CM | POA: Diagnosis not present

## 2018-06-22 DIAGNOSIS — Z87442 Personal history of urinary calculi: Secondary | ICD-10-CM

## 2018-06-22 LAB — MICROSCOPIC EXAMINATION: Bacteria, UA: NONE SEEN

## 2018-06-22 LAB — URINALYSIS, COMPLETE
BILIRUBIN UA: NEGATIVE
Glucose, UA: NEGATIVE
KETONES UA: NEGATIVE
LEUKOCYTES UA: NEGATIVE
Nitrite, UA: NEGATIVE
PH UA: 5.5 (ref 5.0–7.5)
Protein, UA: NEGATIVE
UUROB: 0.2 mg/dL (ref 0.2–1.0)

## 2018-06-23 ENCOUNTER — Encounter: Payer: Self-pay | Admitting: Urology

## 2018-06-23 DIAGNOSIS — Z87442 Personal history of urinary calculi: Secondary | ICD-10-CM | POA: Insufficient documentation

## 2018-06-23 DIAGNOSIS — R3129 Other microscopic hematuria: Secondary | ICD-10-CM | POA: Insufficient documentation

## 2018-06-23 DIAGNOSIS — R300 Dysuria: Secondary | ICD-10-CM | POA: Insufficient documentation

## 2018-06-23 NOTE — Progress Notes (Signed)
06/22/2018 7:23 AM   Elizabeth Good 02-May-1966 [id number]  Referring provider: Gayland Curry, MD [address] [place], [place] [address]  Chief Complaint  Patient presents with  . Dysuria    HPI: 52 year old female seen in consultation at the request of Dr. Astrid Divine for evaluation of recurrent UTI, microhematuria.  She has a prior history of stone disease and underwent shockwave lithotripsy by Dr. Jacqlyn Larsen in early 2015.  Stone analysis was mixed calcium oxalate.  She had a 24 urine study which showed low urine volume at 1.7 L.  There was mild hyperoxaluria and low urine magnesium.  Dietary oxalate moderation, Citracal and magnesium supplement was recommended.  She has not had any imaging since 2015.  She thinks at times she has passed occasional small stones.  She has been treated for a lower tract UTIs on occasion.  Review of her PCP records remarkable for positive urine cultures November 2018 and July 2018.  Cultures grew mixed flora or insignificant growth late July 2018 and early July 2019.  She has occasional mild dysuria.  She did have a 24-hour urine for volume and calcium only in mid October 2019 which showed a total urine volume of 4120 mL and a calcium level of 461 mg per 24 hours.  Serum calcium and parathyroid hormone levels were normal.   PMH: Past Medical History:  Diagnosis Date  . Allergy   . Bartholin cyst 1990  . Basal cell carcinoma   . GERD (gastroesophageal reflux disease)   . High cholesterol   . History of bladder infections   . History of kidney infection   . History of kidney stones   . Meningitis    Age 59  . Renal disorder   . Seizures [facility])     Surgical History: Past Surgical History:  Procedure Laterality Date  . ANTERIOR AND POSTERIOR VAGINAL REPAIR     [facility]  . BLADDER SURGERY    . CHOLECYSTECTOMY  2011  . COMBINED HYSTEROSCOPY DIAGNOSTIC / D&C  12/06/2014   [facility]  . ENDOMETRIAL ABLATION  2011   [facility]  Dr.  Amalia Hailey  . FOOT SURGERY Left   . GANGLION CYST EXCISION    . GASTRIC BYPASS  2011  . INCONTINENCE SURGERY    . RECTAL SURGERY    . SEPTOPLASTY    . TONSILLECTOMY      Home Medications:  Allergies as of 06/22/2018      Reactions   Latex    Penicillins    Sulfa Antibiotics       Medication List        Accurate as of 06/22/18 11:59 PM. Always use your most recent med list.          Calcium-Vitamin D 600-200 MG-UNIT tablet Take by mouth.   cetirizine 10 MG tablet Commonly known as:  ZYRTEC Take 10 mg by mouth daily.   dexlansoprazole 60 MG capsule Commonly known as:  DEXILANT Take 60 mg by mouth daily.   LamoTRIgine 200 MG Tb24 24 hour tablet   multivitamin tablet Take 1 tablet by mouth daily.   norethindrone 0.35 MG tablet Commonly known as:  MICRONOR,CAMILA,ERRIN Take 1 tablet (0.35 mg total) by mouth daily.   vitamin B-12 1000 MCG tablet Commonly known as:  CYANOCOBALAMIN Frequency:QD   Dosage:0.0     Instructions:  Note:Dose: ?   vitamin C 250 MG tablet Commonly known as:  ASCORBIC ACID Take 250 mg by mouth daily.   Vitamin D3 5000  units Tabs Frequency:QD   Dosage:0.0     Instructions:  Note:Dose: ?       Allergies:  Allergies  Allergen Reactions  . Latex   . Penicillins   . Sulfa Antibiotics     Family History: Family History  Problem Relation Age of Onset  . Bladder Cancer Mother   . Prostate cancer Father   . Melanoma Father   . Breast cancer Cousin     Social History:  reports that she has never smoked. She has never used smokeless tobacco. She reports that she drinks alcohol. She reports that she does not use drugs.  ROS: UROLOGY Frequent Urination?: No Hard to postpone urination?: No Burning/pain with urination?: Yes Get up at night to urinate?: Yes Leakage of urine?: No Urine stream starts and stops?: No Trouble starting stream?: No Do you have to strain to urinate?: No Blood in urine?: Yes Urinary tract infection?:  Yes Sexually transmitted disease?: No Injury to kidneys or bladder?: No Painful intercourse?: No Weak stream?: No Currently pregnant?: No Vaginal bleeding?: No Last menstrual period?: 11/29/2017  Gastrointestinal Nausea?: No Vomiting?: No Indigestion/heartburn?: Yes Diarrhea?: No Constipation?: No  Constitutional Fever: No Night sweats?: No Weight loss?: No Fatigue?: No  Skin Skin rash/lesions?: No Itching?: No  Eyes Blurred vision?: No Double vision?: No  Ears/Nose/Throat Sore throat?: No Sinus problems?: Yes  Hematologic/Lymphatic Swollen glands?: No Easy bruising?: No  Cardiovascular Leg swelling?: No Chest pain?: No  Respiratory Cough?: No Shortness of breath?: No  Endocrine Excessive thirst?: No  Musculoskeletal Back pain?: Yes Joint pain?: No  Neurological Headaches?: Yes Dizziness?: No  Psychologic Depression?: No Anxiety?: No  Physical Exam: BP (!) 157/96 (BP Location: Left Arm, Patient Position: Sitting, Cuff Size: Normal)   Pulse 92   Ht 5\' 5"  (1.651 m)   Wt 185 lb (83.9 kg)   LMP 11/29/2017   BMI 30.79 kg/m   Constitutional:  Alert and oriented, No acute distress. HEENT: [place] AT, moist mucus membranes.  Trachea midline. Cardiovascular: No clubbing, cyanosis, or edema. Respiratory: Normal respiratory effort, no increased work of breathing. Lymph: No cervical or inguinal lymphadenopathy. Skin: No rashes, bruises or suspicious lesions. Neurologic: Grossly intact, no focal deficits, moving all 4 extremities. Psychiatric: Normal mood and affect.  Laboratory Data:  Urinalysis Dipstick 1+ blood Microscopy 3-10 RBC  Assessment & Plan:   52 year old female with a history of stone disease.  She has had intermittent dysuria and occasional UTI.  No recent upper tract imaging.  A 24-hour urine for calcium only showed excellent urine volume but hypercalciuria.  Prior 24-hour urine showed no evidence of hypercalciuria.  I recommended  scheduling a stone protocol CT of the abdomen and pelvis.  Would recommend a complete 24-hour urine stone evaluation study prior to addressing her hypercalciuria.    Abbie Sons, [place] [address], [place], [place] [address] [phone]

## 2018-06-26 LAB — CULTURE, URINE COMPREHENSIVE

## 2018-06-27 ENCOUNTER — Encounter: Payer: Self-pay | Admitting: Urology

## 2018-06-27 DIAGNOSIS — M5431 Sciatica, right side: Secondary | ICD-10-CM | POA: Diagnosis not present

## 2018-06-27 DIAGNOSIS — M9903 Segmental and somatic dysfunction of lumbar region: Secondary | ICD-10-CM | POA: Diagnosis not present

## 2018-06-27 DIAGNOSIS — M4306 Spondylolysis, lumbar region: Secondary | ICD-10-CM | POA: Diagnosis not present

## 2018-06-27 DIAGNOSIS — M9905 Segmental and somatic dysfunction of pelvic region: Secondary | ICD-10-CM | POA: Diagnosis not present

## 2018-06-28 ENCOUNTER — Ambulatory Visit (INDEPENDENT_AMBULATORY_CARE_PROVIDER_SITE_OTHER): Payer: 59 | Admitting: Vascular Surgery

## 2018-06-29 DIAGNOSIS — M4306 Spondylolysis, lumbar region: Secondary | ICD-10-CM | POA: Diagnosis not present

## 2018-06-29 DIAGNOSIS — M9905 Segmental and somatic dysfunction of pelvic region: Secondary | ICD-10-CM | POA: Diagnosis not present

## 2018-06-29 DIAGNOSIS — M5431 Sciatica, right side: Secondary | ICD-10-CM | POA: Diagnosis not present

## 2018-06-29 DIAGNOSIS — M9903 Segmental and somatic dysfunction of lumbar region: Secondary | ICD-10-CM | POA: Diagnosis not present

## 2018-07-01 DIAGNOSIS — M5431 Sciatica, right side: Secondary | ICD-10-CM | POA: Diagnosis not present

## 2018-07-01 DIAGNOSIS — M9903 Segmental and somatic dysfunction of lumbar region: Secondary | ICD-10-CM | POA: Diagnosis not present

## 2018-07-01 DIAGNOSIS — M4306 Spondylolysis, lumbar region: Secondary | ICD-10-CM | POA: Diagnosis not present

## 2018-07-01 DIAGNOSIS — M9905 Segmental and somatic dysfunction of pelvic region: Secondary | ICD-10-CM | POA: Diagnosis not present

## 2018-07-04 DIAGNOSIS — M5431 Sciatica, right side: Secondary | ICD-10-CM | POA: Diagnosis not present

## 2018-07-04 DIAGNOSIS — M4306 Spondylolysis, lumbar region: Secondary | ICD-10-CM | POA: Diagnosis not present

## 2018-07-04 DIAGNOSIS — M9903 Segmental and somatic dysfunction of lumbar region: Secondary | ICD-10-CM | POA: Diagnosis not present

## 2018-07-04 DIAGNOSIS — M9905 Segmental and somatic dysfunction of pelvic region: Secondary | ICD-10-CM | POA: Diagnosis not present

## 2018-07-06 ENCOUNTER — Ambulatory Visit: Payer: 59

## 2018-07-06 DIAGNOSIS — I10 Essential (primary) hypertension: Secondary | ICD-10-CM | POA: Diagnosis not present

## 2018-07-07 DIAGNOSIS — M4306 Spondylolysis, lumbar region: Secondary | ICD-10-CM | POA: Diagnosis not present

## 2018-07-07 DIAGNOSIS — M9905 Segmental and somatic dysfunction of pelvic region: Secondary | ICD-10-CM | POA: Diagnosis not present

## 2018-07-07 DIAGNOSIS — M5431 Sciatica, right side: Secondary | ICD-10-CM | POA: Diagnosis not present

## 2018-07-07 DIAGNOSIS — M9903 Segmental and somatic dysfunction of lumbar region: Secondary | ICD-10-CM | POA: Diagnosis not present

## 2018-07-11 DIAGNOSIS — M5431 Sciatica, right side: Secondary | ICD-10-CM | POA: Diagnosis not present

## 2018-07-11 DIAGNOSIS — M9903 Segmental and somatic dysfunction of lumbar region: Secondary | ICD-10-CM | POA: Diagnosis not present

## 2018-07-11 DIAGNOSIS — M9905 Segmental and somatic dysfunction of pelvic region: Secondary | ICD-10-CM | POA: Diagnosis not present

## 2018-07-11 DIAGNOSIS — M4306 Spondylolysis, lumbar region: Secondary | ICD-10-CM | POA: Diagnosis not present

## 2018-07-12 ENCOUNTER — Ambulatory Visit (INDEPENDENT_AMBULATORY_CARE_PROVIDER_SITE_OTHER): Payer: 59 | Admitting: Vascular Surgery

## 2018-07-13 DIAGNOSIS — M9903 Segmental and somatic dysfunction of lumbar region: Secondary | ICD-10-CM | POA: Diagnosis not present

## 2018-07-13 DIAGNOSIS — M4306 Spondylolysis, lumbar region: Secondary | ICD-10-CM | POA: Diagnosis not present

## 2018-07-13 DIAGNOSIS — M5431 Sciatica, right side: Secondary | ICD-10-CM | POA: Diagnosis not present

## 2018-07-13 DIAGNOSIS — M9905 Segmental and somatic dysfunction of pelvic region: Secondary | ICD-10-CM | POA: Diagnosis not present

## 2018-07-15 ENCOUNTER — Other Ambulatory Visit: Payer: Self-pay | Admitting: Obstetrics and Gynecology

## 2018-07-15 ENCOUNTER — Telehealth: Payer: Self-pay

## 2018-07-15 DIAGNOSIS — M9905 Segmental and somatic dysfunction of pelvic region: Secondary | ICD-10-CM | POA: Diagnosis not present

## 2018-07-15 DIAGNOSIS — M5431 Sciatica, right side: Secondary | ICD-10-CM | POA: Diagnosis not present

## 2018-07-15 DIAGNOSIS — M9903 Segmental and somatic dysfunction of lumbar region: Secondary | ICD-10-CM | POA: Diagnosis not present

## 2018-07-15 DIAGNOSIS — M4306 Spondylolysis, lumbar region: Secondary | ICD-10-CM | POA: Diagnosis not present

## 2018-07-15 MED ORDER — NORETHINDRONE 0.35 MG PO TABS: 1.0000 | ORAL_TABLET | Freq: Every day | ORAL | 11 refills | Status: DC

## 2018-07-15 NOTE — Telephone Encounter (Signed)
Pt calling for refill of low dose Norethindrone 5mg . [phone] Left detailed msg for pt to call and verify pharmacy.

## 2018-07-15 NOTE — Telephone Encounter (Signed)
Called in the camila 0.35mg 

## 2018-07-15 NOTE — Telephone Encounter (Signed)
Pt uses [facility] pharmacy. Please advise

## 2018-07-18 DIAGNOSIS — M5431 Sciatica, right side: Secondary | ICD-10-CM | POA: Diagnosis not present

## 2018-07-18 DIAGNOSIS — M9903 Segmental and somatic dysfunction of lumbar region: Secondary | ICD-10-CM | POA: Diagnosis not present

## 2018-07-18 DIAGNOSIS — M9905 Segmental and somatic dysfunction of pelvic region: Secondary | ICD-10-CM | POA: Diagnosis not present

## 2018-07-18 DIAGNOSIS — M4306 Spondylolysis, lumbar region: Secondary | ICD-10-CM | POA: Diagnosis not present

## 2018-07-19 ENCOUNTER — Ambulatory Visit
Admission: RE | Admit: 2018-07-19 | Discharge: 2018-07-19 | Disposition: A | Discharge status: Home / Self Care | Payer: 59 | Source: Ambulatory Visit | Attending: Urology | Admitting: Urology

## 2018-07-19 DIAGNOSIS — Z9884 Bariatric surgery status: Secondary | ICD-10-CM | POA: Diagnosis not present

## 2018-07-19 DIAGNOSIS — N200 Calculus of kidney: Secondary | ICD-10-CM | POA: Insufficient documentation

## 2018-07-19 DIAGNOSIS — Z87442 Personal history of urinary calculi: Secondary | ICD-10-CM | POA: Diagnosis not present

## 2018-07-19 DIAGNOSIS — R3129 Other microscopic hematuria: Secondary | ICD-10-CM | POA: Insufficient documentation

## 2018-07-21 ENCOUNTER — Other Ambulatory Visit: Payer: Self-pay

## 2018-07-21 DIAGNOSIS — Z1211 Encounter for screening for malignant neoplasm of colon: Secondary | ICD-10-CM

## 2018-07-22 DIAGNOSIS — M4306 Spondylolysis, lumbar region: Secondary | ICD-10-CM | POA: Diagnosis not present

## 2018-07-22 DIAGNOSIS — M5431 Sciatica, right side: Secondary | ICD-10-CM | POA: Diagnosis not present

## 2018-07-22 DIAGNOSIS — M9905 Segmental and somatic dysfunction of pelvic region: Secondary | ICD-10-CM | POA: Diagnosis not present

## 2018-07-22 DIAGNOSIS — M9903 Segmental and somatic dysfunction of lumbar region: Secondary | ICD-10-CM | POA: Diagnosis not present

## 2018-07-25 DIAGNOSIS — M9905 Segmental and somatic dysfunction of pelvic region: Secondary | ICD-10-CM | POA: Diagnosis not present

## 2018-07-25 DIAGNOSIS — M4306 Spondylolysis, lumbar region: Secondary | ICD-10-CM | POA: Diagnosis not present

## 2018-07-25 DIAGNOSIS — M5431 Sciatica, right side: Secondary | ICD-10-CM | POA: Diagnosis not present

## 2018-07-25 DIAGNOSIS — M9903 Segmental and somatic dysfunction of lumbar region: Secondary | ICD-10-CM | POA: Diagnosis not present

## 2018-07-26 ENCOUNTER — Telehealth: Payer: Self-pay | Admitting: Urology

## 2018-07-26 ENCOUNTER — Telehealth: Payer: Self-pay

## 2018-07-26 ENCOUNTER — Other Ambulatory Visit: Payer: Self-pay | Admitting: Obstetrics and Gynecology

## 2018-07-26 MED ORDER — NORETHINDRONE ACETATE 5 MG PO TABS: 5.0000 mg | ORAL_TABLET | Freq: Every day | ORAL | 11 refills | Status: DC

## 2018-07-26 NOTE — Telephone Encounter (Signed)
Updated to 5mg 

## 2018-07-26 NOTE — Telephone Encounter (Signed)
Pt called office asking to speak to clinical staff or MD about her CT results she's seen on her Mychart, Pt has questions and concerns. Please advise pt at [phone]. Pt works in [place] and wants call after 3:30 so she can actually discuss this. Please advise. Thanks.

## 2018-07-26 NOTE — Telephone Encounter (Signed)
Norethindrone 0.35mg  was called in.  Pt did not have good results with this.  She went back to the 5mg  and is having excellent results.  Please send in the 5mg .  [phone]

## 2018-07-26 NOTE — Telephone Encounter (Signed)
advise

## 2018-07-27 NOTE — Telephone Encounter (Signed)
I sent patient a my chart message

## 2018-07-27 NOTE — Telephone Encounter (Signed)
Dr. Bernardo Heater,   I spoke with pt and she saw your message about her results and she just had an additional question. She states she has been having continuous right sided back pain since the spring and has been seeing a chiropractor but not getting much relief. She is concerned that this may be due to the stone. She wanted to know to do you feel like the stone should be blasted.

## 2018-07-27 NOTE — Telephone Encounter (Signed)
Pt aware.

## 2018-08-02 ENCOUNTER — Ambulatory Visit (INDEPENDENT_AMBULATORY_CARE_PROVIDER_SITE_OTHER): Payer: 59 | Admitting: Vascular Surgery

## 2018-08-03 ENCOUNTER — Other Ambulatory Visit: Payer: Self-pay | Admitting: Radiology

## 2018-08-03 ENCOUNTER — Telehealth: Payer: Self-pay | Admitting: Radiology

## 2018-08-03 DIAGNOSIS — N200 Calculus of kidney: Secondary | ICD-10-CM

## 2018-08-03 NOTE — Telephone Encounter (Signed)
Spoke with patient about request for shockwave lithotripsy on 08/25/2018. Advised patient to get a KUB & urine culture on 08/15/2018 to determine if ESWL is an appropriate treatment for her renal stone. Patient will be contacted when results are available to discuss further. Questions answered. Patient expresses understanding of conversation.

## 2018-08-04 ENCOUNTER — Other Ambulatory Visit: Payer: Self-pay | Admitting: Urology

## 2018-08-04 DIAGNOSIS — N200 Calculus of kidney: Secondary | ICD-10-CM

## 2018-08-15 ENCOUNTER — Ambulatory Visit
Admission: RE | Admit: 2018-08-15 | Discharge: 2018-08-15 | Disposition: A | Discharge status: Home / Self Care | Payer: 59 | Attending: Urology | Admitting: Urology

## 2018-08-15 ENCOUNTER — Encounter: Payer: Self-pay | Admitting: *Deleted

## 2018-08-15 ENCOUNTER — Ambulatory Visit
Admission: RE | Admit: 2018-08-15 | Discharge: 2018-08-15 | Disposition: A | Discharge status: Home / Self Care | Payer: 59 | Source: Ambulatory Visit | Attending: Urology | Admitting: Urology

## 2018-08-15 ENCOUNTER — Other Ambulatory Visit: Payer: Self-pay

## 2018-08-15 ENCOUNTER — Other Ambulatory Visit
Admission: RE | Admit: 2018-08-15 | Discharge: 2018-08-15 | Disposition: A | Discharge status: Home / Self Care | Payer: 59 | Source: Home / Self Care | Attending: Urology | Admitting: Urology

## 2018-08-15 DIAGNOSIS — N200 Calculus of kidney: Secondary | ICD-10-CM | POA: Diagnosis not present

## 2018-08-16 ENCOUNTER — Encounter: Payer: Self-pay | Admitting: Anesthesiology

## 2018-08-16 ENCOUNTER — Ambulatory Visit (INDEPENDENT_AMBULATORY_CARE_PROVIDER_SITE_OTHER): Payer: 59 | Admitting: Vascular Surgery

## 2018-08-17 ENCOUNTER — Other Ambulatory Visit: Payer: Self-pay | Admitting: Radiology

## 2018-08-17 DIAGNOSIS — N200 Calculus of kidney: Secondary | ICD-10-CM

## 2018-08-17 LAB — URINE CULTURE: Culture: NO GROWTH

## 2018-08-17 NOTE — Telephone Encounter (Signed)
Notified patient of extracorporeal shockwave lithotripsy scheduled on 08/25/2018 at 7:15. Patient should arrive at that time to [facility] Day Surgery. Pre-op & discharge instructions for lithotripsy were provided to patient.    Advised patient to hold NSAIDS & aspirin products for 3 days prior to the procedure beginning on 08/22/2018.  Patient's questions were answered & she expresses understanding of these instructions.    Ranell Patrick, RN

## 2018-08-17 NOTE — Telephone Encounter (Signed)
-----   Message from Abbie Sons, MD sent at 08/16/2018  8:32 PM EST ----- Joaquim Lai is easily visualized on KUB.  Can schedule shockwave lithotripsy if she desires.

## 2018-08-18 ENCOUNTER — Other Ambulatory Visit: Payer: Self-pay | Admitting: Radiology

## 2018-08-18 DIAGNOSIS — N200 Calculus of kidney: Secondary | ICD-10-CM

## 2018-08-24 MED ORDER — CIPROFLOXACIN HCL 500 MG PO TABS
500.0000 mg | ORAL_TABLET | ORAL | Status: AC
  Administered 2018-08-25: 500 mg via ORAL

## 2018-08-25 ENCOUNTER — Other Ambulatory Visit: Payer: Self-pay

## 2018-08-25 ENCOUNTER — Ambulatory Visit
Admission: RE | Admit: 2018-08-25 | Discharge: 2018-08-25 | Disposition: A | Discharge status: Home / Self Care | Payer: 59 | Attending: Urology | Admitting: Urology

## 2018-08-25 ENCOUNTER — Ambulatory Visit: Payer: 59

## 2018-08-25 ENCOUNTER — Encounter: Payer: Self-pay | Admitting: *Deleted

## 2018-08-25 ENCOUNTER — Encounter
Admission: RE | Disposition: A | Discharge status: Home / Self Care | Payer: Self-pay | Source: Home / Self Care | Attending: Urology

## 2018-08-25 DIAGNOSIS — Z9884 Bariatric surgery status: Secondary | ICD-10-CM | POA: Diagnosis not present

## 2018-08-25 DIAGNOSIS — Z1211 Encounter for screening for malignant neoplasm of colon: Secondary | ICD-10-CM

## 2018-08-25 DIAGNOSIS — Z85828 Personal history of other malignant neoplasm of skin: Secondary | ICD-10-CM | POA: Diagnosis not present

## 2018-08-25 DIAGNOSIS — N200 Calculus of kidney: Secondary | ICD-10-CM | POA: Insufficient documentation

## 2018-08-25 DIAGNOSIS — I10 Essential (primary) hypertension: Secondary | ICD-10-CM | POA: Diagnosis not present

## 2018-08-25 HISTORY — PX: EXTRACORPOREAL SHOCK WAVE LITHOTRIPSY: SHX1557

## 2018-08-25 SURGERY — LITHOTRIPSY, ESWL
Anesthesia: Moderate Sedation | Laterality: Right

## 2018-08-25 MED ORDER — TAMSULOSIN HCL 0.4 MG PO CAPS: 0.4000 mg | ORAL_CAPSULE | Freq: Every day | ORAL | 0 refills | Status: DC

## 2018-08-25 MED ORDER — DIAZEPAM 5 MG PO TABS
10.0000 mg | ORAL_TABLET | ORAL | Status: AC
  Administered 2018-08-25: 10 mg via ORAL

## 2018-08-25 MED ORDER — DIPHENHYDRAMINE HCL 25 MG PO CAPS
ORAL_CAPSULE | ORAL | Status: AC
  Administered 2018-08-25: 25 mg via ORAL
  Filled 2018-08-25: qty 1

## 2018-08-25 MED ORDER — SODIUM CHLORIDE 0.9 % IV SOLN: INTRAVENOUS | Status: DC

## 2018-08-25 MED ORDER — CIPROFLOXACIN HCL 500 MG PO TABS
ORAL_TABLET | ORAL | Status: AC
  Administered 2018-08-25: 500 mg via ORAL
  Filled 2018-08-25: qty 1

## 2018-08-25 MED ORDER — DIPHENHYDRAMINE HCL 25 MG PO CAPS
25.0000 mg | ORAL_CAPSULE | ORAL | Status: AC
  Administered 2018-08-25: 25 mg via ORAL

## 2018-08-25 MED ORDER — ONDANSETRON HCL 4 MG/2ML IJ SOLN
INTRAMUSCULAR | Status: AC
  Administered 2018-08-25: 4 mg via INTRAVENOUS
  Filled 2018-08-25: qty 2

## 2018-08-25 MED ORDER — HYDROCODONE-ACETAMINOPHEN 5-325 MG PO TABS: 1.0000 | ORAL_TABLET | ORAL | 0 refills | Status: AC | PRN

## 2018-08-25 MED ORDER — HYDROCODONE-ACETAMINOPHEN 5-325 MG PO TABS
ORAL_TABLET | ORAL | Status: AC
  Filled 2018-08-25: qty 1

## 2018-08-25 MED ORDER — HYDROCODONE-ACETAMINOPHEN 5-325 MG PO TABS
1.0000 | ORAL_TABLET | ORAL | Status: DC | PRN
  Administered 2018-08-25: 1 via ORAL

## 2018-08-25 MED ORDER — ONDANSETRON HCL 4 MG/2ML IJ SOLN
4.0000 mg | Freq: Once | INTRAMUSCULAR | Status: AC
  Administered 2018-08-25: 4 mg via INTRAVENOUS

## 2018-08-25 MED ORDER — ONDANSETRON HCL 2 MG/ML IV SOLN: 4.0000 mg | Freq: Once | INTRAVENOUS | Status: DC

## 2018-08-25 MED ORDER — SODIUM CHLORIDE 0.9 % IV SOLN
INTRAVENOUS | Status: DC
  Administered 2018-08-25: 07:00:00 via INTRAVENOUS

## 2018-08-25 MED ORDER — DIAZEPAM 5 MG PO TABS
ORAL_TABLET | ORAL | Status: AC
  Administered 2018-08-25: 10 mg via ORAL
  Filled 2018-08-25: qty 2

## 2018-08-25 MED ORDER — ONDANSETRON HCL 4 MG PO TABS: 4.0000 mg | ORAL_TABLET | Freq: Every day | ORAL | 0 refills | Status: AC | PRN

## 2018-08-25 NOTE — H&P (Signed)
   7:40 AM   Elizabeth Good 11/20/1965 [id number]   HPI: 52 year old female here today for right SWL for 40mm non-obstructing lower pole stone. Denies fevers/chills, chest pain, SOB.   Urine culture 12/16 negative.   PMH: Past Medical History:  Diagnosis Date  . Allergy   . Anemia   . Bartholin cyst 1990  . Basal cell carcinoma   . Curvature of spine   . Family history of adverse reaction to anesthesia    Mother - PONV  . GERD (gastroesophageal reflux disease)   . High cholesterol   . History of bladder infections   . History of kidney infection   . History of kidney stones   . Hypertension   . Kidney stones   . Meningitis    Age 70  . PONV (postoperative nausea and vomiting)   . Renal disorder   . Seizures ([place])    none since 05/1997.  on medication  . Wears contact lenses     Surgical History: Past Surgical History:  Procedure Laterality Date  . ANTERIOR AND POSTERIOR VAGINAL REPAIR     [facility]  . BLADDER SURGERY    . CHOLECYSTECTOMY  2011  . COMBINED HYSTEROSCOPY DIAGNOSTIC / D&C  12/06/2014   [facility]  . ENDOMETRIAL ABLATION  2011   [facility]  Dr. Amalia Hailey  . FOOT SURGERY Left   . GANGLION CYST EXCISION    . GASTRIC BYPASS  2011  . INCONTINENCE SURGERY    . RECTAL SURGERY    . SEPTOPLASTY    . TONSILLECTOMY      Allergies:  Allergies  Allergen Reactions  . Latex Rash    Dental appliances, balloons, gloves, condoms  . Penicillins Rash  . Sulfa Antibiotics Rash    Family History: Family History  Problem Relation Age of Onset  . Bladder Cancer Mother   . Prostate cancer Father   . Melanoma Father   . Breast cancer Cousin     Social History:  reports that she has never smoked. She has never used smokeless tobacco. She reports current alcohol use. She reports that she does not use drugs.  ROS: Please see flowsheet from today's date for complete review of systems.  Physical Exam: BP 129/85   Pulse 84   Temp 98.4 F (36.9  C) (Temporal)   Resp 18   Ht 5\' 5"  (1.651 m)   Wt 81.6 kg   SpO2 100%   BMI 29.95 kg/m    Constitutional:  Alert and oriented, No acute distress. Cardiovascular: RRR Respiratory: CTA Bilaterally GI: Abdomen is soft, nontender, nondistended, no abdominal masses Lymph: No cervical or inguinal lymphadenopathy. Skin: No rashes, bruises or suspicious lesions. Neurologic: Grossly intact, no focal deficits, moving all 4 extremities. Psychiatric: Normal mood and affect.  Laboratory Data: Urine cx 12/16 no growth  Pertinent Imaging: I have personally reviewed the CT and KUB, clearly visible 64mm right lower pole stone.  Assessment & Plan:   Elizabeth Good is a 52 year old female that desires treatment of a right 63mm non-obstructing lower pole stone. Informed consent obtained, we discussed the risks of bleeding, hematoma, infection, incomplete fragmentation, steinstrasse, and possible need for additional procedures.  Proceed with [place], MD  [facility] [address], [place], [place] [address] [phone]

## 2018-08-25 NOTE — Discharge Instructions (Signed)

## 2018-08-26 ENCOUNTER — Encounter: Payer: Self-pay | Admitting: Urology

## 2018-08-29 ENCOUNTER — Ambulatory Visit: Admission: RE | Admit: 2018-08-29 | Payer: 59 | Source: Home / Self Care | Admitting: Gastroenterology

## 2018-08-29 HISTORY — DX: Essential (primary) hypertension: I10

## 2018-08-29 HISTORY — DX: Other specified postprocedural states: Z98.890

## 2018-08-29 HISTORY — DX: Anemia, unspecified: D64.9

## 2018-08-29 HISTORY — DX: Other specified postprocedural states: R11.2

## 2018-08-29 HISTORY — DX: Presence of spectacles and contact lenses: Z97.3

## 2018-08-29 HISTORY — DX: Calculus of kidney: N20.0

## 2018-08-29 HISTORY — DX: Deforming dorsopathy, unspecified: M43.9

## 2018-08-29 HISTORY — DX: Family history of other specified conditions: Z84.89

## 2018-08-29 SURGERY — COLONOSCOPY WITH PROPOFOL
Anesthesia: Choice

## 2018-08-31 HISTORY — PX: OOPHORECTOMY: SHX86

## 2018-09-14 ENCOUNTER — Ambulatory Visit: Payer: 59 | Admitting: Urology

## 2018-09-26 DIAGNOSIS — M9903 Segmental and somatic dysfunction of lumbar region: Secondary | ICD-10-CM | POA: Diagnosis not present

## 2018-09-26 DIAGNOSIS — M4306 Spondylolysis, lumbar region: Secondary | ICD-10-CM | POA: Diagnosis not present

## 2018-09-26 DIAGNOSIS — M9905 Segmental and somatic dysfunction of pelvic region: Secondary | ICD-10-CM | POA: Diagnosis not present

## 2018-09-26 DIAGNOSIS — M5431 Sciatica, right side: Secondary | ICD-10-CM | POA: Diagnosis not present

## 2018-09-28 ENCOUNTER — Ambulatory Visit
Admission: RE | Admit: 2018-09-28 | Discharge: 2018-09-28 | Disposition: A | Discharge status: Home / Self Care | Payer: 59 | Attending: Urology | Admitting: Urology

## 2018-09-28 ENCOUNTER — Encounter: Payer: Self-pay | Admitting: Urology

## 2018-09-28 ENCOUNTER — Ambulatory Visit (INDEPENDENT_AMBULATORY_CARE_PROVIDER_SITE_OTHER): Payer: 59 | Admitting: Urology

## 2018-09-28 ENCOUNTER — Ambulatory Visit
Admission: RE | Admit: 2018-09-28 | Discharge: 2018-09-28 | Disposition: A | Discharge status: Home / Self Care | Payer: 59 | Source: Ambulatory Visit | Attending: Urology | Admitting: Urology

## 2018-09-28 VITALS — BP 144/91 | HR 91 | Ht 65.0 in | Wt 185.0 lb

## 2018-09-28 DIAGNOSIS — K219 Gastro-esophageal reflux disease without esophagitis: Secondary | ICD-10-CM | POA: Insufficient documentation

## 2018-09-28 DIAGNOSIS — N200 Calculus of kidney: Secondary | ICD-10-CM

## 2018-09-28 DIAGNOSIS — R569 Unspecified convulsions: Secondary | ICD-10-CM | POA: Insufficient documentation

## 2018-09-28 NOTE — Progress Notes (Signed)
09/28/2018 1:22 PM   [name] August 19, 1966 [id number]  Referring provider: Gayland Curry, MD [address] [place], [place] [address]  Chief Complaint  Patient presents with  . Nephrolithiasis    HPI: 53 year old female presents for post ESWL follow-up.  She underwent shockwave lithotripsy of 5 mm nonobstructing right lower pole calculus by Dr. Diamantina Providence on 12/26.  She was having intermittent right flank pain and states since the procedure her pain has resolved.  She did bring in several fragments today however prior analysis has been calcium oxalate.  KUB performed today was reviewed and there has been fragmentation of the calculus.  There is an approximately 2 mm fragment remaining.  She did have a 24-hour urine in October which showed a urine volume >4 L.  Her urine calcium was elevated at 400 mg per 24 hours.  She was started on a thiazide diuretic for hypertension in November 2019.  Serum calcium and parathyroid hormone levels were normal.   PMH: Past Medical History:  Diagnosis Date  . Allergy   . Anemia   . Bartholin cyst 1990  . Basal cell carcinoma   . Curvature of spine   . Family history of adverse reaction to anesthesia    Mother - PONV  . GERD (gastroesophageal reflux disease)   . High cholesterol   . History of bladder infections   . History of kidney infection   . History of kidney stones   . Hypertension   . Kidney stones   . Meningitis    Age 14  . PONV (postoperative nausea and vomiting)   . Renal disorder   . Seizures ([place])    none since 05/1997.  on medication  . Wears contact lenses     Surgical History: Past Surgical History:  Procedure Laterality Date  . ANTERIOR AND POSTERIOR VAGINAL REPAIR     [facility]  . BLADDER SURGERY    . CHOLECYSTECTOMY  2011  . COMBINED HYSTEROSCOPY DIAGNOSTIC / D&C  12/06/2014   [facility]  . ENDOMETRIAL ABLATION  2011   [facility]  Dr. Amalia Hailey  . EXTRACORPOREAL SHOCK WAVE LITHOTRIPSY  Right 08/25/2018   Procedure: EXTRACORPOREAL SHOCK WAVE LITHOTRIPSY (ESWL);  Surgeon: Billey Co, MD;  Location: [facility] ORS;  Service: Urology;  Laterality: Right;  . FOOT SURGERY Left   . GANGLION CYST EXCISION    . GASTRIC BYPASS  2011  . INCONTINENCE SURGERY    . RECTAL SURGERY    . SEPTOPLASTY    . TONSILLECTOMY      Home Medications:  Allergies as of 09/28/2018      Reactions   Latex Rash   Dental appliances, balloons, gloves, condoms   Penicillins Rash   Sulfa Antibiotics Rash      Medication List       Accurate as of September 28, 2018  1:22 PM. Always use your most recent med list.        Calcium-Vitamin D 600-200 MG-UNIT tablet Take by mouth.   cetirizine 10 MG tablet Commonly known as:  ZYRTEC Take 10 mg by mouth daily.   dexlansoprazole 60 MG capsule Commonly known as:  DEXILANT Take 60 mg by mouth daily.   LamoTRIgine 200 MG Tb24 24 hour tablet   multivitamin tablet Take 1 tablet by mouth daily.   norethindrone 5 MG tablet Commonly known as:  AYGESTIN Take 1 tablet (5 mg total) by mouth daily.   tamsulosin 0.4 MG Caps capsule Commonly known as:  FLOMAX Take  1 capsule (0.4 mg total) by mouth daily after supper.   triamterene-hydrochlorothiazide 37.5-25 MG tablet Commonly known as:  MAXZIDE-25 Take 1 tablet by mouth daily.   vitamin B-12 1000 MCG tablet Commonly known as:  CYANOCOBALAMIN Frequency:QD   Dosage:0.0     Instructions:  Note:Dose: ?   vitamin C 250 MG tablet Commonly known as:  ASCORBIC ACID Take 250 mg by mouth daily.   Vitamin D3 125 MCG (5000 UT) Tabs Frequency:QD   Dosage:0.0     Instructions:  Note:Dose: ?       Allergies:  Allergies  Allergen Reactions  . Latex Rash    Dental appliances, balloons, gloves, condoms  . Penicillins Rash  . Sulfa Antibiotics Rash    Family History: Family History  Problem Relation Age of Onset  . Bladder Cancer Mother   . Prostate cancer Father   . Melanoma Father   . Breast  cancer Cousin     Social History:  reports that she has never smoked. She has never used smokeless tobacco. She reports current alcohol use. She reports that she does not use drugs.  ROS: UROLOGY Frequent Urination?: No Hard to postpone urination?: No Burning/pain with urination?: No Get up at night to urinate?: No Leakage of urine?: No Urine stream starts and stops?: No Trouble starting stream?: No Do you have to strain to urinate?: No Blood in urine?: No Urinary tract infection?: No Sexually transmitted disease?: No Injury to kidneys or bladder?: No Painful intercourse?: No Weak stream?: No Currently pregnant?: No Vaginal bleeding?: No Last menstrual period?: n  Gastrointestinal Nausea?: No Vomiting?: No Indigestion/heartburn?: No Diarrhea?: No Constipation?: No  Constitutional Fever: No Night sweats?: No Weight loss?: No Fatigue?: No  Skin Skin rash/lesions?: No Itching?: No  Eyes Blurred vision?: No Double vision?: No  Ears/Nose/Throat Sore throat?: No Sinus problems?: No  Hematologic/Lymphatic Swollen glands?: No Easy bruising?: No  Cardiovascular Leg swelling?: No Chest pain?: No  Respiratory Cough?: No Shortness of breath?: No  Endocrine Excessive thirst?: No  Musculoskeletal Back pain?: No Joint pain?: No  Neurological Headaches?: No Dizziness?: No  Psychologic Depression?: No Anxiety?: No  Physical Exam: BP (!) 144/91   Pulse 91   Ht 5\' 5"  (1.651 m)   Wt 185 lb (83.9 kg)   BMI 30.79 kg/m   Constitutional:  Alert and oriented, No acute distress.    Assessment & Plan:   Status post shockwave lithotripsy for a nonobstructing renal calculus.  Although nonobstructing stone typically do not cause pain she states her pain has resolved.  She has a small remaining fragment and will monitor with a follow-up KUB in 6 months.  Recommend a repeat 24-hour urine study for calcium around April 2020.  Abbie Sons,  [facility] [address] [place], [place] [address] [phone]

## 2018-10-31 DIAGNOSIS — M5431 Sciatica, right side: Secondary | ICD-10-CM | POA: Diagnosis not present

## 2018-10-31 DIAGNOSIS — M9903 Segmental and somatic dysfunction of lumbar region: Secondary | ICD-10-CM | POA: Diagnosis not present

## 2018-10-31 DIAGNOSIS — M4306 Spondylolysis, lumbar region: Secondary | ICD-10-CM | POA: Diagnosis not present

## 2018-10-31 DIAGNOSIS — M9905 Segmental and somatic dysfunction of pelvic region: Secondary | ICD-10-CM | POA: Diagnosis not present

## 2018-11-01 ENCOUNTER — Ambulatory Visit: Payer: 59 | Admitting: Obstetrics and Gynecology

## 2018-11-08 ENCOUNTER — Other Ambulatory Visit: Payer: Self-pay

## 2018-11-10 ENCOUNTER — Ambulatory Visit: Payer: Self-pay

## 2018-11-10 ENCOUNTER — Ambulatory Visit: Payer: Self-pay | Admitting: Oncology

## 2018-11-14 ENCOUNTER — Inpatient Hospital Stay: Payer: 59

## 2018-11-15 ENCOUNTER — Encounter: Payer: Self-pay | Admitting: *Deleted

## 2018-11-16 ENCOUNTER — Inpatient Hospital Stay: Payer: 59

## 2018-11-16 ENCOUNTER — Inpatient Hospital Stay: Payer: 59 | Admitting: Oncology

## 2018-11-28 DIAGNOSIS — M9903 Segmental and somatic dysfunction of lumbar region: Secondary | ICD-10-CM | POA: Diagnosis not present

## 2018-11-28 DIAGNOSIS — M4306 Spondylolysis, lumbar region: Secondary | ICD-10-CM | POA: Diagnosis not present

## 2018-11-28 DIAGNOSIS — M5431 Sciatica, right side: Secondary | ICD-10-CM | POA: Diagnosis not present

## 2018-11-28 DIAGNOSIS — M9905 Segmental and somatic dysfunction of pelvic region: Secondary | ICD-10-CM | POA: Diagnosis not present

## 2018-12-27 DIAGNOSIS — M9905 Segmental and somatic dysfunction of pelvic region: Secondary | ICD-10-CM | POA: Diagnosis not present

## 2018-12-27 DIAGNOSIS — M9903 Segmental and somatic dysfunction of lumbar region: Secondary | ICD-10-CM | POA: Diagnosis not present

## 2018-12-27 DIAGNOSIS — M4306 Spondylolysis, lumbar region: Secondary | ICD-10-CM | POA: Diagnosis not present

## 2018-12-27 DIAGNOSIS — M5431 Sciatica, right side: Secondary | ICD-10-CM | POA: Diagnosis not present

## 2019-01-24 ENCOUNTER — Ambulatory Visit: Payer: 59 | Admitting: Obstetrics and Gynecology

## 2019-01-24 DIAGNOSIS — M25572 Pain in left ankle and joints of left foot: Secondary | ICD-10-CM | POA: Diagnosis not present

## 2019-01-24 DIAGNOSIS — S93422A Sprain of deltoid ligament of left ankle, initial encounter: Secondary | ICD-10-CM | POA: Diagnosis not present

## 2019-02-08 DIAGNOSIS — M9903 Segmental and somatic dysfunction of lumbar region: Secondary | ICD-10-CM | POA: Diagnosis not present

## 2019-02-08 DIAGNOSIS — M9905 Segmental and somatic dysfunction of pelvic region: Secondary | ICD-10-CM | POA: Diagnosis not present

## 2019-02-08 DIAGNOSIS — M5431 Sciatica, right side: Secondary | ICD-10-CM | POA: Diagnosis not present

## 2019-02-08 DIAGNOSIS — M4306 Spondylolysis, lumbar region: Secondary | ICD-10-CM | POA: Diagnosis not present

## 2019-03-06 DIAGNOSIS — M5431 Sciatica, right side: Secondary | ICD-10-CM | POA: Diagnosis not present

## 2019-03-06 DIAGNOSIS — M9905 Segmental and somatic dysfunction of pelvic region: Secondary | ICD-10-CM | POA: Diagnosis not present

## 2019-03-06 DIAGNOSIS — M9903 Segmental and somatic dysfunction of lumbar region: Secondary | ICD-10-CM | POA: Diagnosis not present

## 2019-03-06 DIAGNOSIS — M4306 Spondylolysis, lumbar region: Secondary | ICD-10-CM | POA: Diagnosis not present

## 2019-03-13 DIAGNOSIS — Z0000 Encounter for general adult medical examination without abnormal findings: Secondary | ICD-10-CM | POA: Diagnosis not present

## 2019-03-13 DIAGNOSIS — Z9884 Bariatric surgery status: Secondary | ICD-10-CM | POA: Diagnosis not present

## 2019-03-13 DIAGNOSIS — Z79899 Other long term (current) drug therapy: Secondary | ICD-10-CM | POA: Diagnosis not present

## 2019-03-13 DIAGNOSIS — R569 Unspecified convulsions: Secondary | ICD-10-CM | POA: Diagnosis not present

## 2019-03-13 DIAGNOSIS — Z1159 Encounter for screening for other viral diseases: Secondary | ICD-10-CM | POA: Diagnosis not present

## 2019-03-30 DIAGNOSIS — H6121 Impacted cerumen, right ear: Secondary | ICD-10-CM | POA: Diagnosis not present

## 2019-03-30 DIAGNOSIS — H9209 Otalgia, unspecified ear: Secondary | ICD-10-CM | POA: Diagnosis not present

## 2019-03-31 ENCOUNTER — Ambulatory Visit: Payer: 59 | Admitting: Urology

## 2019-04-04 ENCOUNTER — Telehealth: Payer: Self-pay

## 2019-04-04 DIAGNOSIS — M9903 Segmental and somatic dysfunction of lumbar region: Secondary | ICD-10-CM | POA: Diagnosis not present

## 2019-04-04 DIAGNOSIS — M9905 Segmental and somatic dysfunction of pelvic region: Secondary | ICD-10-CM | POA: Diagnosis not present

## 2019-04-04 DIAGNOSIS — M4306 Spondylolysis, lumbar region: Secondary | ICD-10-CM | POA: Diagnosis not present

## 2019-04-04 DIAGNOSIS — M5431 Sciatica, right side: Secondary | ICD-10-CM | POA: Diagnosis not present

## 2019-04-04 NOTE — Telephone Encounter (Signed)
Pt calling; has scheduled an annual exam for 8/26 and needs a refill of bc to get her to appt.  She states she is on the 0.35mg  norethindrone; was on the 5mg  but had to back it back down to 0.35mg .  Uses [facility] pharmacy.  [phone]  Called pt to clarify strength of norethindrone b/c chart shows the 0.35mg  was discontinued and she was put on the 5mg .  Pt states she had to go back to the 0.35mg  d/t weight gain.  She had some left that she has been taking.

## 2019-04-05 ENCOUNTER — Other Ambulatory Visit: Payer: Self-pay | Admitting: Obstetrics and Gynecology

## 2019-04-05 MED ORDER — NORETHINDRONE 0.35 MG PO TABS: 1.0000 | ORAL_TABLET | Freq: Every day | ORAL | 11 refills | Status: DC

## 2019-04-05 NOTE — Telephone Encounter (Signed)
Pt aware.

## 2019-04-05 NOTE — Telephone Encounter (Signed)
Please advise. 5mg  is showing on her med list. She needs Norethindrone 0.35mg  sent to pharmacy

## 2019-04-05 NOTE — Telephone Encounter (Signed)
Has been sent.

## 2019-04-26 ENCOUNTER — Ambulatory Visit (INDEPENDENT_AMBULATORY_CARE_PROVIDER_SITE_OTHER): Payer: 59 | Admitting: Obstetrics and Gynecology

## 2019-04-26 ENCOUNTER — Encounter: Payer: Self-pay | Admitting: Obstetrics and Gynecology

## 2019-04-26 ENCOUNTER — Other Ambulatory Visit: Payer: Self-pay

## 2019-04-26 VITALS — BP 137/87 | HR 91 | Ht 66.0 in | Wt 202.0 lb

## 2019-04-26 DIAGNOSIS — N951 Menopausal and female climacteric states: Secondary | ICD-10-CM

## 2019-04-26 DIAGNOSIS — Z1239 Encounter for other screening for malignant neoplasm of breast: Secondary | ICD-10-CM

## 2019-04-26 DIAGNOSIS — Z01419 Encounter for gynecological examination (general) (routine) without abnormal findings: Secondary | ICD-10-CM | POA: Diagnosis not present

## 2019-04-26 DIAGNOSIS — Q859 Phakomatosis, unspecified: Secondary | ICD-10-CM

## 2019-04-26 NOTE — Progress Notes (Signed)
Gynecology Annual Exam  PCP: Gayland Curry, MD  Chief Complaint:  Chief Complaint  Patient presents with  . Gynecologic Exam    Hot flashes/hot,cold intolerence/fatigue    History of Present Illness:Patient is a 53 y.o. G2P2 presents for annual exam. The patient has no complaints today.   LMP: No LMP recorded. Patient is perimenopausal. No bleeding on current regimen of camila.  Some vasomotor symptoms  The patient is sexually active. She denies dyspareunia.  The patient does perform self breast exams.  There is no notable family history of breast or ovarian cancer in her family.  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   The patient has regular exercise: not asked.    The patient denies current symptoms of depression.     Review of Systems: ROS  Past Medical History:  Past Medical History:  Diagnosis Date  . Allergy   . Anemia   . Bartholin cyst 1990  . Basal cell carcinoma   . Curvature of spine   . Family history of adverse reaction to anesthesia    Mother - PONV  . GERD (gastroesophageal reflux disease)   . High cholesterol   . History of bladder infections   . History of kidney infection   . History of kidney stones   . Hypertension   . Kidney stones   . Meningitis    Age 77  . PONV (postoperative nausea and vomiting)   . Renal disorder   . Seizures ([place])    none since 05/1997.  on medication  . Wears contact lenses     Past Surgical History:  Past Surgical History:  Procedure Laterality Date  . ANTERIOR AND POSTERIOR VAGINAL REPAIR     [facility]  . BLADDER SURGERY    . CHOLECYSTECTOMY  2011  . COMBINED HYSTEROSCOPY DIAGNOSTIC / D&C  12/06/2014   [facility]  . ENDOMETRIAL ABLATION  2011   [facility]  Dr. Amalia Hailey  . EXTRACORPOREAL SHOCK WAVE LITHOTRIPSY Right 08/25/2018   Procedure: EXTRACORPOREAL SHOCK WAVE LITHOTRIPSY (ESWL);  Surgeon: Billey Co, MD;   Location: [facility] ORS;  Service: Urology;  Laterality: Right;  . FOOT SURGERY Left   . GANGLION CYST EXCISION    . GASTRIC BYPASS  2011  . INCONTINENCE SURGERY    . RECTAL SURGERY    . SEPTOPLASTY    . TONSILLECTOMY      Gynecologic History:  No LMP recorded. Patient is perimenopausal. Last Pap: Results were: 11/25/2017 NIL and HR HPV negative  Last mammogram: 03/26/2016 Results were: BI-RAD II  Obstetric History: G2P2  Family History:  Family History  Problem Relation Age of Onset  . Bladder Cancer Mother   . Prostate cancer Father   . Melanoma Father   . Breast cancer Cousin     Social History:  Social History   Socioeconomic History  . Marital status: Legally Separated    Spouse name: Not on file  . Number of children: Not on file  . Years of education: Not on file  . Highest education level: Not on file  Occupational History  . Not on file  Social Needs  . Financial resource strain: Not on file  . Food insecurity    Worry: Not on file    Inability: Not on file  . Transportation needs    Medical: Not on file    Non-medical: Not on file  Tobacco Use  .  Smoking status: Never Smoker  . Smokeless tobacco: Never Used  Substance and Sexual Activity  . Alcohol use: Yes    Alcohol/week: 0.0 standard drinks    Comment: social - 1 glass wine/month  . Drug use: No  . Sexual activity: Yes    Birth control/protection: Pill  Lifestyle  . Physical activity    Days per week: Not on file    Minutes per session: Not on file  . Stress: Not on file  Relationships  . Social Herbalist on phone: Not on file    Gets together: Not on file    Attends religious service: Not on file    Active member of club or organization: Not on file    Attends meetings of clubs or organizations: Not on file    Relationship status: Not on file  . Intimate partner violence    Fear of current or ex partner: Not on file    Emotionally abused: Not on file    Physically abused: Not on  file    Forced sexual activity: Not on file  Other Topics Concern  . Not on file  Social History Narrative  . Not on file    Allergies:  Allergies  Allergen Reactions  . Latex Rash    Dental appliances, balloons, gloves, condoms  . Penicillins Rash  . Sulfa Antibiotics Rash    Medications: Prior to Admission medications   Medication Sig Start Date End Date Taking? Authorizing Provider  Calcium-Vitamin D 600-200 MG-UNIT tablet Take by mouth. 10/12/11   [provider]  cetirizine (ZYRTEC) 10 MG tablet Take 10 mg by mouth daily.    [provider]  Cholecalciferol (VITAMIN D3) 5000 units TABS Frequency:QD   Dosage:0.0     Instructions:  Note:Dose: ? 10/12/11   [provider]  dexlansoprazole (DEXILANT) 60 MG capsule Take 60 mg by mouth daily.    [provider]  LamoTRIgine 200 MG TB24 24 hour tablet  05/30/18   [provider]  Multiple Vitamin (MULTIVITAMIN) tablet Take 1 tablet by mouth daily.    [provider]  norethindrone (MICRONOR) 0.35 MG tablet Take 1 tablet (0.35 mg total) by mouth daily. 04/05/19   Malachy Mood, MD  tamsulosin (FLOMAX) 0.4 MG CAPS capsule Take 1 capsule (0.4 mg total) by mouth daily after supper. 08/25/18   Billey Co, MD  triamterene-hydrochlorothiazide (MAXZIDE-25) 37.5-25 MG tablet Take 1 tablet by mouth daily.    [provider]  vitamin B-12 (CYANOCOBALAMIN) 1000 MCG tablet Frequency:QD   Dosage:0.0     Instructions:  Note:Dose: ? 10/12/11   [provider]  vitamin C (ASCORBIC ACID) 250 MG tablet Take 250 mg by mouth daily.    [provider]    Physical Exam Vitals: There were no vitals taken for this visit.  General: NAD HEENT: normocephalic, anicteric Thyroid: no enlargement, no palpable nodules Pulmonary: No increased work of breathing, CTAB Cardiovascular: RRR, distal pulses 2+ Breast: Breast symmetrical, no tenderness, no palpable nodules or masses,  no skin or nipple retraction present, no nipple discharge.  No axillary or supraclavicular lymphadenopathy. Abdomen: NABS, soft, non-tender, non-distended.  Umbilicus without lesions.  No hepatomegaly, splenomegaly or masses palpable. No evidence of hernia  Genitourinary:  External: Normal external female genitalia.  Normal urethral meatus, normal Bartholin's and Skene's glands.    Vagina: Normal vaginal mucosa, no evidence of prolapse.    Cervix: Grossly normal in appearance, no bleeding  Uterus: Non-enlarged, mobile, normal  contour.  No CMT  Adnexa: ovaries non-enlarged, no adnexal masses  Rectal: deferred  Lymphatic: no evidence of inguinal lymphadenopathy Extremities: no edema, erythema, or tenderness Neurologic: Grossly intact Psychiatric: mood appropriate, affect full  Female chaperone present for pelvic and breast  portions of the physical exam     Assessment: 53 y.o. 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  Mammogram Was ordered today  - diagnostic given known hamertoma left breast  2) STI screening  was notoffered and therefore not obtained  3) ASCCP guidelines and rational discussed.  Patient opts for every 3 years screening interval  4) Osteoporosis  - per USPTF routine screening DEXA at age 50  5) Routine healthcare maintenance including cholesterol, diabetes  screening discussed managed by PCP  8) Return in about 1 year (around 04/25/2020) for annual.    Malachy Mood, MD [place] [place], [facility] 04/26/2019, 8:30 AM

## 2019-04-27 ENCOUNTER — Other Ambulatory Visit: Payer: Self-pay

## 2019-04-27 ENCOUNTER — Encounter: Payer: Self-pay | Admitting: Urology

## 2019-04-27 ENCOUNTER — Ambulatory Visit
Admission: RE | Admit: 2019-04-27 | Discharge: 2019-04-27 | Disposition: A | Discharge status: Home / Self Care | Payer: 59 | Attending: Urology | Admitting: Urology

## 2019-04-27 ENCOUNTER — Ambulatory Visit
Admission: RE | Admit: 2019-04-27 | Discharge: 2019-04-27 | Disposition: A | Discharge status: Home / Self Care | Payer: 59 | Source: Ambulatory Visit | Attending: Urology | Admitting: Urology

## 2019-04-27 ENCOUNTER — Ambulatory Visit: Payer: 59 | Admitting: Urology

## 2019-04-27 VITALS — BP 133/77 | HR 101 | Ht 64.7 in | Wt 199.0 lb

## 2019-04-27 DIAGNOSIS — N200 Calculus of kidney: Secondary | ICD-10-CM

## 2019-04-27 LAB — ESTRADIOL: Estradiol: 35.3 pg/mL

## 2019-04-27 LAB — FOLLICLE STIMULATING HORMONE: FSH: 43.3 m[IU]/mL

## 2019-04-27 NOTE — Progress Notes (Signed)
04/27/2019 1:29 PM   Elizabeth Good Apr 17, 1966 [id number]  Referring provider: Gayland Curry, MD [address] [place],  [place] [address]  Chief Complaint  Patient presents with  . Nephrolithiasis    Urologic history: 1.  Recurrent nephrolithiasis  -Recurrent calcium oxalate stone disease  -Shockwave lithotripsy 2015 and 2019  -Small residual 2 mm fragment   -Metabolic evaluation: Low urine volume, mild hyperoxaluria, low urine magnesium  HPI: 53 y.o. female presents for follow-up of nephrolithiasis.  For the past 2 months she complains of right back pain.  No identifiable precipitating, aggravating or alleviating factors.  KUB performed today was reviewed and there is a significant amount of stool and bowel gas obscuring the renal outlines however the 2 mm calculus is identified.   PMH: Past Medical History:  Diagnosis Date  . Allergy   . Anemia   . Bartholin cyst 1990  . Basal cell carcinoma   . Curvature of spine   . Family history of adverse reaction to anesthesia    Mother - PONV  . GERD (gastroesophageal reflux disease)   . High cholesterol   . History of bladder infections   . History of kidney infection   . History of kidney stones   . Hypertension   . Kidney stones   . Meningitis    Age 60  . PONV (postoperative nausea and vomiting)   . Renal disorder   . Seizures ([place])    none since 05/1997.  on medication  . Wears contact lenses     Surgical History: Past Surgical History:  Procedure Laterality Date  . ANTERIOR AND POSTERIOR VAGINAL REPAIR     [facility]  . BLADDER SURGERY    . CHOLECYSTECTOMY  2011  . COMBINED HYSTEROSCOPY DIAGNOSTIC / D&C  12/06/2014   [facility]  . ENDOMETRIAL ABLATION  2011   [facility]  Dr. Amalia Hailey  . EXTRACORPOREAL SHOCK WAVE LITHOTRIPSY Right 08/25/2018   Procedure: EXTRACORPOREAL SHOCK WAVE LITHOTRIPSY (ESWL);  Surgeon: Billey Co, MD;  Location: [facility] ORS;  Service: Urology;  Laterality: Right;   . FOOT SURGERY Left   . GANGLION CYST EXCISION    . GASTRIC BYPASS  2011  . INCONTINENCE SURGERY    . RECTAL SURGERY    . SEPTOPLASTY    . TONSILLECTOMY      Home Medications:  Allergies as of 04/27/2019      Reactions   Latex Rash   Dental appliances, balloons, gloves, condoms   Penicillins Rash   Sulfa Antibiotics Rash      Medication List       Accurate as of April 27, 2019  1:29 PM. If you have any questions, ask your nurse or doctor.        STOP taking these medications   tamsulosin 0.4 MG Caps capsule Commonly known as: FLOMAX Stopped by: Abbie Sons, MD     TAKE these medications   Calcium-Vitamin D 600-200 MG-UNIT tablet Take by mouth.   cetirizine 10 MG tablet Commonly known as: ZYRTEC Take 10 mg by mouth daily.   dexlansoprazole 60 MG capsule Commonly known as: DEXILANT Take 60 mg by mouth daily.   LamoTRIgine 200 MG Tb24 24 hour tablet What changed: Another medication with the same name was removed. Continue taking this medication, and follow the directions you see here. Changed by: Abbie Sons, MD   multivitamin tablet Take 1 tablet by mouth daily.   norethindrone 0.35 MG tablet Commonly known as: MICRONOR Take  1 tablet (0.35 mg total) by mouth daily.   nystatin powder Commonly known as: MYCOSTATIN/NYSTOP Apply topically.   triamterene-hydrochlorothiazide 37.5-25 MG tablet Commonly known as: MAXZIDE-25 Take 1 tablet by mouth daily.   vitamin B-12 1000 MCG tablet Commonly known as: CYANOCOBALAMIN Frequency:QD   Dosage:0.0     Instructions:  Note:Dose: ?   vitamin C 250 MG tablet Commonly known as: ASCORBIC ACID Take 250 mg by mouth daily.   Vitamin D3 125 MCG (5000 UT) Tabs Frequency:QD   Dosage:0.0     Instructions:  Note:Dose: ?       Allergies:  Allergies  Allergen Reactions  . Latex Rash    Dental appliances, balloons, gloves, condoms  . Penicillins Rash  . Sulfa Antibiotics Rash    Family History: Family  History  Problem Relation Age of Onset  . Bladder Cancer Mother   . Prostate cancer Father   . Melanoma Father   . Breast cancer Cousin     Social History:  reports that she has never smoked. She has never used smokeless tobacco. She reports current alcohol use. She reports that she does not use drugs.  ROS: UROLOGY Frequent Urination?: No Hard to postpone urination?: No Burning/pain with urination?: No Get up at night to urinate?: No Leakage of urine?: No Urine stream starts and stops?: No Trouble starting stream?: No Do you have to strain to urinate?: No Blood in urine?: No Urinary tract infection?: No Sexually transmitted disease?: No Injury to kidneys or bladder?: No Painful intercourse?: No Weak stream?: No Currently pregnant?: No Vaginal bleeding?: No Last menstrual period?: N/A  Gastrointestinal Nausea?: No Vomiting?: No Indigestion/heartburn?: Yes Diarrhea?: No Constipation?: No  Constitutional Fever: No Night sweats?: No Weight loss?: No Fatigue?: No  Skin Skin rash/lesions?: No Itching?: No  Eyes Blurred vision?: No Double vision?: No  Ears/Nose/Throat Sore throat?: No Sinus problems?: Yes  Hematologic/Lymphatic Swollen glands?: No Easy bruising?: No  Cardiovascular Leg swelling?: No Chest pain?: No  Respiratory Cough?: Yes Shortness of breath?: No  Endocrine Excessive thirst?: No  Musculoskeletal Back pain?: No Joint pain?: Yes  Neurological Headaches?: No Dizziness?: No  Psychologic Depression?: No Anxiety?: No  Physical Exam: BP 133/77 (BP Location: Left Arm, Patient Position: Sitting, Cuff Size: Normal)   Pulse (!) 101   Ht 5' 4.7" (1.643 m)   Wt 199 lb (90.3 kg)   BMI 33.42 kg/m   Constitutional:  Alert and oriented, No acute distress. HEENT: [place] AT, moist mucus membranes.  Trachea midline, no masses. Cardiovascular: No clubbing, cyanosis, or edema. Respiratory: Normal respiratory effort, no increased work of  breathing. Skin: No rashes, bruises or suspicious lesions. Neurologic: Grossly intact, no focal deficits, moving all 4 extremities. Psychiatric: Normal mood and affect.   Assessment & Plan:    - Nephrolithiasis Small 2 mm right renal calculus-stable.  It would be nonobstructing.  A renal ultrasound was ordered to evaluate for additional calculi and hydronephrosis.  She will be notified with results and further recommendations.   Abbie Sons, [place] [address] [place], [place] [address] [phone]

## 2019-04-28 ENCOUNTER — Telehealth: Payer: Self-pay | Admitting: Obstetrics and Gynecology

## 2019-04-28 ENCOUNTER — Other Ambulatory Visit: Payer: Self-pay | Admitting: Obstetrics and Gynecology

## 2019-04-28 DIAGNOSIS — Z1239 Encounter for other screening for malignant neoplasm of breast: Secondary | ICD-10-CM

## 2019-04-28 DIAGNOSIS — Q859 Phakomatosis, unspecified: Secondary | ICD-10-CM

## 2019-04-28 NOTE — Telephone Encounter (Signed)
Patient aware of appt scheduled for her mammogram on 06/01/19 at 2:20pm at [facility].

## 2019-05-10 DIAGNOSIS — M9903 Segmental and somatic dysfunction of lumbar region: Secondary | ICD-10-CM | POA: Diagnosis not present

## 2019-05-10 DIAGNOSIS — M9905 Segmental and somatic dysfunction of pelvic region: Secondary | ICD-10-CM | POA: Diagnosis not present

## 2019-05-10 DIAGNOSIS — M4306 Spondylolysis, lumbar region: Secondary | ICD-10-CM | POA: Diagnosis not present

## 2019-05-10 DIAGNOSIS — M5431 Sciatica, right side: Secondary | ICD-10-CM | POA: Diagnosis not present

## 2019-05-12 DIAGNOSIS — M4306 Spondylolysis, lumbar region: Secondary | ICD-10-CM | POA: Diagnosis not present

## 2019-05-12 DIAGNOSIS — M9903 Segmental and somatic dysfunction of lumbar region: Secondary | ICD-10-CM | POA: Diagnosis not present

## 2019-05-12 DIAGNOSIS — M5431 Sciatica, right side: Secondary | ICD-10-CM | POA: Diagnosis not present

## 2019-05-12 DIAGNOSIS — M9905 Segmental and somatic dysfunction of pelvic region: Secondary | ICD-10-CM | POA: Diagnosis not present

## 2019-05-15 DIAGNOSIS — M9905 Segmental and somatic dysfunction of pelvic region: Secondary | ICD-10-CM | POA: Diagnosis not present

## 2019-05-15 DIAGNOSIS — M9903 Segmental and somatic dysfunction of lumbar region: Secondary | ICD-10-CM | POA: Diagnosis not present

## 2019-05-15 DIAGNOSIS — M4306 Spondylolysis, lumbar region: Secondary | ICD-10-CM | POA: Diagnosis not present

## 2019-05-15 DIAGNOSIS — M5431 Sciatica, right side: Secondary | ICD-10-CM | POA: Diagnosis not present

## 2019-05-18 DIAGNOSIS — M5431 Sciatica, right side: Secondary | ICD-10-CM | POA: Diagnosis not present

## 2019-05-18 DIAGNOSIS — M9903 Segmental and somatic dysfunction of lumbar region: Secondary | ICD-10-CM | POA: Diagnosis not present

## 2019-05-18 DIAGNOSIS — M4306 Spondylolysis, lumbar region: Secondary | ICD-10-CM | POA: Diagnosis not present

## 2019-05-18 DIAGNOSIS — M9905 Segmental and somatic dysfunction of pelvic region: Secondary | ICD-10-CM | POA: Diagnosis not present

## 2019-05-24 DIAGNOSIS — M9903 Segmental and somatic dysfunction of lumbar region: Secondary | ICD-10-CM | POA: Diagnosis not present

## 2019-05-24 DIAGNOSIS — M4306 Spondylolysis, lumbar region: Secondary | ICD-10-CM | POA: Diagnosis not present

## 2019-05-24 DIAGNOSIS — M5431 Sciatica, right side: Secondary | ICD-10-CM | POA: Diagnosis not present

## 2019-05-24 DIAGNOSIS — M9905 Segmental and somatic dysfunction of pelvic region: Secondary | ICD-10-CM | POA: Diagnosis not present

## 2019-05-26 DIAGNOSIS — M9905 Segmental and somatic dysfunction of pelvic region: Secondary | ICD-10-CM | POA: Diagnosis not present

## 2019-05-26 DIAGNOSIS — M9903 Segmental and somatic dysfunction of lumbar region: Secondary | ICD-10-CM | POA: Diagnosis not present

## 2019-05-26 DIAGNOSIS — M4306 Spondylolysis, lumbar region: Secondary | ICD-10-CM | POA: Diagnosis not present

## 2019-05-26 DIAGNOSIS — M5431 Sciatica, right side: Secondary | ICD-10-CM | POA: Diagnosis not present

## 2019-05-30 DIAGNOSIS — M5431 Sciatica, right side: Secondary | ICD-10-CM | POA: Diagnosis not present

## 2019-05-30 DIAGNOSIS — M4306 Spondylolysis, lumbar region: Secondary | ICD-10-CM | POA: Diagnosis not present

## 2019-05-30 DIAGNOSIS — M9905 Segmental and somatic dysfunction of pelvic region: Secondary | ICD-10-CM | POA: Diagnosis not present

## 2019-05-30 DIAGNOSIS — M9903 Segmental and somatic dysfunction of lumbar region: Secondary | ICD-10-CM | POA: Diagnosis not present

## 2019-06-01 ENCOUNTER — Other Ambulatory Visit: Payer: Self-pay | Admitting: Obstetrics and Gynecology

## 2019-06-01 ENCOUNTER — Encounter: Payer: Self-pay | Admitting: Radiology

## 2019-06-01 ENCOUNTER — Ambulatory Visit
Admission: RE | Admit: 2019-06-01 | Discharge: 2019-06-01 | Disposition: A | Discharge status: Home / Self Care | Payer: 59 | Source: Ambulatory Visit | Attending: Obstetrics and Gynecology | Admitting: Obstetrics and Gynecology

## 2019-06-01 DIAGNOSIS — R921 Mammographic calcification found on diagnostic imaging of breast: Secondary | ICD-10-CM

## 2019-06-01 DIAGNOSIS — Q859 Phakomatosis, unspecified: Secondary | ICD-10-CM

## 2019-06-01 DIAGNOSIS — R928 Other abnormal and inconclusive findings on diagnostic imaging of breast: Secondary | ICD-10-CM

## 2019-06-01 DIAGNOSIS — Z1231 Encounter for screening mammogram for malignant neoplasm of breast: Secondary | ICD-10-CM | POA: Diagnosis not present

## 2019-06-01 DIAGNOSIS — Z1239 Encounter for other screening for malignant neoplasm of breast: Secondary | ICD-10-CM

## 2019-06-01 NOTE — Progress Notes (Signed)
FYI, your patient... not sure why it was sent to me.

## 2019-06-05 ENCOUNTER — Other Ambulatory Visit: Payer: Self-pay | Admitting: Obstetrics and Gynecology

## 2019-06-05 ENCOUNTER — Ambulatory Visit
Admission: RE | Admit: 2019-06-05 | Discharge: 2019-06-05 | Disposition: A | Discharge status: Home / Self Care | Payer: 59 | Source: Ambulatory Visit | Attending: Obstetrics and Gynecology | Admitting: Obstetrics and Gynecology

## 2019-06-05 DIAGNOSIS — R928 Other abnormal and inconclusive findings on diagnostic imaging of breast: Secondary | ICD-10-CM | POA: Insufficient documentation

## 2019-06-05 DIAGNOSIS — R921 Mammographic calcification found on diagnostic imaging of breast: Secondary | ICD-10-CM | POA: Insufficient documentation

## 2019-06-05 DIAGNOSIS — R920 Mammographic microcalcification found on diagnostic imaging of breast: Secondary | ICD-10-CM | POA: Diagnosis not present

## 2019-06-06 DIAGNOSIS — M9903 Segmental and somatic dysfunction of lumbar region: Secondary | ICD-10-CM | POA: Diagnosis not present

## 2019-06-06 DIAGNOSIS — M5431 Sciatica, right side: Secondary | ICD-10-CM | POA: Diagnosis not present

## 2019-06-06 DIAGNOSIS — M4306 Spondylolysis, lumbar region: Secondary | ICD-10-CM | POA: Diagnosis not present

## 2019-06-06 DIAGNOSIS — M9905 Segmental and somatic dysfunction of pelvic region: Secondary | ICD-10-CM | POA: Diagnosis not present

## 2019-06-26 DIAGNOSIS — M9903 Segmental and somatic dysfunction of lumbar region: Secondary | ICD-10-CM | POA: Diagnosis not present

## 2019-06-26 DIAGNOSIS — M5431 Sciatica, right side: Secondary | ICD-10-CM | POA: Diagnosis not present

## 2019-06-26 DIAGNOSIS — M9905 Segmental and somatic dysfunction of pelvic region: Secondary | ICD-10-CM | POA: Diagnosis not present

## 2019-06-26 DIAGNOSIS — M4306 Spondylolysis, lumbar region: Secondary | ICD-10-CM | POA: Diagnosis not present

## 2019-06-29 ENCOUNTER — Ambulatory Visit
Admission: RE | Admit: 2019-06-29 | Discharge: 2019-06-29 | Disposition: A | Discharge status: Home / Self Care | Payer: 59 | Source: Ambulatory Visit | Attending: Obstetrics and Gynecology | Admitting: Obstetrics and Gynecology

## 2019-06-29 DIAGNOSIS — R928 Other abnormal and inconclusive findings on diagnostic imaging of breast: Secondary | ICD-10-CM | POA: Insufficient documentation

## 2019-06-29 DIAGNOSIS — R921 Mammographic calcification found on diagnostic imaging of breast: Secondary | ICD-10-CM | POA: Diagnosis not present

## 2019-06-29 DIAGNOSIS — Z1239 Encounter for other screening for malignant neoplasm of breast: Secondary | ICD-10-CM | POA: Diagnosis not present

## 2019-06-29 HISTORY — PX: BREAST BIOPSY: SHX20

## 2019-06-30 DIAGNOSIS — S91332A Puncture wound without foreign body, left foot, initial encounter: Secondary | ICD-10-CM | POA: Diagnosis not present

## 2019-06-30 LAB — SURGICAL PATHOLOGY

## 2019-07-03 ENCOUNTER — Other Ambulatory Visit: Payer: Self-pay | Admitting: Obstetrics and Gynecology

## 2019-07-03 DIAGNOSIS — N6320 Unspecified lump in the left breast, unspecified quadrant: Secondary | ICD-10-CM

## 2019-07-03 DIAGNOSIS — R928 Other abnormal and inconclusive findings on diagnostic imaging of breast: Secondary | ICD-10-CM

## 2019-07-10 ENCOUNTER — Other Ambulatory Visit: Payer: Self-pay | Admitting: Obstetrics and Gynecology

## 2019-07-10 DIAGNOSIS — N6092 Unspecified benign mammary dysplasia of left breast: Secondary | ICD-10-CM

## 2019-07-10 DIAGNOSIS — N950 Postmenopausal bleeding: Secondary | ICD-10-CM

## 2019-07-10 NOTE — Telephone Encounter (Signed)
Needs ultrasound for postmenopausal bleeding sometime in the next week and follow up

## 2019-07-11 NOTE — Telephone Encounter (Signed)
Patient is schedule Wednesday, 07/19/19 at 1 pm for ultrasound and 2:30 for [place] to follow up

## 2019-07-14 ENCOUNTER — Ambulatory Visit
Admission: RE | Admit: 2019-07-14 | Discharge: 2019-07-14 | Disposition: A | Discharge status: Home / Self Care | Payer: 59 | Source: Ambulatory Visit | Attending: Obstetrics and Gynecology | Admitting: Obstetrics and Gynecology

## 2019-07-14 DIAGNOSIS — N6320 Unspecified lump in the left breast, unspecified quadrant: Secondary | ICD-10-CM

## 2019-07-14 DIAGNOSIS — R920 Mammographic microcalcification found on diagnostic imaging of breast: Secondary | ICD-10-CM | POA: Diagnosis not present

## 2019-07-14 DIAGNOSIS — R928 Other abnormal and inconclusive findings on diagnostic imaging of breast: Secondary | ICD-10-CM | POA: Insufficient documentation

## 2019-07-14 DIAGNOSIS — R921 Mammographic calcification found on diagnostic imaging of breast: Secondary | ICD-10-CM | POA: Diagnosis not present

## 2019-07-14 DIAGNOSIS — N6012 Diffuse cystic mastopathy of left breast: Secondary | ICD-10-CM | POA: Diagnosis not present

## 2019-07-14 HISTORY — PX: BREAST BIOPSY: SHX20

## 2019-07-18 LAB — SURGICAL PATHOLOGY

## 2019-07-19 ENCOUNTER — Other Ambulatory Visit: Payer: Self-pay

## 2019-07-19 ENCOUNTER — Ambulatory Visit (INDEPENDENT_AMBULATORY_CARE_PROVIDER_SITE_OTHER): Payer: 59

## 2019-07-19 ENCOUNTER — Encounter: Payer: Self-pay | Admitting: Obstetrics and Gynecology

## 2019-07-19 ENCOUNTER — Ambulatory Visit (INDEPENDENT_AMBULATORY_CARE_PROVIDER_SITE_OTHER): Payer: 59 | Admitting: Obstetrics and Gynecology

## 2019-07-19 VITALS — BP 139/87 | Wt 202.0 lb

## 2019-07-19 DIAGNOSIS — N83202 Unspecified ovarian cyst, left side: Secondary | ICD-10-CM | POA: Diagnosis not present

## 2019-07-19 DIAGNOSIS — N950 Postmenopausal bleeding: Secondary | ICD-10-CM

## 2019-07-19 NOTE — Progress Notes (Signed)
Gynecology Ultrasound Follow Up  Chief Complaint:  Chief Complaint  Patient presents with  . Follow-up    GYn Ultrasound     History of Present Illness: Patient is a 53 y.o. female who presents today for ultrasound evaluation of postmenopausal bleeding.  Ultrasound demonstrates the following findgins Adnexa: incidental finding of simple left ovarian cyst.  This was not present on prior TVUS imaging 01/04/2017 Uterus: Non-enlarged with endometrial stripe homogenous, well under 54mm Additional: no free fluid  The patient does report she has noted increased rectal pressure over the past 6 months.  She has also noted some discomfort with intercourse  Review of Systems: Review of Systems  Constitutional: Negative.   Gastrointestinal: Negative.   Genitourinary: Negative.     Past Medical History:  Past Medical History:  Diagnosis Date  . Allergy   . Anemia   . Bartholin cyst 1990  . Basal cell carcinoma   . Curvature of spine   . Family history of adverse reaction to anesthesia    Mother - PONV  . GERD (gastroesophageal reflux disease)   . High cholesterol   . History of bladder infections   . History of kidney infection   . History of kidney stones   . Hypertension   . Kidney stones   . Meningitis    Age 20  . PONV (postoperative nausea and vomiting)   . Renal disorder   . Seizures ([place])    none since 05/1997.  on medication  . Wears contact lenses     Past Surgical History:  Past Surgical History:  Procedure Laterality Date  . ANTERIOR AND POSTERIOR VAGINAL REPAIR     [facility]  . BLADDER SURGERY    . BREAST BIOPSY Left 06/29/2019   left breast stereo bx/ x clip/FOCAL ATYPICAL DUCT HYPERPLASIA WITH MICROCALCIFICATIONS.   Marland Kitchen BREAST BIOPSY Left 07/14/2019   left breast stereo bx/ coil clip/ path pending  . CHOLECYSTECTOMY  2011  . COMBINED HYSTEROSCOPY DIAGNOSTIC / D&C  12/06/2014   [facility]  . ENDOMETRIAL ABLATION  2011   [facility]  Dr. Amalia Hailey   . EXTRACORPOREAL SHOCK WAVE LITHOTRIPSY Right 08/25/2018   Procedure: EXTRACORPOREAL SHOCK WAVE LITHOTRIPSY (ESWL);  Surgeon: Billey Co, MD;  Location: [facility] ORS;  Service: Urology;  Laterality: Right;  . FOOT SURGERY Left   . GANGLION CYST EXCISION    . GASTRIC BYPASS  2011  . INCONTINENCE SURGERY    . RECTAL SURGERY    . SEPTOPLASTY    . TONSILLECTOMY      Gynecologic History:  Patient's last menstrual period was 11/29/2017 (approximate).  Family History:  Family History  Problem Relation Age of Onset  . Bladder Cancer Mother   . Prostate cancer Father   . Melanoma Father   . Breast cancer Cousin     Social History:  Social History   Socioeconomic History  . Marital status: Legally Separated    Spouse name: Not on file  . Number of children: Not on file  . Years of education: Not on file  . Highest education level: Not on file  Occupational History  . Not on file  Social Needs  . Financial resource strain: Not on file  . Food insecurity    Worry: Not on file    Inability: Not on file  . Transportation needs    Medical: Not on file    Non-medical: Not on file  Tobacco Use  . Smoking status: Never Smoker  .  Smokeless tobacco: Never Used  Substance and Sexual Activity  . Alcohol use: Yes    Alcohol/week: 0.0 standard drinks    Comment: social - 1 glass wine/month  . Drug use: No  . Sexual activity: Yes    Birth control/protection: Pill  Lifestyle  . Physical activity    Days per week: Not on file    Minutes per session: Not on file  . Stress: Not on file  Relationships  . Social Herbalist on phone: Not on file    Gets together: Not on file    Attends religious service: Not on file    Active member of club or organization: Not on file    Attends meetings of clubs or organizations: Not on file    Relationship status: Not on file  . Intimate partner violence    Fear of current or ex partner: Not on file    Emotionally abused: Not on  file    Physically abused: Not on file    Forced sexual activity: Not on file  Other Topics Concern  . Not on file  Social History Narrative  . Not on file    Allergies:  Allergies  Allergen Reactions  . Latex Rash    Dental appliances, balloons, gloves, condoms  . Penicillins Rash  . Sulfa Antibiotics Rash    Medications: Prior to Admission medications   Medication Sig Start Date End Date Taking? Authorizing Provider  Calcium-Vitamin D 600-200 MG-UNIT tablet Take by mouth. 10/12/11   [provider]  cetirizine (ZYRTEC) 10 MG tablet Take 10 mg by mouth daily.    [provider]  Cholecalciferol (VITAMIN D3) 5000 units TABS Frequency:QD   Dosage:0.0     Instructions:  Note:Dose: ? 10/12/11   [provider]  dexlansoprazole (DEXILANT) 60 MG capsule Take 60 mg by mouth daily.    [provider]  LamoTRIgine 200 MG TB24 24 hour tablet  05/30/18   [provider]  Multiple Vitamin (MULTIVITAMIN) tablet Take 1 tablet by mouth daily.    [provider]  nystatin (MYCOSTATIN/NYSTOP) powder Apply topically. 03/13/19 03/12/20  [provider]  triamterene-hydrochlorothiazide (MAXZIDE-25) 37.5-25 MG tablet Take 1 tablet by mouth daily.    [provider]  vitamin B-12 (CYANOCOBALAMIN) 1000 MCG tablet Frequency:QD   Dosage:0.0     Instructions:  Note:Dose: ? 10/12/11   [provider]  vitamin C (ASCORBIC ACID) 250 MG tablet Take 250 mg by mouth daily.    [provider]    Physical Exam Vitals: Blood pressure 139/87, weight 202 lb (91.6 kg), last menstrual period 11/29/2017.  General: NAD HEENT: normocephalic, anicteric Pulmonary: No increased work of breathing Extremities: no edema, erythema, or tenderness Neurologic: Grossly intact, normal gait Psychiatric: mood appropriate, affect full   Assessment: 53 y.o. G2P2 with postmenopausal bleeding and incidental left ovarian cyst  Plan: Problem List  Items Addressed This Visit    None    Visit Diagnoses    Left ovarian cyst    -  Primary   Relevant Orders   Ovarian Malignancy Risk-ROMA   Postmenopausal bleeding          1) PMB - endometrial stripe <20mm, no further evaluation required unless continued bleeding  2) Left ovarian cyst - most certainly benign.  However, given rectal pressure leaning toward surgery.  Will check ROMA.  3) A total of 15 minutes were spent in face-to-face contact with the patient during this encounter with over half  of that time devoted to counseling and coordination of care. - will schedule for laparoscopic left oophorectomy after ROMA resutls  4) Return if symptoms worsen or fail to improve.   Malachy Mood, MD, [facility], [facility] 07/19/2019, 3:08 PM

## 2019-07-20 LAB — OVARIAN MALIGNANCY RISK-ROMA
Cancer Antigen (CA) 125: 13.6 U/mL (ref 0.0–38.1)
HE4: 65.3 pmol/L (ref 0.0–105.2)
Postmenopausal ROMA: 1.38
Premenopausal ROMA: 1.31 — ABNORMAL HIGH

## 2019-07-20 LAB — POSTMENOPAUSAL INTERP: LOW

## 2019-07-20 LAB — PREMENOPAUSAL INTERP: HIGH

## 2019-07-21 ENCOUNTER — Telehealth: Payer: Self-pay | Admitting: Obstetrics and Gynecology

## 2019-07-21 NOTE — Telephone Encounter (Signed)
Patient is calling to follow up on scheduling surgery with Dr. Georgianne Fick. Please advise

## 2019-07-24 ENCOUNTER — Telehealth: Payer: Self-pay | Admitting: Obstetrics and Gynecology

## 2019-07-24 DIAGNOSIS — R921 Mammographic calcification found on diagnostic imaging of breast: Secondary | ICD-10-CM | POA: Diagnosis not present

## 2019-07-24 NOTE — Telephone Encounter (Signed)
Patient is aware of H&P at [facility] on 08/03/19 @ 8:10am, Pre-admit testing phone interview to be scheduled, COVID testing on 12/4 @ 8-10:30am at [facility], and OR on 08/08/19. Patient is aware to quarantine after COVID testing. Patient is aware she may receive calls from the [facility]. Patient confirmed UMR and no secondary insurance.

## 2019-07-24 NOTE — Telephone Encounter (Signed)
-----   Message from Malachy Mood, MD sent at 07/20/2019  7:29 PM EST ----- Regarding: Surgery Surgery Booking Request Patient Full Name:  Elizabeth Good  MRN: [id number]  DOB: 1965-09-25  Surgeon: Malachy Mood, MD  Requested Surgery Date and Time: 1-3 weeks Primary Diagnosis AND Code: Left ovarian cyst N83.202 Secondary Diagnosis and Code:  Surgical Procedure: laparoscopic left ovarian cystectomy L&D Notification: No Admission Status: same day surgery Length of Surgery: 1hr Special Case Needs: No H&P: Yes Phone Interview???:  Yes Interpreter: No Language:  Medical Clearance:  No Special Scheduling Instructions: Should be short case so I could do this at lunch or on a post call day (as long as not post call Monday) Any known health/anesthesia issues, diabetes, sleep apnea, latex allergy, defibrillator/pacemaker?: No Acuity: P3   (P1 highest, P2 delay may cause harm, P3 low, elective gyn, P4 lowest)

## 2019-08-01 DIAGNOSIS — N6489 Other specified disorders of breast: Secondary | ICD-10-CM | POA: Insufficient documentation

## 2019-08-03 ENCOUNTER — Encounter
Admission: RE | Admit: 2019-08-03 | Discharge: 2019-08-03 | Disposition: A | Discharge status: Home / Self Care | Payer: 59 | Source: Ambulatory Visit | Attending: Obstetrics and Gynecology | Admitting: Obstetrics and Gynecology

## 2019-08-03 ENCOUNTER — Ambulatory Visit (INDEPENDENT_AMBULATORY_CARE_PROVIDER_SITE_OTHER): Payer: 59 | Admitting: Obstetrics and Gynecology

## 2019-08-03 ENCOUNTER — Encounter: Payer: Self-pay | Admitting: Obstetrics and Gynecology

## 2019-08-03 ENCOUNTER — Other Ambulatory Visit: Payer: Self-pay

## 2019-08-03 VITALS — BP 135/81 | HR 91 | Wt 199.0 lb

## 2019-08-03 DIAGNOSIS — Z01818 Encounter for other preprocedural examination: Secondary | ICD-10-CM | POA: Diagnosis not present

## 2019-08-03 DIAGNOSIS — Z20828 Contact with and (suspected) exposure to other viral communicable diseases: Secondary | ICD-10-CM | POA: Diagnosis not present

## 2019-08-03 DIAGNOSIS — I10 Essential (primary) hypertension: Secondary | ICD-10-CM | POA: Diagnosis not present

## 2019-08-03 DIAGNOSIS — N83202 Unspecified ovarian cyst, left side: Secondary | ICD-10-CM

## 2019-08-03 HISTORY — DX: Ventricular premature depolarization: I49.3

## 2019-08-03 HISTORY — DX: Personal history of other diseases of the digestive system: Z87.19

## 2019-08-03 HISTORY — DX: Scoliosis, unspecified: M41.9

## 2019-08-03 HISTORY — DX: Iron deficiency: E61.1

## 2019-08-03 NOTE — Progress Notes (Signed)
Obstetrics & Gynecology Surgery H&P    Chief Complaint: Scheduled Surgery   History of Present Illness: Patient is a 53 y.o. G2P2 presenting for scheduled laparoscopic left oophorectomy, for the treatment or further evaluation of 6.6cm simple anechoic left ovarian cyst.   Prior Treatments prior to proceeding with surgery include: ultrasound evaluation and negative ROMA   Preoperative Pap: 11/26/2018 Results: NIL HPV negative  Preoperative Endometrial biopsy: N/A Preoperative Ultrasound:07/19/2019 normal uterus and endometrial stripe less than 36mm, but incidental 6.6cm simple left ovarian cyst   Review of Systems:10 point review of systems  Past Medical History:  Past Medical History:  Diagnosis Date   Allergy    Anemia    Bartholin cyst 1990   Basal cell carcinoma    Curvature of spine    Family history of adverse reaction to anesthesia    Mother - PONV   GERD (gastroesophageal reflux disease)    High cholesterol    History of bladder infections    History of kidney infection    History of kidney stones    Hypertension    Kidney stones    Meningitis    Age 76   PONV (postoperative nausea and vomiting)    Renal disorder    Seizures ([place])    none since 05/1997.  on medication   Wears contact lenses     Past Surgical History:  Past Surgical History:  Procedure Laterality Date   ANTERIOR AND POSTERIOR VAGINAL REPAIR     [place]     BREAST BIOPSY Left 06/29/2019   left breast stereo bx/ x clip/FOCAL ATYPICAL DUCT HYPERPLASIA WITH MICROCALCIFICATIONS.    BREAST BIOPSY Left 07/14/2019   left breast stereo bx/ coil clip/ path pending   CHOLECYSTECTOMY  2011   COMBINED HYSTEROSCOPY DIAGNOSTIC / D&C  12/06/2014   [facility]   ENDOMETRIAL ABLATION  2011   [facility]  Dr. Amalia Hailey   EXTRACORPOREAL SHOCK WAVE LITHOTRIPSY Right 08/25/2018   Procedure: EXTRACORPOREAL SHOCK WAVE LITHOTRIPSY (ESWL);  Surgeon: Billey Co, MD;  Location: [facility] ORS;  Service: Urology;  Laterality: Right;   FOOT SURGERY Left    GANGLION CYST EXCISION     GASTRIC BYPASS  2011   INCONTINENCE SURGERY     RECTAL SURGERY     SEPTOPLASTY     TONSILLECTOMY      Family History:  Family History  Problem Relation Age of Onset   Bladder Cancer Mother    Prostate cancer Father    Melanoma Father    Breast cancer Cousin     Social History:  Social History   Socioeconomic History   Marital status: Legally Separated    Spouse name: Not on file   Number of children: Not on file   Years of education: Not on file   Highest education level: Not on file  Occupational History   Not on file  Social Needs   Financial resource strain: Not on file   Food insecurity    Worry: Not on file    Inability: Not on file   Transportation needs    Medical: Not on file    Non-medical: Not on file  Tobacco Use   Smoking status: Never Smoker   Smokeless tobacco: Never Used  Substance and Sexual Activity   Alcohol use: Yes    Alcohol/week: 0.0 standard drinks    Comment: social - 1 glass wine/month   Drug use: No   Sexual activity: Yes  Birth control/protection: Pill  Lifestyle   Physical activity    Days per week: Not on file    Minutes per session: Not on file   Stress: Not on file  Relationships   Social connections    Talks on phone: Not on file    Gets together: Not on file    Attends religious service: Not on file    Active member of club or organization: Not on file    Attends meetings of clubs or organizations: Not on file    Relationship status: Not on file   Intimate partner violence    Fear of current or ex partner: Not on file    Emotionally abused: Not on file    Physically abused: Not on file    Forced sexual activity: Not on file  Other Topics Concern   Not on file  Social History Narrative   Not on file    Allergies:  Allergies  Allergen Reactions   Latex Rash      Dental appliances, balloons, gloves, condoms   Penicillins Rash    Did it involve swelling of the face/tongue/throat, SOB, or low BP? No Did it involve sudden or severe rash/hives, skin peeling, or any reaction on the inside of your mouth or nose? No Did you need to seek medical attention at a hospital or doctor's office? No When did it last happen? If all above answers are NO, may proceed with cephalosporin use.    Sulfa Antibiotics Rash    Medications: Prior to Admission medications   Medication Sig Start Date End Date Taking? Authorizing Provider  cetirizine (ZYRTEC) 10 MG tablet Take 10 mg by mouth daily.    [provider]  Cholecalciferol (VITAMIN D3) 5000 units TABS Take 5,000 Units by mouth every 7 (seven) days.  10/12/11   [provider]  dexlansoprazole (DEXILANT) 60 MG capsule Take 60 mg by mouth daily.    [provider]  LamoTRIgine 200 MG TB24 24 hour tablet Take 200 mg by mouth 2 (two) times daily.     [provider]  Multiple Vitamin (MULTIVITAMIN) tablet Take 1 tablet by mouth daily.    [provider]  nystatin (MYCOSTATIN/NYSTOP) powder Apply 1 Bottle topically daily as needed (irritation).  03/13/19 03/12/20  [provider]  triamterene-hydrochlorothiazide (MAXZIDE-25) 37.5-25 MG tablet Take 1 tablet by mouth daily.    [provider]    Physical Exam Vitals: Blood pressure 135/81, pulse 91, weight 199 lb (90.3 kg), last menstrual period 11/29/2017. General: NAD HEENT: normocephalic, anicteric Pulmonary: No increased work of breathing, CTAB Cardiovascular: RRR, distal pulses 2+ Abdomen: soft, non-tender, non-distended Genitourinary: deferred Extremities: no edema, erythema, or tenderness Neurologic: Grossly intact Psychiatric: mood appropriate, affect full  Imaging US Transvaginal Non-ob  Result Date: 07/19/2019 Patient Name: Elizabeth Good DOB: 11/13/1965 MRN: [id number]  ULTRASOUND REPORT Location: [facility] Date of Service: 07/19/2019 Indications:Abnormal Uterine Bleeding Findings: The uterus is anteverted and measures 7.9 x 6.5 x 4.9 cm. Echo texture is heterogenous without evidence of focal masses. The Endometrium measures 1.5 mm. Right Ovary measures 2.6 x 1.6 x 1.4 cm. It is normal in appearance. There may be some microcalcifications  in the right ovary. Left Ovary measures 6.6 x 6.1 x 4.9 cm. It is not normal in appearance. There is a cyst with anechoic fluid and possibly some microcalcifications in the dependent portion of the cyst vs. Artifact. There is not blood flow seen within the cyst. The cyst measures 65.9 x  46.2 x 52.7 mm. Survey of the adnexa demonstrates no adnexal masses. There is no free fluid in the cul de sac. Impression: 1. Heterogeneous uterus. 2. Thin endometrium. 3. Normal right ovary with possible microcalcifications. 4. There is a 6.6 cm cyst with anechoic fluid in the left ovary. Recommendations: 1.Clinical correlation with the patient's History and Physical Exam. Gweneth Dimitri, RT Images reviewed.  Endometrial stripe is well under 29mm.  There is an incidental finding of a 65.9 x 46.2 x 52.7 mm left ovarian simple cyst. No increased doppler blood flow, no solid component noted.  Based on Society of Radiologist in Ultrasound Consensus Statement follow up imaging is recommended.  1. Cysts ?1 cm: Are clinically inconsequential; at the discretion of the interpreting physician whether or not to describe them in the imaging report; do not need follow-up. 2. Cysts >1 and ?7 cm: Should be described in the imaging report with statement that they are almost certainly benign; yearly follow-up, at least initially, with [place] recommended. Some practices may opt to increase the lower size threshold for follow-up from 1 cm to as high as 3 cm. One may opt to continue follow-up annually or to decrease the frequency of follow-up once stability or decrease in size  has been confirmed. Cysts in the larger end of this range should still generally be followed on a regular basis. 3. Cysts >7 cm: Since these may be difficult to assess completely with [place], further imaging with MR or surgical evaluation should be considered. Gordy Levan et al. Management of Asymptomatic Ovarian and Other Adnexal Cysts Imaged at [place]: Society of Radiologists in [place] Statement 2010. Radiology 256 (Sept 2010): [id number].). Malachy Mood, MD, [facility], [facility] 07/19/2019, 2:40 PM   Mm Clip Placement Left  Result Date: 07/14/2019 CLINICAL DATA:  Status post stereotactic core needle biopsy calcifications left breast. EXAM: DIAGNOSTIC LEFT MAMMOGRAM POST STEREOTACTIC BIOPSY COMPARISON:  Previous exam(s). FINDINGS: Mammographic images were obtained following stereotactic guided biopsy of a group of calcifications lateral left breast. The biopsy marking clip is in expected position at the site of biopsy. The X shaped biopsy clip, which marks the calcifications biopsied on 06/29/2019, lies superior and slightly lateral to the coil shaped biopsy clip placed after today's procedure. IMPRESSION: Appropriate positioning of the coil shaped biopsy marking clip at the site of biopsy in the lateral left breast. Final Assessment: Post Procedure Mammograms for Marker Placement Electronically Signed   By: Lajean Manes M.D.   On: 07/14/2019 13:47   Mm Lt Breast Bx W Loc Dev 1st Lesion Image Bx Spec Stereo Guide  Addendum Date: 07/18/2019   ADDENDUM REPORT: 07/18/2019 15:23 ADDENDUM: PATHOLOGY revealed: A. BREAST, LEFT, UPPER-OUTER QUADRANT; STEREOTACTIC-GUIDED CORE BIOPSY: -CALCIFICATIONS ASSOCIATED WITH APOCRINE METAPLASIA. - NEGATIVE FOR ATYPIA AND MALIGNANCY. Comment: Additional deeper sections were reviewed for correlation with the specimen radiograph. The slides from the previous left breast biopsy [id number]) were reviewed. Pathology results are  CONCORDANT with imaging findings, per Dr. Lajean Manes. Pathology results were discussed with patient via telephone. The patient reported doing well after the biopsy with no adverse symptoms, only tenderness at the site. Post biopsy care instructions were reviewed and questions were answered. The patient was encouraged to call [facility] for any additional questions or concerns. Recommendation: Follow current treatment plan with [facility] surgeon for previous high risk lesion biopsied on 06/29/2019. Addendum by Electa Sniff RN on 07/18/2019. Electronically Signed   By: Dedra Skeens.D.  On: 07/18/2019 15:23   Result Date: 07/18/2019 CLINICAL DATA:  Patient presents for stereotactic core needle biopsy of calcifications in the left breast. Patient underwent stereotactic core needle biopsy of a group of calcifications in the left breast on 06/29/2019, with pathology revealing atypical ductal hyperplasia. Patient is scheduled for surgical excision, but prior to this, 1 of the additional groups of calcifications in the upper outer left breast lower recommended for biopsy. Patient presents today for this procedure. EXAM: LEFT BREAST STEREOTACTIC CORE NEEDLE BIOPSY COMPARISON:  Previous exams. FINDINGS: The patient and I discussed the procedure of stereotactic-guided biopsy including benefits and alternatives. We discussed the high likelihood of a successful procedure. We discussed the risks of the procedure including infection, bleeding, tissue injury, clip migration, and inadequate sampling. Informed written consent was given. The usual time out protocol was performed immediately prior to the procedure. Using sterile technique and 1% Lidocaine as local anesthetic, under stereotactic guidance, a 9 gauge vacuum assisted device was used to perform core needle biopsy of calcifications in the upper outer quadrant of the left breast using a lateral approach. Specimen radiograph was performed showing  multiple calcifications for which biopsy was performed. Specimens with calcifications are identified for pathology. Lesion quadrant: Upper outer quadrant At the conclusion of the procedure, coil shaped tissue marker clip was deployed into the biopsy cavity. Follow-up 2-view mammogram was performed and dictated separately. IMPRESSION: Stereotactic-guided biopsy of left breast calcifications. No apparent complications. Electronically Signed: By: Lajean Manes M.D. On: 07/14/2019 13:37    Assessment: 53 y.o. G2P2 presenting for scheduled laparoscopic left oophorectomy  Plan: 1) I have had a careful discussion with this patient about all the options available and the risk/benefits of each. I have fully informed this patient that a laparoscopy may subject her to a variety of discomforts and risks: She understands that most patients have surgery with little difficulty, but problems can happen ranging from minor to fatal. These include nausea, vomiting, pain, bleeding, infection, poor healing, hernia, or formation of adhesions. Unexpected reactions may occur from any drug or anesthetic given. Unintended injury may occur to other pelvic or abdominal structures such as Fallopian tubes, ovaries, bladder, ureter (tube from kidney to bladder), or bowel. Nerves going from the pelvis to the legs may be injured. Any such injury may require immediate or later additional surgery to correct the problem. Excessive blood loss requiring transfusion is very unlikely but possible. Dangerous blood clots may form in the legs or lungs. Physical and sexual activity will be restricted in varying degrees for an indeterminate period of time but most often 2-4 weeks. She understands that the plan is to do this laparoscopically, however, there is a chance that this will need to be performed via a larger incision. Finally, she understands that it is impossible to list every possible undesirable effect and that the condition for which surgery  is done is not always cured or significantly improved, and in rare cases may be even worsen. Ample time was given to answer all questions.   2) Routine postoperative instructions were reviewed with the patient and her family in detail today including the expected length of recovery and likely postoperative course.  The patient concurred with the proposed plan, giving informed written consent for the surgery today.  Patient instructed on the importance of being NPO after midnight prior to her procedure.  If warranted preoperative prophylactic antibiotics and SCDs ordered on call to the OR to meet SCIP guidelines and adhere to recommendation laid forth  in [place] Number 104 May 2009  "Antibiotic Prophylaxis for Gynecologic Procedures".     Malachy Mood, MD, [facility], [facility] 08/03/2019, 8:28 AM

## 2019-08-03 NOTE — Patient Instructions (Signed)
Your procedure is scheduled on: 08-08-19 TUESDAY Report to Same Day Surgery 2nd floor medical mall [facility] Entrance-take elevator on left to 2nd floor.  Check in with surgery information desk.) To find out your arrival time please call [phone] between 1PM - 3PM on 08-07-19 MONDAY  Remember: Instructions that are not followed completely may result in serious medical risk, up to and including death, or upon the discretion of your surgeon and anesthesiologist your surgery may need to be rescheduled.    _x___ 1. Do not eat food after midnight the night before your procedure. NO GUM OR CANDY AFTER MIDNIGHT. You may drink clear liquids up to 2 hours before you are scheduled to arrive at the hospital for your procedure.  Do not drink clear liquids within 2 hours of your scheduled arrival to the hospital.  Clear liquids include  --Water or Apple juice without pulp  --Gatorade  --Black Coffee or Clear Tea (No milk, no creamers, do not add anything to the coffee or Tea   ____Ensure clear carbohydrate drink on the way to the hospital for bariatric patients  _x___GATORADE G2 drink 3 hours PRIOR TO ARRIVAL TIME TO HOSPITAL     __x__ 2. No Alcohol for 24 hours before or after surgery.   __x__3. No Smoking or e-cigarettes for 24 prior to surgery.  Do not use any chewable tobacco products for at least 6 hour prior to surgery   ____  4. Bring all medications with you on the day of surgery if instructed.    __x__ 5. Notify your doctor if there is any change in your medical condition     (cold, fever, infections).    x___6. On the morning of surgery brush your teeth with toothpaste and water.  You may rinse your mouth with mouth wash if you wish.  Do not swallow any toothpaste or mouthwash.   Do not wear jewelry, make-up, hairpins, clips or nail polish.  Do not wear lotions, powders, or perfumes.   Do not shave 48 hours prior to surgery. Men may shave face and neck.  Do not bring valuables  to the hospital.    [facility] is not responsible for any belongings or valuables.               Contacts, dentures or bridgework may not be worn into surgery.  Leave your suitcase in the car. After surgery it may be brought to your room.  For patients admitted to the hospital, discharge time is determined by your treatment team.  _  Patients discharged the day of surgery will not be allowed to drive home.  You will need someone to drive you home and stay with you the night of your procedure.    Please read over the following fact sheets that you were given:   [facility] Preparing for Surgery and or MRSA Information   _x___ TAKE THE FOLLOWING MEDICATION THE MORNING OF SURGERY WITH A SMALL SIP OF WATER. These include:  1. LAMOTRIGINE   2. DEXILANT  3.   4.  5.  6.  ____Fleets enema or Magnesium Citrate as directed.   _x___ Use CHG Soap or sage wipes as directed on instruction sheet   ____ Use inhalers on the day of surgery and bring to hospital day of surgery  ____ Stop Metformin and Janumet 2 days prior to surgery.    ____ Take 1/2 of usual insulin dose the night before surgery and none on the morning surgery.  ____ Follow recommendations from Cardiologist, Pulmonologist or PCP regarding stopping Aspirin, Coumadin, Plavix ,Eliquis, Effient, or Pradaxa, and Pletal.  X____Stop Anti-inflammatories such as Advil, Aleve, Ibuprofen, Motrin, Naproxen, Naprosyn, Goodies powders or aspirin products NOW-OK to take Tylenol   ____ Stop supplements until after surgery.     ____ Bring C-Pap to the hospital.

## 2019-08-04 ENCOUNTER — Other Ambulatory Visit: Admission: RE | Admit: 2019-08-04 | Payer: 59 | Source: Ambulatory Visit

## 2019-08-04 ENCOUNTER — Encounter
Admission: RE | Admit: 2019-08-04 | Discharge: 2019-08-04 | Disposition: A | Discharge status: Home / Self Care | Payer: 59 | Source: Ambulatory Visit | Attending: Obstetrics and Gynecology | Admitting: Obstetrics and Gynecology

## 2019-08-04 DIAGNOSIS — Z01810 Encounter for preprocedural cardiovascular examination: Secondary | ICD-10-CM | POA: Diagnosis not present

## 2019-08-04 DIAGNOSIS — Z01818 Encounter for other preprocedural examination: Secondary | ICD-10-CM | POA: Diagnosis not present

## 2019-08-04 DIAGNOSIS — Z20828 Contact with and (suspected) exposure to other viral communicable diseases: Secondary | ICD-10-CM | POA: Diagnosis not present

## 2019-08-04 DIAGNOSIS — I10 Essential (primary) hypertension: Secondary | ICD-10-CM | POA: Diagnosis not present

## 2019-08-04 LAB — SARS CORONAVIRUS 2 (TAT 6-24 HRS): SARS Coronavirus 2: NEGATIVE

## 2019-08-04 LAB — POTASSIUM: Potassium: 3.8 mmol/L (ref 3.5–5.1)

## 2019-08-08 ENCOUNTER — Ambulatory Visit: Payer: 59 | Admitting: Certified Registered Nurse Anesthetist

## 2019-08-08 ENCOUNTER — Other Ambulatory Visit: Payer: Self-pay

## 2019-08-08 ENCOUNTER — Encounter
Admission: RE | Disposition: A | Discharge status: Home / Self Care | Payer: Self-pay | Source: Home / Self Care | Attending: Obstetrics and Gynecology

## 2019-08-08 ENCOUNTER — Ambulatory Visit
Admission: RE | Admit: 2019-08-08 | Discharge: 2019-08-08 | Disposition: A | Discharge status: Home / Self Care | Payer: 59 | Attending: Obstetrics and Gynecology | Admitting: Obstetrics and Gynecology

## 2019-08-08 ENCOUNTER — Encounter: Payer: Self-pay | Admitting: Certified Registered Nurse Anesthetist

## 2019-08-08 DIAGNOSIS — Z79899 Other long term (current) drug therapy: Secondary | ICD-10-CM | POA: Diagnosis not present

## 2019-08-08 DIAGNOSIS — E7800 Pure hypercholesterolemia, unspecified: Secondary | ICD-10-CM | POA: Diagnosis not present

## 2019-08-08 DIAGNOSIS — Z91040 Latex allergy status: Secondary | ICD-10-CM | POA: Diagnosis not present

## 2019-08-08 DIAGNOSIS — Z881 Allergy status to other antibiotic agents status: Secondary | ICD-10-CM | POA: Diagnosis not present

## 2019-08-08 DIAGNOSIS — Z9884 Bariatric surgery status: Secondary | ICD-10-CM | POA: Diagnosis not present

## 2019-08-08 DIAGNOSIS — Z9889 Other specified postprocedural states: Secondary | ICD-10-CM

## 2019-08-08 DIAGNOSIS — R569 Unspecified convulsions: Secondary | ICD-10-CM | POA: Diagnosis not present

## 2019-08-08 DIAGNOSIS — N6092 Unspecified benign mammary dysplasia of left breast: Secondary | ICD-10-CM | POA: Diagnosis not present

## 2019-08-08 DIAGNOSIS — Z882 Allergy status to sulfonamides status: Secondary | ICD-10-CM | POA: Diagnosis not present

## 2019-08-08 DIAGNOSIS — D649 Anemia, unspecified: Secondary | ICD-10-CM | POA: Diagnosis not present

## 2019-08-08 DIAGNOSIS — N83292 Other ovarian cyst, left side: Secondary | ICD-10-CM | POA: Diagnosis not present

## 2019-08-08 DIAGNOSIS — N802 Endometriosis of fallopian tube: Secondary | ICD-10-CM | POA: Insufficient documentation

## 2019-08-08 DIAGNOSIS — I10 Essential (primary) hypertension: Secondary | ICD-10-CM | POA: Diagnosis not present

## 2019-08-08 DIAGNOSIS — Z880 Allergy status to penicillin: Secondary | ICD-10-CM | POA: Insufficient documentation

## 2019-08-08 DIAGNOSIS — N6489 Other specified disorders of breast: Secondary | ICD-10-CM | POA: Diagnosis not present

## 2019-08-08 DIAGNOSIS — N83202 Unspecified ovarian cyst, left side: Secondary | ICD-10-CM | POA: Diagnosis not present

## 2019-08-08 DIAGNOSIS — N838 Other noninflammatory disorders of ovary, fallopian tube and broad ligament: Secondary | ICD-10-CM | POA: Diagnosis not present

## 2019-08-08 DIAGNOSIS — K219 Gastro-esophageal reflux disease without esophagitis: Secondary | ICD-10-CM | POA: Diagnosis not present

## 2019-08-08 HISTORY — PX: LAPAROSCOPIC BILATERAL SALPINGECTOMY: SHX5889

## 2019-08-08 HISTORY — PX: BIOPSY: SHX5522

## 2019-08-08 LAB — TYPE AND SCREEN
ABO/RH(D): O POS
Antibody Screen: NEGATIVE

## 2019-08-08 LAB — ABO/RH: ABO/RH(D): O POS

## 2019-08-08 SURGERY — SALPINGECTOMY, BILATERAL, LAPAROSCOPIC
Anesthesia: General

## 2019-08-08 MED ORDER — PROPOFOL 10 MG/ML IV BOLUS
INTRAVENOUS | Status: DC | PRN
  Administered 2019-08-08: 160 mg via INTRAVENOUS

## 2019-08-08 MED ORDER — FENTANYL CITRATE (PF) 250 MCG/5ML IJ SOLN
INTRAMUSCULAR | Status: AC
  Filled 2019-08-08: qty 5

## 2019-08-08 MED ORDER — PROPOFOL 500 MG/50ML IV EMUL
INTRAVENOUS | Status: AC
  Filled 2019-08-08: qty 50

## 2019-08-08 MED ORDER — ROCURONIUM BROMIDE 50 MG/5ML IV SOLN
INTRAVENOUS | Status: AC
  Filled 2019-08-08: qty 1

## 2019-08-08 MED ORDER — LACTATED RINGERS IV SOLN
INTRAVENOUS | Status: DC
  Administered 2019-08-08: 11:00:00 50 mL/h via INTRAVENOUS

## 2019-08-08 MED ORDER — FENTANYL CITRATE (PF) 100 MCG/2ML IJ SOLN
INTRAMUSCULAR | Status: AC
  Filled 2019-08-08: qty 2

## 2019-08-08 MED ORDER — ROCURONIUM BROMIDE 100 MG/10ML IV SOLN
INTRAVENOUS | Status: DC | PRN
  Administered 2019-08-08: 30 mg via INTRAVENOUS
  Administered 2019-08-08: 50 mg via INTRAVENOUS

## 2019-08-08 MED ORDER — MIDAZOLAM HCL 2 MG/2ML IJ SOLN
INTRAMUSCULAR | Status: AC
  Filled 2019-08-08: qty 2

## 2019-08-08 MED ORDER — FENTANYL CITRATE (PF) 100 MCG/2ML IJ SOLN
25.0000 ug | INTRAMUSCULAR | Status: DC | PRN
  Administered 2019-08-08 (×4): 25 ug via INTRAVENOUS

## 2019-08-08 MED ORDER — ONDANSETRON HCL 4 MG/2ML IJ SOLN
INTRAMUSCULAR | Status: AC
  Filled 2019-08-08: qty 2

## 2019-08-08 MED ORDER — LIDOCAINE HCL 4 % MT SOLN
OROMUCOSAL | Status: DC | PRN
  Administered 2019-08-08: 4 mL via TOPICAL

## 2019-08-08 MED ORDER — PROPOFOL 10 MG/ML IV BOLUS
INTRAVENOUS | Status: AC
  Filled 2019-08-08: qty 20

## 2019-08-08 MED ORDER — PROMETHAZINE HCL 12.5 MG PO TABS: 12.5000 mg | ORAL_TABLET | Freq: Four times a day (QID) | ORAL | 0 refills | Status: DC | PRN

## 2019-08-08 MED ORDER — OXYCODONE-ACETAMINOPHEN 5-325 MG PO TABS
ORAL_TABLET | ORAL | Status: AC
  Filled 2019-08-08: qty 1

## 2019-08-08 MED ORDER — LIDOCAINE HCL (PF) 2 % IJ SOLN
INTRAMUSCULAR | Status: AC
  Filled 2019-08-08: qty 10

## 2019-08-08 MED ORDER — IBUPROFEN 600 MG PO TABS: 600.0000 mg | ORAL_TABLET | Freq: Four times a day (QID) | ORAL | 3 refills | Status: DC | PRN

## 2019-08-08 MED ORDER — MIDAZOLAM HCL 2 MG/2ML IJ SOLN
INTRAMUSCULAR | Status: DC | PRN
  Administered 2019-08-08: 2 mg via INTRAVENOUS

## 2019-08-08 MED ORDER — FENTANYL CITRATE (PF) 250 MCG/5ML IJ SOLN
INTRAMUSCULAR | Status: DC | PRN
  Administered 2019-08-08 (×4): 50 ug via INTRAVENOUS

## 2019-08-08 MED ORDER — OXYCODONE-ACETAMINOPHEN 5-325 MG PO TABS
1.0000 | ORAL_TABLET | ORAL | Status: DC | PRN
  Administered 2019-08-08: 1 via ORAL

## 2019-08-08 MED ORDER — SUGAMMADEX SODIUM 200 MG/2ML IV SOLN
INTRAVENOUS | Status: DC | PRN
  Administered 2019-08-08: 200 mg via INTRAVENOUS

## 2019-08-08 MED ORDER — ACETAMINOPHEN 10 MG/ML IV SOLN
INTRAVENOUS | Status: DC | PRN
  Administered 2019-08-08: 1000 mg via INTRAVENOUS

## 2019-08-08 MED ORDER — BUPIVACAINE HCL (PF) 0.5 % IJ SOLN
INTRAMUSCULAR | Status: AC
  Filled 2019-08-08: qty 30

## 2019-08-08 MED ORDER — SUGAMMADEX SODIUM 200 MG/2ML IV SOLN
INTRAVENOUS | Status: AC
  Filled 2019-08-08: qty 2

## 2019-08-08 MED ORDER — PROPOFOL 500 MG/50ML IV EMUL
INTRAVENOUS | Status: DC | PRN
  Administered 2019-08-08: 130 ug/kg/min via INTRAVENOUS

## 2019-08-08 MED ORDER — LIDOCAINE HCL (CARDIAC) PF 100 MG/5ML IV SOSY
PREFILLED_SYRINGE | INTRAVENOUS | Status: DC | PRN
  Administered 2019-08-08: 100 mg via INTRATRACHEAL

## 2019-08-08 MED ORDER — DEXAMETHASONE SODIUM PHOSPHATE 10 MG/ML IJ SOLN
INTRAMUSCULAR | Status: DC | PRN
  Administered 2019-08-08: 10 mg via INTRAVENOUS

## 2019-08-08 MED ORDER — PALONOSETRON HCL INJECTION 0.25 MG/5ML
0.2500 mg | Freq: Once | INTRAVENOUS | Status: AC
  Administered 2019-08-08: 0.25 mg via INTRAVENOUS
  Filled 2019-08-08: qty 5

## 2019-08-08 MED ORDER — BUPIVACAINE HCL 0.5 % IJ SOLN
INTRAMUSCULAR | Status: DC | PRN
  Administered 2019-08-08: 14 mL

## 2019-08-08 MED ORDER — ACETAMINOPHEN NICU IV SYRINGE 10 MG/ML
INTRAVENOUS | Status: AC
  Filled 2019-08-08: qty 1

## 2019-08-08 MED ORDER — OXYCODONE-ACETAMINOPHEN 5-325 MG PO TABS: 1.0000 | ORAL_TABLET | ORAL | 0 refills | Status: DC | PRN

## 2019-08-08 SURGICAL SUPPLY — 37 items
ANCHOR TIS RET SYS 235ML (MISCELLANEOUS) IMPLANT
BAG URINE DRAIN 2000ML AR STRL (UROLOGICAL SUPPLIES) ×4 IMPLANT
BLADE SURG SZ11 CARB STEEL (BLADE) ×4 IMPLANT
CANISTER SUCT 1200ML W/VALVE (MISCELLANEOUS) ×4 IMPLANT
CATH FOLEY 2WAY  5CC 16FR (CATHETERS) ×1
CATH URTH 16FR FL 2W BLN LF (CATHETERS) ×3 IMPLANT
CHLORAPREP W/TINT 26 (MISCELLANEOUS) ×4 IMPLANT
COVER WAND RF STERILE (DRAPES) ×4 IMPLANT
DERMABOND ADVANCED (GAUZE/BANDAGES/DRESSINGS) ×1
DERMABOND ADVANCED .7 DNX12 (GAUZE/BANDAGES/DRESSINGS) ×3 IMPLANT
GLOVE BIO SURGEON STRL SZ7 (GLOVE) ×4 IMPLANT
GLOVE INDICATOR 7.5 STRL GRN (GLOVE) ×4 IMPLANT
GOWN STRL REUS W/ TWL LRG LVL3 (GOWN DISPOSABLE) ×6 IMPLANT
GOWN STRL REUS W/ TWL XL LVL3 (GOWN DISPOSABLE) IMPLANT
GOWN STRL REUS W/TWL LRG LVL3 (GOWN DISPOSABLE) ×2
GOWN STRL REUS W/TWL XL LVL3 (GOWN DISPOSABLE)
GRASPER SUT TROCAR 14GX15 (MISCELLANEOUS) ×4 IMPLANT
IRRIGATION STRYKERFLOW (MISCELLANEOUS) IMPLANT
IRRIGATOR STRYKERFLOW (MISCELLANEOUS)
IV LACTATED RINGERS 1000ML (IV SOLUTION) ×4 IMPLANT
KIT PINK PAD W/HEAD ARE REST (MISCELLANEOUS) ×4
KIT PINK PAD W/HEAD ARM REST (MISCELLANEOUS) ×3 IMPLANT
KIT TURNOVER CYSTO (KITS) ×4 IMPLANT
LABEL OR SOLS (LABEL) ×4 IMPLANT
NS IRRIG 500ML POUR BTL (IV SOLUTION) ×4 IMPLANT
PACK GYN LAPAROSCOPIC (MISCELLANEOUS) ×4 IMPLANT
PAD OB MATERNITY 4.3X12.25 (PERSONAL CARE ITEMS) ×4 IMPLANT
PAD PREP 24X41 OB/GYN DISP (PERSONAL CARE ITEMS) ×4 IMPLANT
SCISSORS METZENBAUM CVD 33 (INSTRUMENTS) IMPLANT
SET TUBE SMOKE EVAC HIGH FLOW (TUBING) ×4 IMPLANT
SHEARS HARMONIC ACE PLUS 36CM (ENDOMECHANICALS) ×4 IMPLANT
SLEEVE ENDOPATH XCEL 5M (ENDOMECHANICALS) ×4 IMPLANT
SUT MNCRL AB 4-0 PS2 18 (SUTURE) ×4 IMPLANT
SUT VIC AB 2-0 UR6 27 (SUTURE) ×4 IMPLANT
SYR 50ML LL SCALE MARK (SYRINGE) ×4 IMPLANT
TROCAR ENDO BLADELESS 11MM (ENDOMECHANICALS) ×4 IMPLANT
TROCAR XCEL NON-BLD 5MMX100MML (ENDOMECHANICALS) ×4 IMPLANT

## 2019-08-08 NOTE — Op Note (Signed)
Preoperative Diagnosis: 1) 52 y.o.  6cm left ovarian cyst  Postoperative Diagnosis: 1) 53 y.o. ruptured left ovarian cyst  Operation Performed: Laparoscopic left oophorectomy, bilateral salpingectomy, and uterine serosal biopsies  Indication: 53 y.o. G2P2  with left ovarian cyst  Surgeon: Malachy Mood, MD  Anesthesia: General  Preoperative Antibiotics: none  Estimated Blood Loss: 5 mL  Drains or Tubes: none  Implants: none  Specimens Removed: none  Complications: none  Intraoperative Findings: Normal tubes, and normal right ovary. The left ovarian cortex had evidence of healing area consistent with cyst rupture and was slightly increased in size.  The uterus had several small clear gelatenous growths consistent with possible old endometriosis which were biopsied  .  Patient Condition: stable  Procedure in Detail:  Patient was taken to the operating room where she was administered general anesthesia.  She was positioned in the dorsal lithotomy position utilizing Allen stirups, prepped and draped in the usual sterile fashion.  Prior to proceeding with procedure a time out was performed.  Attention was turned to the patient's pelvis.  A red rubber catheter was used to empty the patient's bladder.  An operative speculum was placed to allow visualization of the cervix.  The anterior lip of the cervix was grasped with a single tooth tenaculum, and a Hulka tenaculum was placed to allow manipulation of the uterus.  The operative speculum and single tooth tenaculum were then removed.  Attention was turned to the patient's abdomen.  The umbilicus was infiltrated with 1% Sensorcaine, before making a stab incision using an 11 blade scalpel.  A 29mm Excel trocar was then used to gain direct entry into the peritoneal cavity utilizing the camera to visualize progress of the trocar during placement.  Once peritoneal entry had been achieved, insufflation was started and pneumoperitoneum established  at a pressure of 66mmHg. Two additional 46mm excel trocars were placed under direct visualiztion in the LUQ at Palmer's point and LLQ.  The umbilical trocar was stepped up to an 62mm excel trocar.General inspection of the abdomen revealed the above noted findings.   An atraumatic grasper was used to biopsy three of the gelatinous growth noted on the uterine serosa.  The IP ligament on the left was isolated and transected using a 52mm harmonic scalpel.  The ovary and tube were then transected off the mesosalpinx, before transecting them off their attachments to the uterine cornua.  The right tube was identified and likewise transected from its attachments to the uterus, mesosalpinx, and ovary.  Pedicles were inspected and noted to be hemostatic.  The 57mm trocar site was closed using a [place] and 0 Vicryl.    Pneumoperitoneum was evacuated.  The trocars were removed.  The 76mm trocar site was closed with 4-0 Monocryl in a subcuticular fashion.  All trocar sites were then dressed with surgical skin glue.  The Hulka tenaculum was removed.  Sponge needle and instrument counts were correct time two.  The patient tolerated the procedure well and was taken to the recovery room in stable condition.

## 2019-08-08 NOTE — Anesthesia Post-op Follow-up Note (Signed)
Anesthesia QCDR form completed.        

## 2019-08-08 NOTE — Anesthesia Postprocedure Evaluation (Signed)
Anesthesia Post Note  Patient: Elizabeth Good  Procedure(s) Performed: LAPAROSCOPIC BILATERAL SALPINGECTOMY LAPAROSCOPIC OOPHORECTOMY (Left ) UTERINE SEROSA BIOPSIES (N/A )  Patient location during evaluation: PACU Anesthesia Type: General Level of consciousness: awake and alert Pain management: pain level controlled Vital Signs Assessment: post-procedure vital signs reviewed and stable Respiratory status: spontaneous breathing and respiratory function stable Cardiovascular status: stable Anesthetic complications: no     Last Vitals:  Vitals:   08/08/19 1353 08/08/19 1426  BP:  119/73  Pulse: 87 86  Resp: 14 14  Temp:  36.9 C  SpO2: 95% 97%    Last Pain:  Vitals:   08/08/19 1426  TempSrc: Temporal  PainSc: 6                  Felicia Both K

## 2019-08-08 NOTE — H&P (Signed)
Date of Initial H&P: 08/03/2019  History reviewed, patient examined, no change in status, stable for surgery.

## 2019-08-08 NOTE — Transfer of Care (Signed)
Immediate Anesthesia Transfer of Care Note  Patient: Elizabeth Good  Procedure(s) Performed: LAPAROSCOPIC BILATERAL SALPINGECTOMY LAPAROSCOPIC OOPHORECTOMY, UTERINE SEROSA BIOPSIES (Left )  Patient Location: PACU  Anesthesia Type:General  Level of Consciousness: awake, alert , oriented and patient cooperative  Airway & Oxygen Therapy: Patient Spontanous Breathing and Patient connected to face mask oxygen  Post-op Assessment: Report given to RN and Post -op Vital signs reviewed and stable  Post vital signs: Reviewed and stable  Last Vitals:  Vitals Value Taken Time  BP 125/73 08/08/19 1317  Temp 37 C 08/08/19 1317  Pulse 84 08/08/19 1322  Resp 12 08/08/19 1322  SpO2 100 % 08/08/19 1322  Vitals shown include unvalidated device data.  Last Pain:  Vitals:   08/08/19 1037  TempSrc: Tympanic  PainSc: 0-No pain         Complications: No apparent anesthesia complications

## 2019-08-08 NOTE — Anesthesia Procedure Notes (Signed)
Procedure Name: Intubation Date/Time: 08/08/2019 11:58 AM Performed by: Eben Burow, CRNA Pre-anesthesia Checklist: Patient identified, Emergency Drugs available, Suction available and Patient being monitored Patient Re-evaluated:Patient Re-evaluated prior to induction Oxygen Delivery Method: Circle system utilized Preoxygenation: Pre-oxygenation with 100% oxygen Induction Type: IV induction Ventilation: Mask ventilation without difficulty Laryngoscope Size: Miller and 2 Grade View: Grade I Tube type: Oral Tube size: 7.0 mm Number of attempts: 1 Airway Equipment and Method: Stylet and LTA kit utilized Placement Confirmation: ETT inserted through vocal cords under direct vision,  positive ETCO2 and breath sounds checked- equal and bilateral Secured at: 20 cm Tube secured with: Tape Dental Injury: Teeth and Oropharynx as per pre-operative assessment

## 2019-08-08 NOTE — Progress Notes (Signed)
Patient resting soundly and comfortably, arouses easily To voice.

## 2019-08-08 NOTE — Discharge Instructions (Signed)

## 2019-08-08 NOTE — Anesthesia Preprocedure Evaluation (Addendum)
Anesthesia Evaluation  Patient identified by MRN, date of birth, ID band Patient awake    Reviewed: Allergy & Precautions, NPO status , Patient's Chart, lab work & pertinent test results  History of Anesthesia Complications (+) PONV, Family history of anesthesia reaction and history of anesthetic complications  Airway Mallampati: II       Dental   Pulmonary neg sleep apnea, neg COPD,           Cardiovascular hypertension, Pt. on medications (-) Past MI and (-) Orthopnea (-) dysrhythmias (-) Valvular Problems/Murmurs     Neuro/Psych Seizures - (last 22 years ago), Well Controlled,     GI/Hepatic Neg liver ROS, hiatal hernia, GERD  Medicated and Controlled,  Endo/Other  neg diabetes  Renal/GU Renal disease (stones)     Musculoskeletal   Abdominal   Peds  Hematology   Anesthesia Other Findings   Reproductive/Obstetrics                            Anesthesia Physical Anesthesia Plan  ASA: III  Anesthesia Plan: General   Post-op Pain Management:    Induction: Intravenous  PONV Risk Score and Plan: 4 or greater and Ondansetron, Dexamethasone, TIVA and Propofol infusion  Airway Management Planned: Oral ETT  Additional Equipment:   Intra-op Plan:   Post-operative Plan:   Informed Consent: I have reviewed the patients History and Physical, chart, labs and discussed the procedure including the risks, benefits and alternatives for the proposed anesthesia with the patient or authorized representative who has indicated his/her understanding and acceptance.       Plan Discussed with:   Anesthesia Plan Comments:         Anesthesia Quick Evaluation

## 2019-08-09 ENCOUNTER — Encounter: Payer: Self-pay | Admitting: Obstetrics and Gynecology

## 2019-08-09 LAB — SURGICAL PATHOLOGY

## 2019-08-09 NOTE — Progress Notes (Signed)
Pt is experiencing some drainage on your dressing therefore she changed the dressing yesterday and I alerted the pt if she continues to see drainage call Dr. Georgianne Fick.

## 2019-08-11 DIAGNOSIS — R921 Mammographic calcification found on diagnostic imaging of breast: Secondary | ICD-10-CM | POA: Diagnosis not present

## 2019-08-11 DIAGNOSIS — D242 Benign neoplasm of left breast: Secondary | ICD-10-CM | POA: Diagnosis not present

## 2019-08-11 DIAGNOSIS — N6022 Fibroadenosis of left breast: Secondary | ICD-10-CM | POA: Diagnosis not present

## 2019-08-11 DIAGNOSIS — N6489 Other specified disorders of breast: Secondary | ICD-10-CM | POA: Diagnosis not present

## 2019-08-11 DIAGNOSIS — N6092 Unspecified benign mammary dysplasia of left breast: Secondary | ICD-10-CM | POA: Diagnosis not present

## 2019-08-11 HISTORY — PX: BREAST BIOPSY: SHX20

## 2019-08-16 ENCOUNTER — Other Ambulatory Visit: Payer: Self-pay

## 2019-08-16 ENCOUNTER — Ambulatory Visit (INDEPENDENT_AMBULATORY_CARE_PROVIDER_SITE_OTHER): Payer: 59 | Admitting: Obstetrics and Gynecology

## 2019-08-16 ENCOUNTER — Encounter: Payer: Self-pay | Admitting: Obstetrics and Gynecology

## 2019-08-16 ENCOUNTER — Ambulatory Visit: Payer: 59 | Admitting: Obstetrics and Gynecology

## 2019-08-16 VITALS — BP 146/92 | HR 82 | Wt 200.0 lb

## 2019-08-16 DIAGNOSIS — Z4889 Encounter for other specified surgical aftercare: Secondary | ICD-10-CM

## 2019-08-16 NOTE — Progress Notes (Signed)
Postoperative Follow-up Patient presents post op from laproscopic LSO, RS  1weeks ago for adnexal mass.  Subjective: Patient reports some improvement in her preop symptoms. Eating a regular diet without difficulty. Pain is controlled without any medications.  Activity: normal activities of daily living.  Having some occasional discharge from LLQ trocar site  Objective: Blood pressure (!) 146/92, pulse 82, weight 200 lb (90.7 kg), last menstrual period 11/29/2017.  General: NAD Pulmonary: no increased work of breathing Abdomen: soft, non-tender, non-distended, incision(s) D/C/I Extremities: no edema Neurologic: normal gait    Admission on 08/08/2019, Discharged on 08/08/2019  Component Date Value Ref Range Status  . ABO/RH(D) 08/08/2019 O POS   Final  . Antibody Screen 08/08/2019 NEG   Final  . Sample Expiration 08/08/2019    Final                   Value:08/11/2019,2359 Performed at [facility], [address], [place], [place] [address]   . ABO/RH(D) 08/08/2019    Final                   Value:O POS Performed at [facility], [address], [place], [place] [address]   . SURGICAL PATHOLOGY 08/08/2019    Final-Edited                   Value:SURGICAL PATHOLOGY CASE: [id number] PATIENT: Elizabeth Good Surgical Pathology Report     Specimen Submitted: A. Uterus serosa; biopsies B. Ovary, left and bilateral fallopian tubes  Clinical History: Left ovarian cyst N83.202      DIAGNOSIS: A. UTERINE SEROSA; BIOPSY: - BENIGN SMOOTH MUSCLE FRAGMENTS WITH MESONEPHRIC REMNANTS AND SIMPLE CYST. - NEGATIVE FOR ATYPIA AND MALIGNANCY.  B. LEFT OVARY AND BILATERAL FALLOPIAN TUBES, LEFT OOPHORECTOMY WITH BILATERAL SALPINGECTOMY: - OVARY: BENIGN SIMPLE CYST.  NEGATIVE FOR MALIGNANCY. - FALLOPIAN TUBES: ENDOMETRIOSIS.  NEGATIVE FOR MALIGNANCY.   GROSS DESCRIPTION: A. Labeled: Uterine serosa biopsy Received: In formalin Tissue fragment(s):  3 Size: From 0.1-0.3 cm Description: White soft tissue fragments Entirely submitted in 1 cassette.  B. Labeled: Left ovary and bilateral fallopian tubes Received: In formalin Fallopian tube: Bilateral    Integrity: Intact, complete with fimbriated ends    Size: 4.0 cm                          in length and 0.4 cm in diameter    Serosa: Pink-tan, smooth, and shiny    On sections: Grossly unremarkable lumen present    Perforation: Not present Additional abnormality: Two paratubal cysts measuring 0.5 and 0.8 cm in greatest dimension which contain clear fluid. Ovary: Integrity: Intact Size: 3.0 x 2.5 x 1.0 cm Weight: 4 g Outer surface: White and smooth On sectioning: Single cyst measuring 2.0 x 1.4 x 0.8 cm which contains clear fluid Additional abnormalities: None Additional tissue: Not present Summary of Sections: 1-representative fallopian tube #1 with entirely submitted fimbriated end and paratubal cyst 2-representative fallopian tube #2 with entirely submitted fimbriated end and paratubal cyst 3-6-entirely submitted ovarian tissue     Final Diagnosis performed by Betsy Pries, MD.   Electronically signed 08/09/2019 2:41:51PM The electronic signature indicates that the named Attending Pathologist has evaluated the specimen Technical component p                         erformed at [facility], [address] [place], [place] [address] Lab:  [phone] Dir: Rush Farmer, MD, MMM  Professional component performed at [facility], [facility], [place], [place], [place] [address] Lab: [phone] Dir: Dellia Nims. Reuel Derby, MD     Assessment: 53 y.o. s/p laparoscopic LSO, RS stable  Plan: Patient has done well after surgery with no apparent complications.  I have discussed the post-operative course to date, and the expected progress moving forward.  The patient understands what complications to be concerned about.  I will see the patient in routine follow  up, or sooner if needed.    Activity plan: No heavy lifting  Return in about 5 weeks (around 09/20/2019) for 6 week postop phone or in person.    Malachy Mood, MD, [facility], [facility] 08/16/2019, 10:47 AM

## 2019-08-23 DIAGNOSIS — N6092 Unspecified benign mammary dysplasia of left breast: Secondary | ICD-10-CM | POA: Diagnosis not present

## 2019-08-23 DIAGNOSIS — N6082 Other benign mammary dysplasias of left breast: Secondary | ICD-10-CM | POA: Diagnosis not present

## 2019-09-19 DIAGNOSIS — Z01818 Encounter for other preprocedural examination: Secondary | ICD-10-CM | POA: Diagnosis not present

## 2019-09-19 DIAGNOSIS — Z20822 Contact with and (suspected) exposure to covid-19: Secondary | ICD-10-CM | POA: Diagnosis not present

## 2019-09-20 ENCOUNTER — Encounter: Payer: Self-pay | Admitting: Obstetrics and Gynecology

## 2019-09-20 ENCOUNTER — Other Ambulatory Visit: Payer: Self-pay

## 2019-09-20 ENCOUNTER — Ambulatory Visit (INDEPENDENT_AMBULATORY_CARE_PROVIDER_SITE_OTHER): Payer: 59 | Admitting: Obstetrics and Gynecology

## 2019-09-20 VITALS — BP 143/86 | HR 86 | Wt 195.0 lb

## 2019-09-20 DIAGNOSIS — Z4889 Encounter for other specified surgical aftercare: Secondary | ICD-10-CM

## 2019-09-20 NOTE — Progress Notes (Signed)
Postoperative Follow-up Patient presents post op from laparoscopic left oophorectomy  6weeks ago for adnexal mass.  Subjective: Patient reports marked improvement in her preop symptoms. Eating a regular diet without difficulty. The patient is not having any pain.  Activity: normal activities of daily living.  She has still noted off an on drainage LLQ port site but this stopped a few days ago  Objective: Blood pressure (!) 143/86, pulse 86, weight 195 lb (88.5 kg), last menstrual period 11/29/2017.  General: NAD Pulmonary: no increased work of breathing Abdomen: soft, non-tender, non-distended, incision(s) D/C/I Extremities: no edema Neurologic: normal gait    Admission on 08/08/2019, Discharged on 08/08/2019  Component Date Value Ref Range Status  . ABO/RH(D) 08/08/2019 O POS   Final  . Antibody Screen 08/08/2019 NEG   Final  . Sample Expiration 08/08/2019    Final                   Value:08/11/2019,2359 Performed at [facility], [address] [place], [place] [address]   . ABO/RH(D) 08/08/2019    Final                   Value:O POS Performed at [facility], [address], [place], [place] [address]   . SURGICAL PATHOLOGY 08/08/2019    Final-Edited                   Value:SURGICAL PATHOLOGY CASE: [id number] PATIENT: Jamyia Meunier Surgical Pathology Report     Specimen Submitted: A. Uterus serosa; biopsies B. Ovary, left and bilateral fallopian tubes  Clinical History: Left ovarian cyst N83.202      DIAGNOSIS: A. UTERINE SEROSA; BIOPSY: - BENIGN SMOOTH MUSCLE FRAGMENTS WITH MESONEPHRIC REMNANTS AND SIMPLE CYST. - NEGATIVE FOR ATYPIA AND MALIGNANCY.  B. LEFT OVARY AND BILATERAL FALLOPIAN TUBES, LEFT OOPHORECTOMY WITH BILATERAL SALPINGECTOMY: - OVARY: BENIGN SIMPLE CYST.  NEGATIVE FOR MALIGNANCY. - FALLOPIAN TUBES: ENDOMETRIOSIS.  NEGATIVE FOR MALIGNANCY.   GROSS DESCRIPTION: A. Labeled: Uterine serosa biopsy Received: In  formalin Tissue fragment(s): 3 Size: From 0.1-0.3 cm Description: White soft tissue fragments Entirely submitted in 1 cassette.  B. Labeled: Left ovary and bilateral fallopian tubes Received: In formalin Fallopian tube: Bilateral    Integrity: Intact, complete with fimbriated ends    Size: 4.0 cm                          in length and 0.4 cm in diameter    Serosa: Pink-tan, smooth, and shiny    On sections: Grossly unremarkable lumen present    Perforation: Not present Additional abnormality: Two paratubal cysts measuring 0.5 and 0.8 cm in greatest dimension which contain clear fluid. Ovary: Integrity: Intact Size: 3.0 x 2.5 x 1.0 cm Weight: 4 g Outer surface: White and smooth On sectioning: Single cyst measuring 2.0 x 1.4 x 0.8 cm which contains clear fluid Additional abnormalities: None Additional tissue: Not present Summary of Sections: 1-representative fallopian tube #1 with entirely submitted fimbriated end and paratubal cyst 2-representative fallopian tube #2 with entirely submitted fimbriated end and paratubal cyst 3-6-entirely submitted ovarian tissue     Final Diagnosis performed by Betsy Pries, MD.   Electronically signed 08/09/2019 2:41:51PM The electronic signature indicates that the named Attending Pathologist has evaluated the specimen Technical component p  erformed at [facility], [address], [place], [place] [address] Lab: [phone] Dir: Rush Farmer, MD, MMM  Professional component performed at [facility], [facility], [place], [place], [place] [address] Lab: [phone] Dir: Dellia Nims. Reuel Derby, MD     Assessment: 54 y.o. s/p laparoscopic left ovarian cystectomy stable  Plan: Patient has done well after surgery with no apparent complications.  I have discussed the post-operative course to date, and the expected progress moving forward.  The patient understands what complications to be concerned  about.  I will see the patient in routine follow up, or sooner if needed.    Activity plan: No restriction.   Malachy Mood, MD, [facility], [facility] 09/20/2019, 3:44 PM

## 2019-09-21 DIAGNOSIS — N6489 Other specified disorders of breast: Secondary | ICD-10-CM | POA: Diagnosis not present

## 2019-09-21 DIAGNOSIS — Z793 Long term (current) use of hormonal contraceptives: Secondary | ICD-10-CM | POA: Diagnosis not present

## 2019-09-21 DIAGNOSIS — I10 Essential (primary) hypertension: Secondary | ICD-10-CM | POA: Diagnosis not present

## 2019-09-21 DIAGNOSIS — K219 Gastro-esophageal reflux disease without esophagitis: Secondary | ICD-10-CM | POA: Diagnosis not present

## 2019-09-21 DIAGNOSIS — N6082 Other benign mammary dysplasias of left breast: Secondary | ICD-10-CM | POA: Diagnosis not present

## 2019-09-21 DIAGNOSIS — R921 Mammographic calcification found on diagnostic imaging of breast: Secondary | ICD-10-CM | POA: Diagnosis not present

## 2019-09-21 DIAGNOSIS — Z79899 Other long term (current) drug therapy: Secondary | ICD-10-CM | POA: Diagnosis not present

## 2019-09-21 DIAGNOSIS — Z9884 Bariatric surgery status: Secondary | ICD-10-CM | POA: Diagnosis not present

## 2019-09-21 DIAGNOSIS — N6092 Unspecified benign mammary dysplasia of left breast: Secondary | ICD-10-CM | POA: Diagnosis not present

## 2019-09-21 DIAGNOSIS — Z803 Family history of malignant neoplasm of breast: Secondary | ICD-10-CM | POA: Diagnosis not present

## 2019-09-21 DIAGNOSIS — N6022 Fibroadenosis of left breast: Secondary | ICD-10-CM | POA: Diagnosis not present

## 2019-09-21 HISTORY — PX: BREAST EXCISIONAL BIOPSY: SUR124

## 2019-09-25 DIAGNOSIS — N200 Calculus of kidney: Secondary | ICD-10-CM | POA: Diagnosis not present

## 2019-09-25 DIAGNOSIS — I10 Essential (primary) hypertension: Secondary | ICD-10-CM | POA: Diagnosis not present

## 2019-09-27 DIAGNOSIS — N6099 Unspecified benign mammary dysplasia of unspecified breast: Secondary | ICD-10-CM | POA: Diagnosis not present

## 2019-09-27 DIAGNOSIS — N6092 Unspecified benign mammary dysplasia of left breast: Secondary | ICD-10-CM | POA: Diagnosis not present

## 2019-09-27 DIAGNOSIS — N6082 Other benign mammary dysplasias of left breast: Secondary | ICD-10-CM | POA: Diagnosis not present

## 2019-10-04 DIAGNOSIS — L600 Ingrowing nail: Secondary | ICD-10-CM | POA: Diagnosis not present

## 2019-10-04 DIAGNOSIS — M79675 Pain in left toe(s): Secondary | ICD-10-CM | POA: Diagnosis not present

## 2019-10-27 DIAGNOSIS — M4306 Spondylolysis, lumbar region: Secondary | ICD-10-CM | POA: Diagnosis not present

## 2019-10-27 DIAGNOSIS — M5431 Sciatica, right side: Secondary | ICD-10-CM | POA: Diagnosis not present

## 2019-10-27 DIAGNOSIS — M9903 Segmental and somatic dysfunction of lumbar region: Secondary | ICD-10-CM | POA: Diagnosis not present

## 2019-10-27 DIAGNOSIS — M9905 Segmental and somatic dysfunction of pelvic region: Secondary | ICD-10-CM | POA: Diagnosis not present

## 2019-11-14 DIAGNOSIS — M5431 Sciatica, right side: Secondary | ICD-10-CM | POA: Diagnosis not present

## 2019-11-14 DIAGNOSIS — M9903 Segmental and somatic dysfunction of lumbar region: Secondary | ICD-10-CM | POA: Diagnosis not present

## 2019-11-14 DIAGNOSIS — M4306 Spondylolysis, lumbar region: Secondary | ICD-10-CM | POA: Diagnosis not present

## 2019-11-14 DIAGNOSIS — M9905 Segmental and somatic dysfunction of pelvic region: Secondary | ICD-10-CM | POA: Diagnosis not present

## 2019-12-21 IMAGING — MG MM BREAST BX W LOC DEV 1ST LESION IMAGE BX SPEC STEREO GUIDE*L*
8 of 10 series · 8 of 18 positions shown · non-contrast
Comparison: Previous exams.
COMPARISON: Previous exams.

Addendum:
CLINICAL DATA: Patient presents for stereotactic biopsy of the left
breast.

EXAM:
LEFT BREAST STEREOTACTIC CORE NEEDLE BIOPSY

[L (1 of 7)]
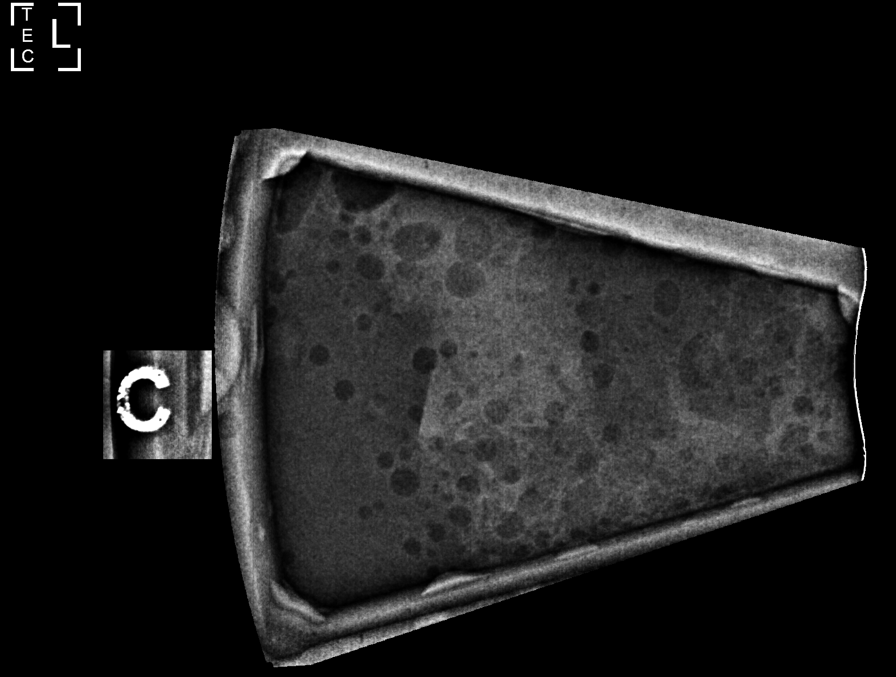

[L (2 of 7)]
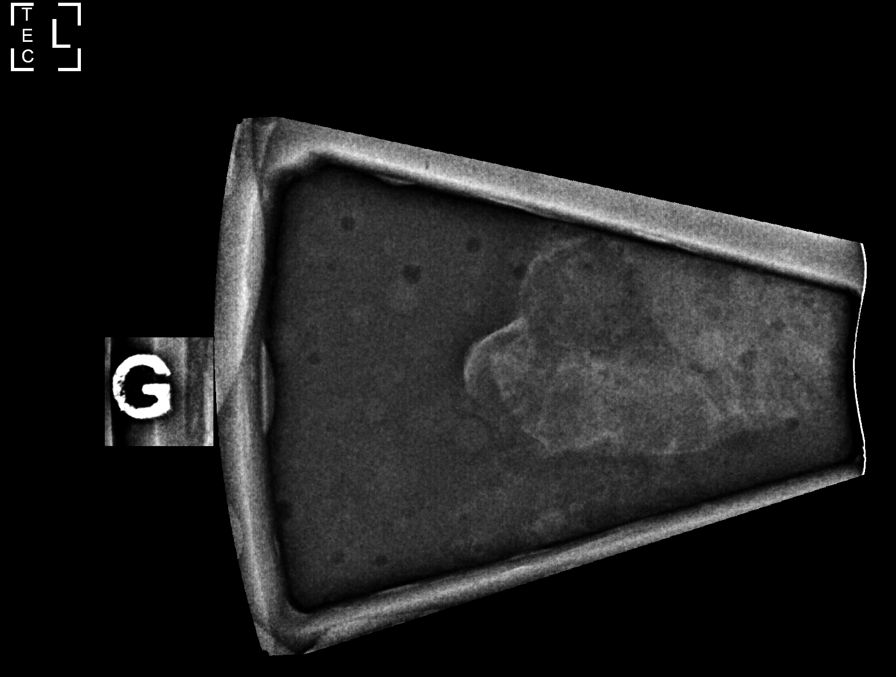

[L (3 of 7)]
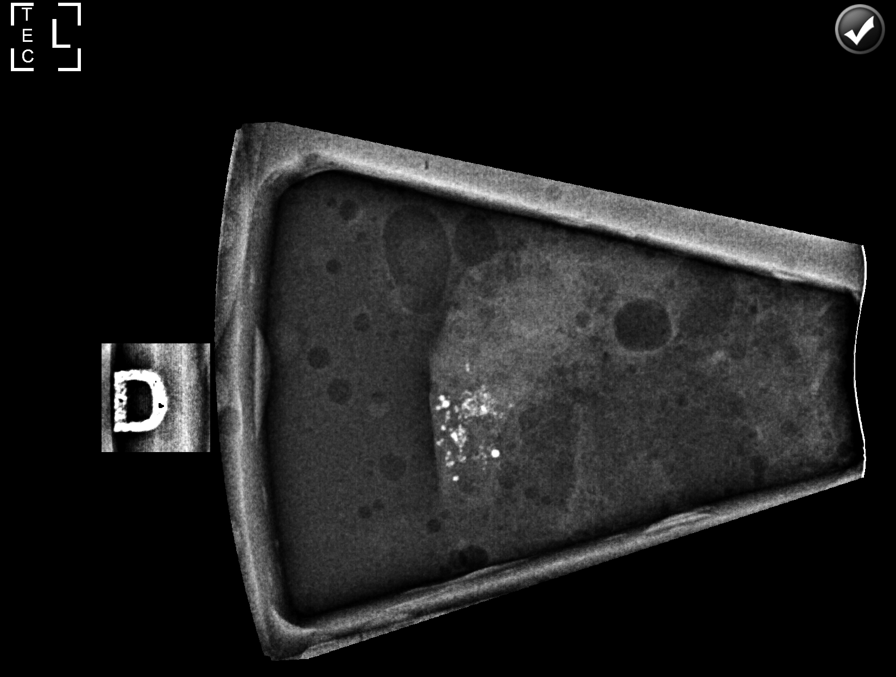

[L (4 of 7)]
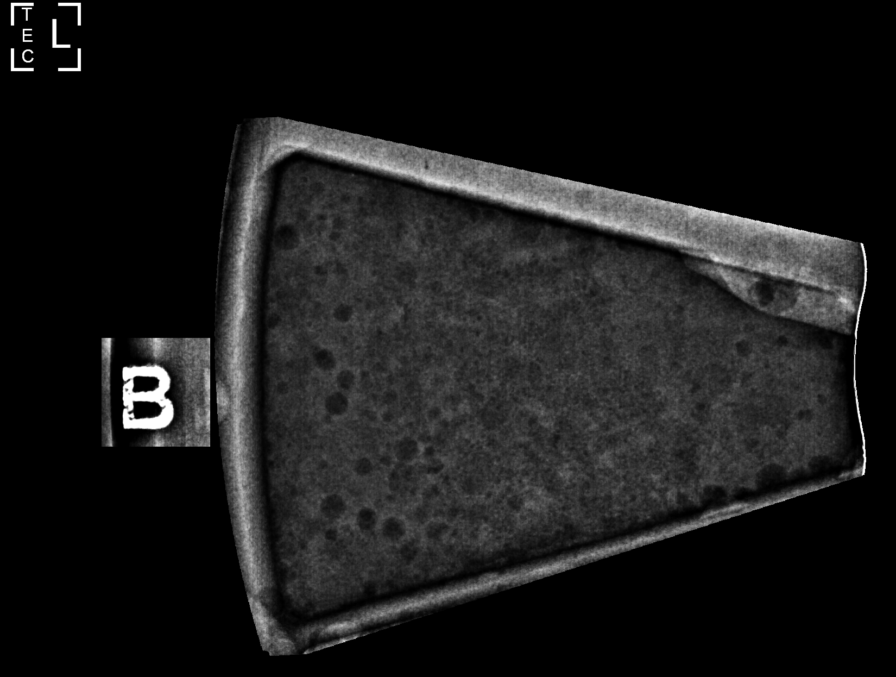

[L (5 of 7)]
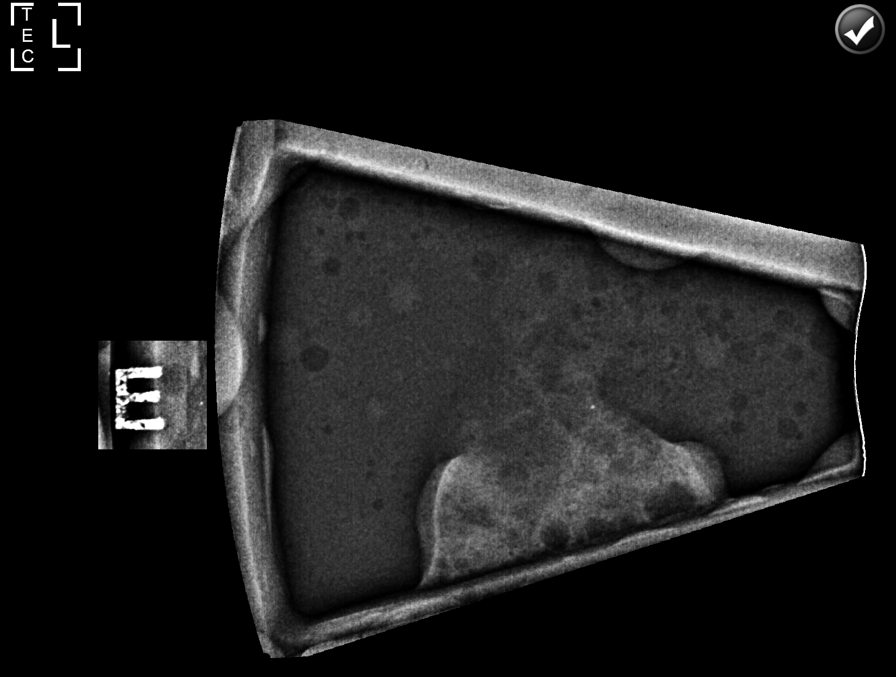

[L (6 of 7)]
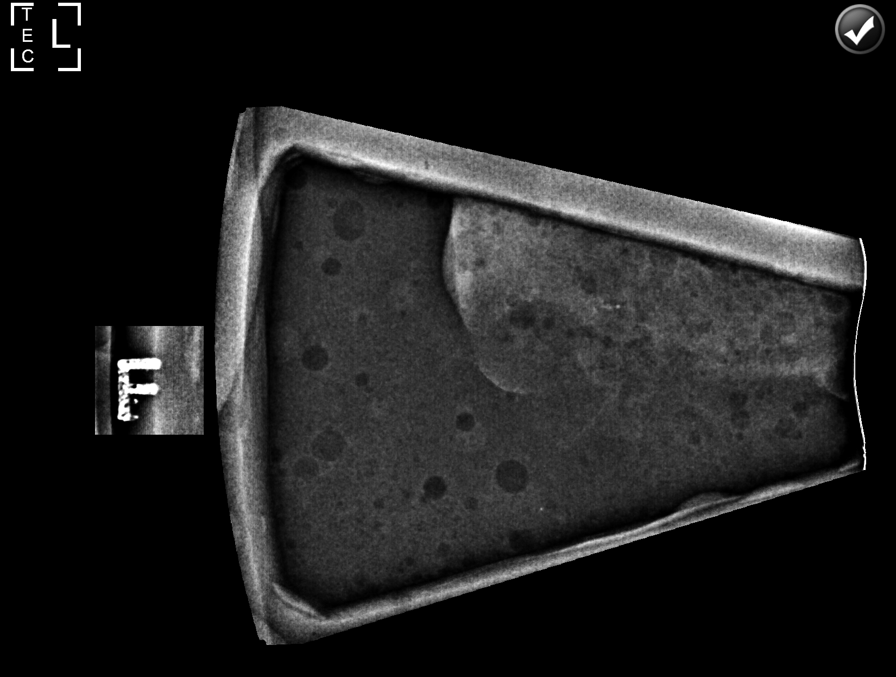

[L (7 of 7)]
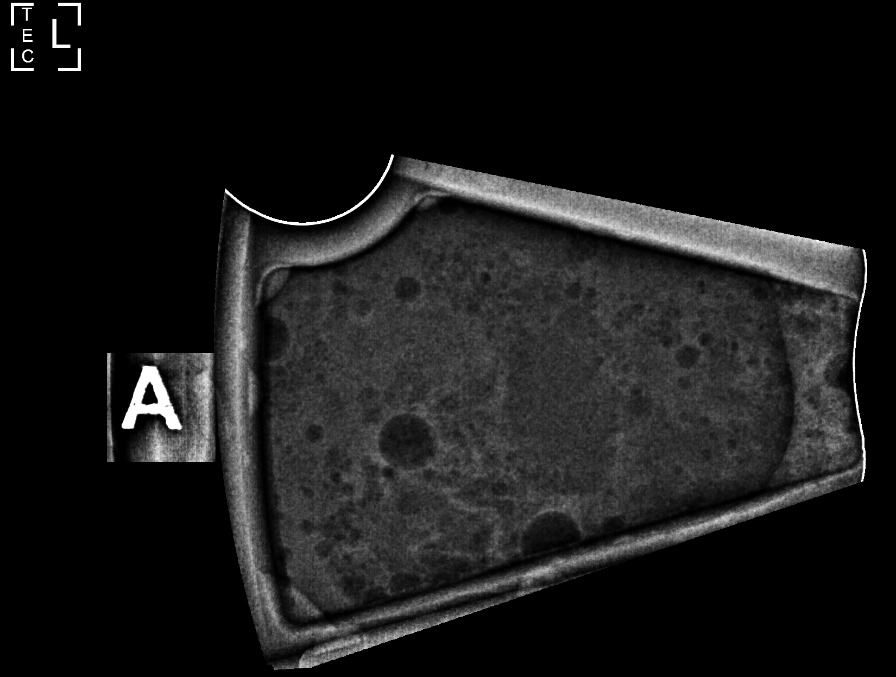

[L CC]
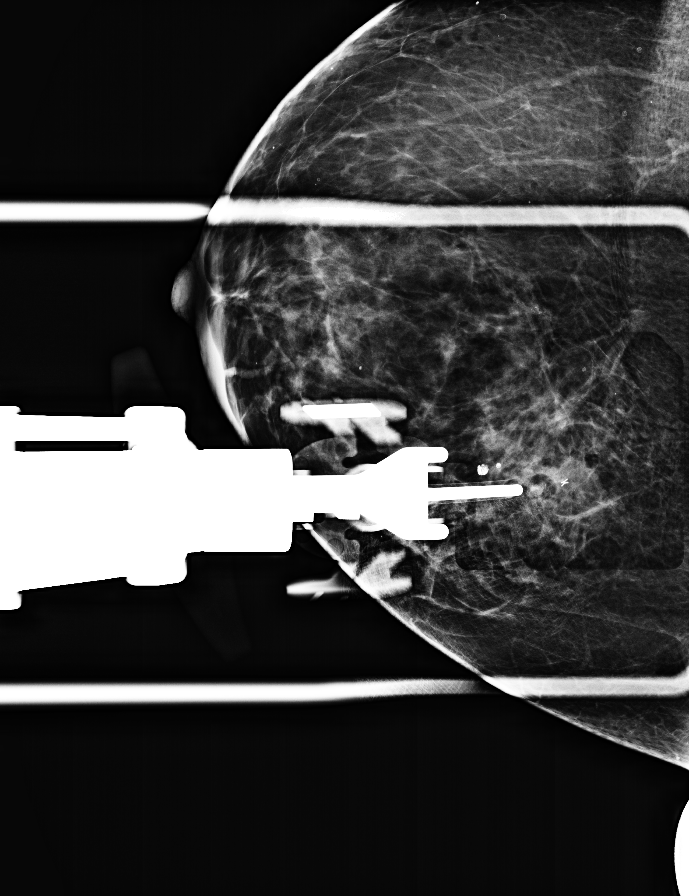

[8 of 18 positions shown; findings below may reference images not displayed]



Using sterile technique and 1% Lidocaine as local anesthetic, under
stereotactic guidance, a 9 gauge vacuum assisted device was used to
perform core needle biopsy of calcifications in the upper-outer
quadrant of the left breast using a superior approach. Specimen
radiograph was performed showing 3 of 7 specimens with
calcifications. Specimens with calcifications are identified for
pathology.

Lesion quadrant: Upper outer quadrant

At the conclusion of the procedure, an X tissue marker clip was
deployed into the biopsy cavity. Follow-up 2-view mammogram was
performed and dictated separately.
IMPRESSION: Stereotactic-guided biopsy of left breast calcifications in the
upper outer quadrant. No apparent complications.

ADDENDUM:
PATHOLOGY revealed: BREAST, LEFT UPPER OUTER QUADRANT; STEREOTACTIC
BIOPSY: - FOCAL ATYPICAL DUCT HYPERPLASIA WITMICROCALCIFICATIONS. -
FIBROADENOMATOID CHANGE.

Pathology results are CONCORDANT with imaging findings, per Dr.
Sobiso Hilmah.

Pathology results were discussed with patient via telephone. The
patient reported doing well after the biopsy with no adverse
symptoms, only tenderness at the site. Post biopsy care instructions
were reviewed and questions were answered. The patient was
encouraged to call [HOSPITAL] for any additional
questions or concerns.

Recommendation: Surgical referral for atypical ductal hyperplasia.
Before surgical referral, it is recommended patient undergo
stereotactic biopsy of another similar appearing group of
calcifications in LEFT breast. Patient will be assigned surgeon
referral post biopsy of LEFT breast. Tadanori Jayara RT at
[HOSPITAL] will schedule stereotactic biopsy and notify
patient with appointment.

Addendum by Gargi Tiger RN on 07/03/2019.



Using sterile technique and 1% Lidocaine as local anesthetic, under
stereotactic guidance, a 9 gauge vacuum assisted device was used to
perform core needle biopsy of calcifications in the upper-outer
quadrant of the left breast using a superior approach. Specimen
radiograph was performed showing 3 of 7 specimens with
calcifications. Specimens with calcifications are identified for
pathology.

Lesion quadrant: Upper outer quadrant

At the conclusion of the procedure, an X tissue marker clip was
deployed into the biopsy cavity. Follow-up 2-view mammogram was
performed and dictated separately.
IMPRESSION: Stereotactic-guided biopsy of left breast calcifications in the
upper outer quadrant. No apparent complications.

## 2019-12-30 ENCOUNTER — Other Ambulatory Visit: Payer: Self-pay

## 2019-12-30 ENCOUNTER — Ambulatory Visit: Payer: 59 | Attending: Internal Medicine

## 2019-12-30 DIAGNOSIS — Z23 Encounter for immunization: Secondary | ICD-10-CM

## 2019-12-30 NOTE — Progress Notes (Signed)
   Covid-19 Vaccination Clinic  Name:  Elizabeth Good    MRN: [id number] DOB: 1966-02-07  12/30/2019  [name] was observed post Covid-19 immunization for 15 minutes without incident. She was provided with Vaccine Information Sheet and instruction to access the V-Safe system.   [name] was instructed to call 911 with any severe reactions post vaccine: Marland Kitchen Difficulty breathing  . Swelling of face and throat  . A fast heartbeat  . A bad rash all over body  . Dizziness and weakness   Immunizations Administered    Name Date Dose VIS Date Route   Pfizer COVID-19 Vaccine 12/30/2019  9:12 AM 0.3 mL 10/25/2018 Intramuscular   Manufacturer: No Name   Lot: [id number]   [place]: [id number]

## 2020-01-23 ENCOUNTER — Ambulatory Visit: Payer: 59 | Attending: Internal Medicine

## 2020-01-23 DIAGNOSIS — Z23 Encounter for immunization: Secondary | ICD-10-CM

## 2020-01-23 NOTE — Progress Notes (Signed)
   Covid-19 Vaccination Clinic  Name:  Elizabeth Good    MRN: [id number] DOB: Apr 29, 1966  01/23/2020  [name] was observed post Covid-19 immunization for 15 minutes without incident. She was provided with Vaccine Information Sheet and instruction to access the V-Safe system.   [name] was instructed to call 911 with any severe reactions post vaccine: Marland Kitchen Difficulty breathing  . Swelling of face and throat  . A fast heartbeat  . A bad rash all over body  . Dizziness and weakness   Immunizations Administered    Name Date Dose VIS Date Route   Pfizer COVID-19 Vaccine 01/23/2020  3:09 PM 0.3 mL 10/25/2018 Intramuscular   Manufacturer: [employer], [employer]   Lot: [id number]   [place]: [id number]

## 2020-03-08 ENCOUNTER — Other Ambulatory Visit: Payer: Self-pay | Admitting: Family Medicine

## 2020-03-25 ENCOUNTER — Other Ambulatory Visit: Payer: Self-pay | Admitting: Family Medicine

## 2020-03-25 DIAGNOSIS — Z9884 Bariatric surgery status: Secondary | ICD-10-CM | POA: Diagnosis not present

## 2020-03-25 DIAGNOSIS — Z0000 Encounter for general adult medical examination without abnormal findings: Secondary | ICD-10-CM | POA: Diagnosis not present

## 2020-03-25 DIAGNOSIS — I10 Essential (primary) hypertension: Secondary | ICD-10-CM | POA: Diagnosis not present

## 2020-03-25 DIAGNOSIS — D508 Other iron deficiency anemias: Secondary | ICD-10-CM | POA: Diagnosis not present

## 2020-03-27 ENCOUNTER — Other Ambulatory Visit: Payer: Self-pay | Admitting: Family Medicine

## 2020-03-27 DIAGNOSIS — Z9884 Bariatric surgery status: Secondary | ICD-10-CM

## 2020-03-28 ENCOUNTER — Ambulatory Visit: Payer: 59 | Admitting: Obstetrics and Gynecology

## 2020-04-08 DIAGNOSIS — Z1239 Encounter for other screening for malignant neoplasm of breast: Secondary | ICD-10-CM | POA: Diagnosis not present

## 2020-04-08 DIAGNOSIS — R921 Mammographic calcification found on diagnostic imaging of breast: Secondary | ICD-10-CM | POA: Diagnosis not present

## 2020-04-08 DIAGNOSIS — Z87898 Personal history of other specified conditions: Secondary | ICD-10-CM | POA: Diagnosis not present

## 2020-04-08 DIAGNOSIS — R922 Inconclusive mammogram: Secondary | ICD-10-CM | POA: Diagnosis not present

## 2020-06-06 DIAGNOSIS — R922 Inconclusive mammogram: Secondary | ICD-10-CM | POA: Diagnosis not present

## 2020-06-06 DIAGNOSIS — R921 Mammographic calcification found on diagnostic imaging of breast: Secondary | ICD-10-CM | POA: Diagnosis not present

## 2020-06-06 DIAGNOSIS — Z853 Personal history of malignant neoplasm of breast: Secondary | ICD-10-CM | POA: Diagnosis not present

## 2020-06-06 DIAGNOSIS — Z87898 Personal history of other specified conditions: Secondary | ICD-10-CM | POA: Diagnosis not present

## 2020-06-06 DIAGNOSIS — Z1239 Encounter for other screening for malignant neoplasm of breast: Secondary | ICD-10-CM | POA: Diagnosis not present

## 2020-06-09 NOTE — Progress Notes (Addendum)
06/10/2020 3:19 PM   Elizabeth Good [birth year] [id number]  Referring provider: Gayland Curry, MD [address] [place],  [place] [address] Chief Complaint  Patient presents with  . Nephrolithiasis   Urologic history: 1.  Recurrent nephrolithiasis             -Recurrent calcium oxalate stone disease             -Shockwave lithotripsy 2015 and 2019             -Small residual 2 mm fragment              -Metabolic evaluation: Low urine volume, mild hyperoxaluria, low urine magnesium  HPI: Elizabeth Good is a 54 y.o. female who returns for a 1 year follow up of nephrolithiasis.   -Complaining of right back pain x 2 months. -No identifiable precipitating, aggravating or alleviating factors. -KUB on 04/27/2019 noted a small 2 mm right renal calculus, stable.  -She has had ongoing urinary symptoms x 1 month including urinary frequency, urgency with occasional dysuria.   PMH: Past Medical History:  Diagnosis Date  . Allergy   . Anemia   . Bartholin cyst 1990  . Basal cell carcinoma   . Curvature of spine   . Family history of adverse reaction to anesthesia    Mother - PONV  . GERD (gastroesophageal reflux disease)   . High cholesterol   . History of bladder infections   . History of hiatal hernia   . History of kidney infection   . History of kidney stones    h/o  . Hypertension   . Kidney stones   . Low serum iron   . Meningitis    Age 60  . PONV (postoperative nausea and vomiting)    hard to wake up  . PVC's (premature ventricular contractions)    with coffee  . Renal disorder   . Scoliosis   . Seizures ([place])    none since 05/1997.  on medication  . Wears contact lenses     Surgical History: Past Surgical History:  Procedure Laterality Date  . ANTERIOR AND POSTERIOR VAGINAL REPAIR     [facility]  . BIOPSY N/A 08/08/2019   Procedure: UTERINE SEROSA BIOPSIES;  Surgeon: Malachy Mood, MD;  Location: [facility] ORS;  Service: Gynecology;   Laterality: N/A;  . BLADDER SURGERY    . BREAST BIOPSY Left 06/29/2019   left breast stereo bx/ x clip/FOCAL ATYPICAL DUCT HYPERPLASIA WITH MICROCALCIFICATIONS.   Marland Kitchen BREAST BIOPSY Left 07/14/2019   left breast stereo bx/ coil clip/ path pending  . CHOLECYSTECTOMY  2011  . COMBINED HYSTEROSCOPY DIAGNOSTIC / D&C  12/06/2014   [facility]  . ENDOMETRIAL ABLATION  2011   [facility]  Dr. Amalia Hailey  . EXTRACORPOREAL SHOCK WAVE LITHOTRIPSY Right 08/25/2018   Procedure: EXTRACORPOREAL SHOCK WAVE LITHOTRIPSY (ESWL);  Surgeon: Billey Co, MD;  Location: [facility] ORS;  Service: Urology;  Laterality: Right;  . FOOT SURGERY Left   . GANGLION CYST EXCISION    . GASTRIC BYPASS  2011  . INCONTINENCE SURGERY    . LAPAROSCOPIC BILATERAL SALPINGECTOMY  08/08/2019   Procedure: LAPAROSCOPIC BILATERAL SALPINGECTOMY;  Surgeon: Malachy Mood, MD;  Location: [facility] ORS;  Service: Gynecology;;  . RECTAL SURGERY    . SEPTOPLASTY    . TONSILLECTOMY      Home Medications:  Allergies as of 06/10/2020      Reactions   Latex Rash   Dental appliances, balloons, gloves, condoms  Penicillins Rash   Did it involve swelling of the face/tongue/throat, SOB, or low BP? No Did it involve sudden or severe rash/hives, skin peeling, or any reaction on the inside of your mouth or nose? No Did you need to seek medical attention at a hospital or doctor's office? No When did it last happen? If all above answers are "NO", may proceed with cephalosporin use.   Sulfa Antibiotics Rash      Medication List       Accurate as of June 10, 2020  3:19 PM. If you have any questions, ask your nurse or doctor.        cetirizine 10 MG tablet Commonly known as: ZYRTEC Take 10 mg by mouth at bedtime.   dexlansoprazole 60 MG capsule Commonly known as: DEXILANT Take 60 mg by mouth at bedtime.   ibuprofen 600 MG tablet Commonly known as: ADVIL Take 1 tablet (600 mg total) by mouth every 6 (six) hours as needed.     LamoTRIgine 200 MG Tb24 24 hour tablet Take 200 mg by mouth 2 (two) times daily.   multivitamin tablet Take 1 tablet by mouth daily.   oxyCODONE-acetaminophen 5-325 MG tablet Commonly known as: Percocet Take 1 tablet by mouth every 4 (four) hours as needed for severe pain.   promethazine 12.5 MG tablet Commonly known as: PHENERGAN Take 1 tablet (12.5 mg total) by mouth every 6 (six) hours as needed for nausea. 1-hr prior to procedure   triamterene-hydrochlorothiazide 37.5-25 MG tablet Commonly known as: MAXZIDE-25 Take 1 tablet by mouth every morning.   Vitamin D3 125 MCG (5000 UT) Tabs Take 5,000 Units by mouth every 7 (seven) days.       Allergies:  Allergies  Allergen Reactions  . Latex Rash    Dental appliances, balloons, gloves, condoms  . Penicillins Rash    Did it involve swelling of the face/tongue/throat, SOB, or low BP? No Did it involve sudden or severe rash/hives, skin peeling, or any reaction on the inside of your mouth or nose? No Did you need to seek medical attention at a hospital or doctor's office? No When did it last happen? If all above answers are "NO", may proceed with cephalosporin use.   . Sulfa Antibiotics Rash    Family History: Family History  Problem Relation Age of Onset  . Bladder Cancer Mother   . Prostate cancer Father   . Melanoma Father   . Breast cancer Cousin     Social History:  reports that she has never smoked. She has never used smokeless tobacco. She reports current alcohol use. She reports that she does not use drugs.   Physical Exam: BP 122/77   Pulse 88   Ht 5\' 5"  (1.651 m)   Wt 199 lb (90.3 kg)   LMP 11/29/2017 (Approximate)   BMI 33.12 kg/m   Constitutional:  Alert and oriented, No acute distress. HEENT: [place] AT, moist mucus membranes.  Trachea midline, no masses. Cardiovascular: No clubbing, cyanosis, or edema. Respiratory: Normal respiratory effort, no increased work of breathing. Skin: No rashes,  bruises or suspicious lesions. Neurologic: Grossly intact, no focal deficits, moving all 4 extremities. Psychiatric: Normal mood and affect.  Laboratory Data:  Lab Results  Component Value Date   CREATININE 0.74 04/29/2017    Assessment & Plan:    1. Nephrolithiasis  Recent onset of worsening flank pain  KUB ordered and she will be notified with the results  With onset of irritative symptoms may need additional imaging  to evaluate for a distal ureteral calculus including stone protocol CT  Urinalysis today unremarkable   [facility] [address] [place], [place] [address] [phone]  I, Selena Batten, am acting as a scribe for Dr. Nicki Reaper C. Nilson Tabora,  I have reviewed the above documentation for accuracy and completeness, and I agree with the above.   Abbie Sons, MD

## 2020-06-10 ENCOUNTER — Ambulatory Visit
Admission: RE | Admit: 2020-06-10 | Discharge: 2020-06-10 | Disposition: A | Discharge status: Home / Self Care | Payer: 59 | Source: Ambulatory Visit | Attending: Urology | Admitting: Urology

## 2020-06-10 ENCOUNTER — Encounter: Payer: Self-pay | Admitting: Urology

## 2020-06-10 ENCOUNTER — Other Ambulatory Visit: Payer: Self-pay

## 2020-06-10 ENCOUNTER — Ambulatory Visit: Payer: 59 | Admitting: Urology

## 2020-06-10 VITALS — BP 122/77 | HR 88 | Ht 65.0 in | Wt 199.0 lb

## 2020-06-10 DIAGNOSIS — Z9049 Acquired absence of other specified parts of digestive tract: Secondary | ICD-10-CM | POA: Diagnosis not present

## 2020-06-10 DIAGNOSIS — N200 Calculus of kidney: Secondary | ICD-10-CM

## 2020-06-10 DIAGNOSIS — R109 Unspecified abdominal pain: Secondary | ICD-10-CM | POA: Diagnosis not present

## 2020-06-10 DIAGNOSIS — M4186 Other forms of scoliosis, lumbar region: Secondary | ICD-10-CM | POA: Diagnosis not present

## 2020-06-11 LAB — URINALYSIS, COMPLETE
Bilirubin, UA: NEGATIVE
Glucose, UA: NEGATIVE
Ketones, UA: NEGATIVE
Leukocytes,UA: NEGATIVE
Nitrite, UA: NEGATIVE
Protein,UA: NEGATIVE
RBC, UA: NEGATIVE
Specific Gravity, UA: 1.015 (ref 1.005–1.030)
Urobilinogen, Ur: 0.2 mg/dL (ref 0.2–1.0)
pH, UA: 5.5 (ref 5.0–7.5)

## 2020-06-11 LAB — MICROSCOPIC EXAMINATION: Bacteria, UA: NONE SEEN

## 2020-06-12 ENCOUNTER — Encounter: Payer: Self-pay | Admitting: Urology

## 2020-06-13 ENCOUNTER — Telehealth: Payer: Self-pay | Admitting: Urology

## 2020-06-13 DIAGNOSIS — R109 Unspecified abdominal pain: Secondary | ICD-10-CM

## 2020-06-13 DIAGNOSIS — Z87442 Personal history of urinary calculi: Secondary | ICD-10-CM

## 2020-06-13 MED ORDER — TAMSULOSIN HCL 0.4 MG PO CAPS: 0.4000 mg | ORAL_CAPSULE | Freq: Every day | ORAL | 0 refills | Status: DC

## 2020-06-13 NOTE — Telephone Encounter (Signed)
CT ordered, will call with results.  Rx tamsulosin sent

## 2020-06-13 NOTE — Telephone Encounter (Signed)
Patient notified and voiced understanding. She is wanting to do the CT scan. She is still having pain. She also states you told her you were going to send Tamsulosin to the pharmacy and it has not been sent yet. Please advise.

## 2020-06-13 NOTE — Telephone Encounter (Signed)
Urinalysis showed no abnormalities.  I do not see the previously defined right renal calculus.  It may have migrated to the ureter and based on size should pass.  We can schedule a CT to verify if the stone is present or not versus given a brief trial of passage and delay in scan if symptoms persist.

## 2020-06-14 ENCOUNTER — Other Ambulatory Visit: Payer: Self-pay | Admitting: Urology

## 2020-06-14 MED ORDER — TAMSULOSIN HCL 0.4 MG PO CAPS: 0.4000 mg | ORAL_CAPSULE | Freq: Every day | ORAL | 0 refills | Status: DC

## 2020-06-14 NOTE — Telephone Encounter (Signed)
Pt aware.

## 2020-06-14 NOTE — Addendum Note (Signed)
Addended by: Alvera Novel on: 06/14/2020 12:57 PM   Modules accepted: Orders

## 2020-07-11 ENCOUNTER — Other Ambulatory Visit: Payer: Self-pay

## 2020-07-11 ENCOUNTER — Ambulatory Visit (INDEPENDENT_AMBULATORY_CARE_PROVIDER_SITE_OTHER): Payer: 59 | Admitting: Obstetrics and Gynecology

## 2020-07-11 ENCOUNTER — Encounter: Payer: Self-pay | Admitting: Obstetrics and Gynecology

## 2020-07-11 VITALS — BP 146/77 | Ht 64.0 in | Wt 198.0 lb

## 2020-07-11 DIAGNOSIS — N950 Postmenopausal bleeding: Secondary | ICD-10-CM | POA: Diagnosis not present

## 2020-07-11 NOTE — Progress Notes (Signed)
Gynecology H&P  Chief Complaint:  Chief Complaint  Patient presents with   Vaginal Bleeding    spotting, right side lower back pain    History of Present Illness: Patient is a 54 y.o. G2P2 presents evaluation of postmenopausal bleeding. The patient states she has had a single episode(s) of bleeding in the past 1 week(s).  The bleeding has been limited to spotting . She describes the blood as Bright red in appearance.  She has had right side lower back pain as well. She deniestrauma or other inciting event. She does not have a history of abnormal pap smears.  Her last pap smear was 11/25/2017 NILM HPV negative .  Prior endometrial sampling 12/06/2014 and 11/25/2017 no evidence of hyperplasia or malignancy.  Prior ultrasounds showing thin endometrium 1.108mm 07/19/2019.  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  There are no other aggravating factors reported. There are no alleviating factors reported.   She hashad prior work up for postmenopausal bleeding.  Review of Systems: 10 point review of systems negative unless otherwise noted in HPI  Past Medical History:  Patient Active Problem List   Diagnosis Date Noted   GERD (gastroesophageal reflux disease) 09/28/2018   Seizures ([place]) 09/28/2018   Personal history of kidney stones 06/23/2018   Microhematuria 06/23/2018   Dysuria 06/23/2018   Calcium, urinary 06/16/2018    Elevated, checked post gastric bypass due to do increased risk for long term osteoporosis. This number places at risk for kidney stone formation- pt advised to hydrate well and if stones form in future we may would consider use of thiazide like chlorthadone or dyazide.    Varicose veins of both lower extremities with pain 02/11/2018   Chronic venous insufficiency 02/11/2018   Iron deficiency anemia 03/25/2017   H/O gastric bypass 03/25/2017    Uterus, adenomyosis 03/08/2017   Hamartoma ([place]) 03/11/2016    Overview:  Left breast. ( benign tumor)    High cholesterol 02/25/2016    Overview:  The 10-year ASCVD risk score Mikey Bussing DC Brooke Bonito, et al., 2013) is: 0.9%-2017 The 10-year ASCVD risk score Mikey Bussing DC Jr., et al., 2013) is: 1.1%-2018    Chronic cystitis 08/14/2013   Kidney stone 96/11/5407   Renal colic 81/19/1478    Past Surgical History:  Past Surgical History:  Procedure Laterality Date   [facility]   BIOPSY N/A 08/08/2019   Procedure: UTERINE SEROSA BIOPSIES;  Surgeon: Malachy Mood, MD;  Location: [facility] ORS;  Service: Gynecology;  Laterality: N/A;   BLADDER SURGERY     BREAST BIOPSY Left 06/29/2019   left breast stereo bx/ x clip/FOCAL ATYPICAL DUCT HYPERPLASIA WITH MICROCALCIFICATIONS.    BREAST BIOPSY Left 07/14/2019   left breast stereo bx/ coil clip/ path pending   CHOLECYSTECTOMY  2011   COMBINED HYSTEROSCOPY DIAGNOSTIC / D&C  12/06/2014   [facility]   ENDOMETRIAL ABLATION  2011   [facility]  Dr. Amalia Hailey   EXTRACORPOREAL SHOCK WAVE LITHOTRIPSY Right 08/25/2018   Procedure: EXTRACORPOREAL SHOCK WAVE LITHOTRIPSY (ESWL);  Surgeon: Billey Co, MD;  Location: [facility] ORS;  Service: Urology;  Laterality: Right;   FOOT SURGERY Left    GANGLION CYST EXCISION     GASTRIC BYPASS  2011   INCONTINENCE SURGERY     LAPAROSCOPIC BILATERAL SALPINGECTOMY  08/08/2019   Procedure: LAPAROSCOPIC BILATERAL SALPINGECTOMY;  Surgeon: Georgianne Fick,  Conan Bowens, MD;  Location: [facility] ORS;  Service: Gynecology;;   RECTAL SURGERY     SEPTOPLASTY     TONSILLECTOMY      Family History:  Family History  Problem Relation Age of Onset   Bladder Cancer Mother    Prostate cancer Father    Melanoma Father    Breast cancer Cousin     Social History:  Social History   Socioeconomic History   Marital status: Legally Separated    Spouse name: Not on file   Number  of children: Not on file   Years of education: Not on file   Highest education level: Not on file  Occupational History   Not on file  Tobacco Use   Smoking status: Never Smoker   Smokeless tobacco: Never Used  Vaping Use   Vaping Use: Never used  Substance and Sexual Activity   Alcohol use: Yes    Alcohol/week: 0.0 standard drinks    Comment: social - 1 glass wine/month   Drug use: No   Sexual activity: Yes    Birth control/protection: Pill  Other Topics Concern   Not on file  Social History Narrative   Not on file   Social Determinants of Health   Financial Resource Strain:    Difficulty of Paying Living Expenses: Not on file  Food Insecurity:    Worried About Charity fundraiser in the Last Year: Not on file   [employer] of Food in the Last Year: Not on file  Transportation Needs:    Lack of Transportation (Medical): Not on file   Lack of Transportation (Non-Medical): Not on file  Physical Activity:    Days of Exercise per Week: Not on file   Minutes of Exercise per Session: Not on file  Stress:    Feeling of Stress : Not on file  Social Connections:    Frequency of Communication with Friends and Family: Not on file   Frequency of Social Gatherings with Friends and Family: Not on file   Attends Religious Services: Not on file   Active Member of Clubs or Organizations: Not on file   Attends Archivist Meetings: Not on file   Marital Status: Not on file  Intimate Partner Violence:    Fear of Current or Ex-Partner: Not on file   Emotionally Abused: Not on file   Physically Abused: Not on file   Sexually Abused: Not on file    Allergies:  Allergies  Allergen Reactions   Latex Rash    Dental appliances, balloons, gloves, condoms   Penicillins Rash    Did it involve swelling of the face/tongue/throat, SOB, or low BP? No Did it involve sudden or severe rash/hives, skin peeling, or any reaction on the inside of your mouth or  nose? No Did you need to seek medical attention at a hospital or doctor's office? No When did it last happen? If all above answers are NO, may proceed with cephalosporin use.    Sulfa Antibiotics Rash    Medications: Prior to Admission medications   Medication Sig Start Date End Date Taking? Authorizing Provider  cetirizine (ZYRTEC) 10 MG tablet Take 10 mg by mouth at bedtime.    Yes [provider]  Cholecalciferol (VITAMIN D3) 5000 units TABS Take 5,000 Units by mouth every 7 (seven) days.  10/12/11  Yes [provider]  dexlansoprazole (DEXILANT) 60 MG capsule Take 60 mg by mouth at bedtime.    Yes [provider]  ibuprofen (  ADVIL) 600 MG tablet Take 1 tablet (600 mg total) by mouth every 6 (six) hours as needed. 08/08/19  Yes Malachy Mood, MD  LamoTRIgine 200 MG TB24 24 hour tablet Take 200 mg by mouth 2 (two) times daily.    Yes [provider]  Multiple Vitamin (MULTIVITAMIN) tablet Take 1 tablet by mouth daily.   Yes [provider]  triamterene-hydrochlorothiazide (MAXZIDE-25) 37.5-25 MG tablet Take 1 tablet by mouth every morning.    Yes [provider]    Physical Exam Vitals: Blood pressure (!) 146/77, height 5\' 4"  (1.626 m), weight 198 lb (89.8 kg), last menstrual period 11/29/2017.  General: NAD HEENT: normocephalic, anicteric Pulmonary: No increased work of breathing Genitourinary:  External: Normal external female genitalia.  Normal urethral meatus, normal  Bartholin's and Skene's glands.    Vagina: Normal vaginal mucosa, no evidence of prolapse.    Cervix: Grossly normal in appearance, no bleeding  Rectal: deferred Extremities: no edema, erythema, or tenderness Neurologic: Grossly intact Psychiatric: mood appropriate, affect full  Assessment: 54 y.o. G2P2 presenting for evaluation of postmenopausal bleeding  Plan: Problem List Items Addressed This Visit    None    Visit Diagnoses    PMB  (postmenopausal bleeding)    -  Primary      1) We discussed that menopause is a clinical diagnosis made after 12 months of amenorrhea.  The average age of menopause in the  [place] population is 47 but there may be significant variation.  Any bleeding that happens after a 12 month period of amenorrhea warrants further work.  Possible etiologies of postmenopausal bleeding were discussed with the patient today.  These may range from benign etiologies such as urethral prolapse and atrophy, to indeterminate lesions such as submucosal fibroids or polyps which would require resection to accurately evaluate. The role of unopposed estrogen in the development of  dndometrial hyperplasia or carcinoma is discussed.  The risk of endometrial hyperplasia is linearly correlated with increasing BMI given the production of estrone by adipose tissue.  Work up will be include transvaginal ultrasound to assess the thickness of the endometrial lining as well as to assess for focal uterine lesions.  Negative ultrasound evaluation, defined as the absence of focal lesions and endometrial stripe of <80mm, effectively rules out carcinoma and confirms atrophy as the most likely etiology.  Should focal lesions be present these generally require hysteroscopic resection.  Should lining be greater >53mm endometrial biopsy is warranted to rule out hyperplasia or frank endometrial cancer.  Continued episodes of bleeding despite negative ultrasound also warrant endometrial sampling.  As the cervical pathology may also be implicated in postmenopausal bleeding prior cervical cytology was reviewed and repeated if required per ASCCP guidelines.   2) Evaluation by pelvc ultrasound scheduled, with follow up after [place]. EMB discussed and may be performed as well. Pros and cons of these modalities of testing discussed.   3) Return in about 1 week (around 07/18/2020) for GYN [place] and follow up (can be overbook later in afternoon).   Malachy Mood, MD, [facility], [facility] 07/11/2020, 9:43 AM

## 2020-07-16 ENCOUNTER — Ambulatory Visit (INDEPENDENT_AMBULATORY_CARE_PROVIDER_SITE_OTHER): Payer: 59

## 2020-07-16 ENCOUNTER — Encounter: Payer: Self-pay | Admitting: Obstetrics and Gynecology

## 2020-07-16 ENCOUNTER — Other Ambulatory Visit: Payer: Self-pay | Admitting: Obstetrics and Gynecology

## 2020-07-16 ENCOUNTER — Ambulatory Visit (INDEPENDENT_AMBULATORY_CARE_PROVIDER_SITE_OTHER): Payer: 59 | Admitting: Obstetrics and Gynecology

## 2020-07-16 ENCOUNTER — Other Ambulatory Visit: Payer: Self-pay

## 2020-07-16 VITALS — BP 122/70 | Ht 64.0 in | Wt 199.0 lb

## 2020-07-16 DIAGNOSIS — N950 Postmenopausal bleeding: Secondary | ICD-10-CM

## 2020-07-16 NOTE — Progress Notes (Signed)
Gynecology Ultrasound Follow Up  Chief Complaint:  Chief Complaint  Patient presents with  . Gynecologic Exam     History of Present Illness: Patient is a 54 y.o. female who presents today for ultrasound evaluation of PMB .  Ultrasound demonstrates the following findgins Adnexa: no adnexal mass or pathology visualized Uterus: Non-enlarged uterus, homogenous myometrial echotexture with endometrial stripe without evidence of focal lesions and well under 56mm Additional: no free fluid  Review of Systems: Review of Systems  Constitutional: Negative.   Gastrointestinal: Negative.   Genitourinary: Negative.   Endo/Heme/Allergies: Does not bruise/bleed easily.    Past Medical History:  Past Medical History:  Diagnosis Date  . Allergy   . Anemia   . Bartholin cyst 1990  . Basal cell carcinoma   . Curvature of spine   . Family history of adverse reaction to anesthesia    Mother - PONV  . GERD (gastroesophageal reflux disease)   . High cholesterol   . History of bladder infections   . History of hiatal hernia   . History of kidney infection   . History of kidney stones    h/o  . Hypertension   . Kidney stones   . Low serum iron   . Meningitis    Age 54  . PONV (postoperative nausea and vomiting)    hard to wake up  . PVC's (premature ventricular contractions)    with coffee  . Renal disorder   . Scoliosis   . Seizures ([place])    none since 05/1997.  on medication  . Wears contact lenses     Past Surgical History:  Past Surgical History:  Procedure Laterality Date  . ANTERIOR AND POSTERIOR VAGINAL REPAIR     [facility]  . BIOPSY N/A 08/08/2019   Procedure: UTERINE SEROSA BIOPSIES;  Surgeon: Malachy Mood, MD;  Location: [facility] ORS;  Service: Gynecology;  Laterality: N/A;  . BLADDER SURGERY    . BREAST BIOPSY Left 06/29/2019   left breast stereo bx/ x clip/FOCAL ATYPICAL DUCT HYPERPLASIA WITH MICROCALCIFICATIONS.   Marland Kitchen BREAST BIOPSY Left 07/14/2019   left  breast stereo bx/ coil clip/ path pending  . CHOLECYSTECTOMY  2011  . COMBINED HYSTEROSCOPY DIAGNOSTIC / D&C  12/06/2014   [facility]  . ENDOMETRIAL ABLATION  2011   [facility]  Dr. Amalia Hailey  . EXTRACORPOREAL SHOCK WAVE LITHOTRIPSY Right 08/25/2018   Procedure: EXTRACORPOREAL SHOCK WAVE LITHOTRIPSY (ESWL);  Surgeon: Billey Co, MD;  Location: [facility] ORS;  Service: Urology;  Laterality: Right;  . FOOT SURGERY Left   . GANGLION CYST EXCISION    . GASTRIC BYPASS  2011  . INCONTINENCE SURGERY    . LAPAROSCOPIC BILATERAL SALPINGECTOMY  08/08/2019   Procedure: LAPAROSCOPIC BILATERAL SALPINGECTOMY;  Surgeon: Malachy Mood, MD;  Location: [facility] ORS;  Service: Gynecology;;  . RECTAL SURGERY    . SEPTOPLASTY    . TONSILLECTOMY      Gynecologic History:  Patient's last menstrual period was 11/29/2017 (approximate).   Family History:  Family History  Problem Relation Age of Onset  . Bladder Cancer Mother   . Prostate cancer Father   . Melanoma Father   . Breast cancer Cousin     Social History:  Social History   Socioeconomic History  . Marital status: Legally Separated    Spouse name: Not on file  . Number of children: Not on file  . Years of education: Not on file  . Highest education level: Not on file  Occupational  History  . Not on file  Tobacco Use  . Smoking status: Never Smoker  . Smokeless tobacco: Never Used  Vaping Use  . Vaping Use: Never used  Substance and Sexual Activity  . Alcohol use: Yes    Alcohol/week: 0.0 standard drinks    Comment: social - 1 glass wine/month  . Drug use: No  . Sexual activity: Yes    Birth control/protection: Pill  Other Topics Concern  . Not on file  Social History Narrative  . Not on file   Social Determinants of Health   Financial Resource Strain:   . Difficulty of Paying Living Expenses: Not on file  Food Insecurity:   . Worried About Charity fundraiser in the Last Year: Not on file  . Ran Out of Food in the Last  Year: Not on file  Transportation Needs:   . Lack of Transportation (Medical): Not on file  . Lack of Transportation (Non-Medical): Not on file  Physical Activity:   . Days of Exercise per Week: Not on file  . Minutes of Exercise per Session: Not on file  Stress:   . Feeling of Stress : Not on file  Social Connections:   . Frequency of Communication with Friends and Family: Not on file  . Frequency of Social Gatherings with Friends and Family: Not on file  . Attends Religious Services: Not on file  . Active Member of Clubs or Organizations: Not on file  . Attends Archivist Meetings: Not on file  . Marital Status: Not on file  Intimate Partner Violence:   . Fear of Current or Ex-Partner: Not on file  . Emotionally Abused: Not on file  . Physically Abused: Not on file  . Sexually Abused: Not on file    Allergies:  Allergies  Allergen Reactions  . Dermabase [Albolene]   . Latex Rash    Dental appliances, balloons, gloves, condoms  . Penicillins Rash    Did it involve swelling of the face/tongue/throat, SOB, or low BP? No Did it involve sudden or severe rash/hives, skin peeling, or any reaction on the inside of your mouth or nose? No Did you need to seek medical attention at a hospital or doctor's office? No When did it last happen? If all above answers are "NO", may proceed with cephalosporin use.   . Sulfa Antibiotics Rash    Medications: Prior to Admission medications   Medication Sig Start Date End Date Taking? Authorizing Provider  cetirizine (ZYRTEC) 10 MG tablet Take 10 mg by mouth at bedtime.    Yes [provider]  Cholecalciferol (VITAMIN D3) 5000 units TABS Take 5,000 Units by mouth every 7 (seven) days.  10/12/11  Yes [provider]  dexlansoprazole (DEXILANT) 60 MG capsule Take 60 mg by mouth at bedtime.    Yes [provider]  ibuprofen (ADVIL) 600 MG tablet Take 1 tablet (600 mg total) by mouth every 6 (six) hours as  needed. 08/08/19  Yes Malachy Mood, MD  LamoTRIgine 200 MG TB24 24 hour tablet Take 200 mg by mouth 2 (two) times daily.    Yes [provider]  Multiple Vitamin (MULTIVITAMIN) tablet Take 1 tablet by mouth daily.   Yes [provider]  triamterene-hydrochlorothiazide (MAXZIDE-25) 37.5-25 MG tablet Take 1 tablet by mouth every morning.    Yes [provider]    Physical Exam Vitals: Blood pressure 122/70, height 5\' 4"  (1.626 m), weight 199 lb (90.3 kg), last menstrual period 11/29/2017.  General: NAD HEENT: normocephalic, anicteric Pulmonary: No increased work of breathing Extremities: no edema, erythema, or tenderness Neurologic: Grossly intact, normal gait Psychiatric: mood appropriate, affect full  US PELVIS TRANSVAGINAL NON-OB (TV ONLY)  Result Date: 07/16/2020 Patient Name: Elizabeth Good DOB: [birth year] MRN: [id number] ULTRASOUND REPORT Location: [facility] Date of Service: 07/16/2020 Indications:Postmenopausal bleeding Findings: The uterus is anteverted and measures 7.3 x 5.1 x 4.1 cm. Echo texture is heterogenous without evidence of focal masses. The Endometrium measures 2.1 mm. Right Ovary measures 2.5 x 1.4 x 1.3 cm. There are microcalcifications in the right ovary. Left Oophorectomy. Survey of the adnexa demonstrates no adnexal masses. There is no free fluid in the cul de sac. Impression: 1. The endometrium is thin and ill-defined. 2. The uterus is heterogeneous. 3. The right ovary appears to have microcalcifications within. 4. Left Oophorectomy. Recommendations: 1.Clinical correlation with the patient's History and Physical Exam. Gweneth Dimitri, RT Images reviewed.  Normal GYN study without visualized pathology.  Malachy Mood, MD, [facility], [facility] 07/16/2020, 3:21 PM     Assessment: 54 y.o. G2P2 follow up US PMB  Plan: Problem List Items Addressed This Visit    None    Visit Diagnoses    PMB  (postmenopausal bleeding)    -  Primary      1) PMB - second episode in the past 12 months.  Last work up also normal endometrial stripe but was notable for adnexal mass.  Pap smear up to date 11/25/2017 NILM HPV negative.  If continued episodes discussed next step would be endometrial biopsy which may be limited given history of prior endometrial ablation.    2) A total of 15 minutes were spent in face-to-face contact with the patient during this encounter with over half of that time devoted to counseling and coordination of care.  3) Return in about 1 year (around 07/16/2021) for annual.     Malachy Mood, MD, [place], [place]

## 2020-07-16 NOTE — Progress Notes (Signed)
PT HERE FOR A CONSULT ON [place].  FLU GIVEN 06/25/20.

## 2020-08-19 ENCOUNTER — Ambulatory Visit: Payer: 59 | Attending: Internal Medicine

## 2020-08-19 DIAGNOSIS — Z23 Encounter for immunization: Secondary | ICD-10-CM

## 2020-08-19 NOTE — Progress Notes (Signed)
° °  Covid-19 Vaccination Clinic  Name:  Korrin Waterfield    MRN: [id number] DOB: 1966/04/18  08/19/2020  Ms. Marzec was observed post Covid-19 immunization for 15 minutes without incident. She was provided with Vaccine Information Sheet and instruction to access the V-Safe system.   Ms. Beegle was instructed to call 911 with any severe reactions post vaccine:  Difficulty breathing   Swelling of face and throat   A fast heartbeat   A bad rash all over body   Dizziness and weakness   Immunizations Administered    Name Date Dose VIS Date Route   Pfizer COVID-19 Vaccine 08/19/2020  3:42 PM 0.3 mL 06/19/2020 Intramuscular   Manufacturer: [place]   Lot: [id number]   [place]: [address]-8

## 2020-10-08 ENCOUNTER — Other Ambulatory Visit: Payer: Self-pay | Admitting: Obstetrics and Gynecology

## 2020-10-08 MED ORDER — IBUPROFEN 600 MG PO TABS: 600.0000 mg | ORAL_TABLET | Freq: Four times a day (QID) | ORAL | 3 refills | Status: DC | PRN

## 2020-12-26 ENCOUNTER — Other Ambulatory Visit: Payer: Self-pay

## 2020-12-26 DIAGNOSIS — Z1211 Encounter for screening for malignant neoplasm of colon: Secondary | ICD-10-CM

## 2020-12-26 MED FILL — Ibuprofen Tab 600 MG: ORAL | 15 days supply | Qty: 60 | Fill #0 | Status: AC

## 2021-01-06 ENCOUNTER — Other Ambulatory Visit: Payer: Self-pay

## 2021-01-06 MED FILL — Triamterene & Hydrochlorothiazide Cap 37.5-25 MG: ORAL | 90 days supply | Qty: 90 | Fill #0 | Status: AC

## 2021-01-06 MED FILL — Lamotrigine Tab ER 24HR 200 MG: ORAL | 90 days supply | Qty: 180 | Fill #0 | Status: CN

## 2021-01-06 MED FILL — Dexlansoprazole Cap Delayed Release 60 MG: ORAL | 90 days supply | Qty: 90 | Fill #0 | Status: AC

## 2021-01-07 ENCOUNTER — Other Ambulatory Visit: Payer: Self-pay

## 2021-01-07 MED FILL — Lamotrigine Tab ER 24HR 200 MG: ORAL | 90 days supply | Qty: 180 | Fill #0 | Status: AC

## 2021-01-08 ENCOUNTER — Other Ambulatory Visit: Payer: Self-pay

## 2021-01-24 ENCOUNTER — Encounter: Payer: Self-pay | Admitting: Gastroenterology

## 2021-01-31 DIAGNOSIS — Z1239 Encounter for other screening for malignant neoplasm of breast: Secondary | ICD-10-CM | POA: Diagnosis not present

## 2021-01-31 DIAGNOSIS — Z87898 Personal history of other specified conditions: Secondary | ICD-10-CM | POA: Diagnosis not present

## 2021-02-04 ENCOUNTER — Other Ambulatory Visit: Payer: Self-pay

## 2021-02-04 MED ORDER — SUTAB 1479-225-188 MG PO TABS
376.0000 mg | ORAL_TABLET | ORAL | 0 refills | Status: DC
  Filled 2021-02-04: qty 24, 1d supply, fill #0

## 2021-02-05 ENCOUNTER — Other Ambulatory Visit: Payer: Self-pay

## 2021-02-06 NOTE — Anesthesia Preprocedure Evaluation (Addendum)
Anesthesia Evaluation  Patient identified by MRN, date of birth, ID band Patient awake    Reviewed: NPO status   History of Anesthesia Complications (+) PONV and history of anesthetic complications  Airway Mallampati: II  TM Distance: >3 FB Neck ROM: full    Dental no notable dental hx.    Pulmonary neg pulmonary ROS,    Pulmonary exam normal        Cardiovascular Exercise Tolerance: Good hypertension, Normal cardiovascular exam     Neuro/Psych Seizures - (last 24 years ago),  negative neurological ROS  negative psych ROS   GI/Hepatic negative GI ROS, Neg liver ROS, GERD  Controlled,S/p gastric bypass   Endo/Other  Morbid obesity (bmi 33)  Renal/GU negative Renal ROS  negative genitourinary   Musculoskeletal   Abdominal   Peds  Hematology  (+) Blood dyscrasia, anemia ,   Anesthesia Other Findings Echo: 2018: Left ventricle: The cavity size was normal. Wall thickness was  normal. Systolic function was normal. The estimated ejection  fraction was in the range of 55% to 65%. ;    Reproductive/Obstetrics                            Anesthesia Physical Anesthesia Plan  ASA: 2  Anesthesia Plan: General   Post-op Pain Management:    Induction:   PONV Risk Score and Plan: 4 or greater and Propofol infusion, TIVA, Ondansetron and Treatment may vary due to age or medical condition  Airway Management Planned:   Additional Equipment:   Intra-op Plan:   Post-operative Plan:   Informed Consent: I have reviewed the patients History and Physical, chart, labs and discussed the procedure including the risks, benefits and alternatives for the proposed anesthesia with the patient or authorized representative who has indicated his/her understanding and acceptance.       Plan Discussed with: CRNA  Anesthesia Plan Comments:         Anesthesia Quick Evaluation

## 2021-02-07 ENCOUNTER — Other Ambulatory Visit: Payer: Self-pay

## 2021-02-07 ENCOUNTER — Ambulatory Visit: Payer: 59 | Admitting: Anesthesiology

## 2021-02-07 ENCOUNTER — Encounter: Payer: Self-pay | Admitting: Gastroenterology

## 2021-02-07 ENCOUNTER — Ambulatory Visit
Admission: RE | Admit: 2021-02-07 | Discharge: 2021-02-07 | Disposition: A | Discharge status: Home / Self Care | Payer: 59 | Attending: Gastroenterology | Admitting: Gastroenterology

## 2021-02-07 ENCOUNTER — Encounter
Admission: RE | Disposition: A | Discharge status: Home / Self Care | Payer: Self-pay | Source: Home / Self Care | Attending: Gastroenterology

## 2021-02-07 DIAGNOSIS — Z79899 Other long term (current) drug therapy: Secondary | ICD-10-CM | POA: Insufficient documentation

## 2021-02-07 DIAGNOSIS — Z1211 Encounter for screening for malignant neoplasm of colon: Secondary | ICD-10-CM | POA: Insufficient documentation

## 2021-02-07 DIAGNOSIS — D123 Benign neoplasm of transverse colon: Secondary | ICD-10-CM | POA: Insufficient documentation

## 2021-02-07 DIAGNOSIS — Z882 Allergy status to sulfonamides status: Secondary | ICD-10-CM | POA: Diagnosis not present

## 2021-02-07 DIAGNOSIS — Z91040 Latex allergy status: Secondary | ICD-10-CM | POA: Diagnosis not present

## 2021-02-07 DIAGNOSIS — Z888 Allergy status to other drugs, medicaments and biological substances status: Secondary | ICD-10-CM | POA: Diagnosis not present

## 2021-02-07 DIAGNOSIS — K635 Polyp of colon: Secondary | ICD-10-CM | POA: Diagnosis not present

## 2021-02-07 DIAGNOSIS — Z880 Allergy status to penicillin: Secondary | ICD-10-CM | POA: Diagnosis not present

## 2021-02-07 DIAGNOSIS — K640 First degree hemorrhoids: Secondary | ICD-10-CM | POA: Insufficient documentation

## 2021-02-07 DIAGNOSIS — Z9884 Bariatric surgery status: Secondary | ICD-10-CM | POA: Diagnosis not present

## 2021-02-07 DIAGNOSIS — D126 Benign neoplasm of colon, unspecified: Secondary | ICD-10-CM | POA: Diagnosis not present

## 2021-02-07 HISTORY — PX: POLYPECTOMY: SHX5525

## 2021-02-07 HISTORY — PX: COLONOSCOPY WITH PROPOFOL: SHX5780

## 2021-02-07 SURGERY — COLONOSCOPY WITH PROPOFOL
Anesthesia: General | Site: Rectum

## 2021-02-07 MED ORDER — STERILE WATER FOR IRRIGATION IR SOLN: Status: DC | PRN

## 2021-02-07 MED ORDER — LIDOCAINE HCL (CARDIAC) PF 100 MG/5ML IV SOSY
PREFILLED_SYRINGE | INTRAVENOUS | Status: DC | PRN
  Administered 2021-02-07: 30 mg via INTRAVENOUS

## 2021-02-07 MED ORDER — OXYCODONE HCL 5 MG/5ML PO SOLN: 5.0000 mg | Freq: Once | ORAL | Status: DC | PRN

## 2021-02-07 MED ORDER — PROPOFOL 10 MG/ML IV BOLUS
INTRAVENOUS | Status: DC | PRN
  Administered 2021-02-07 (×2): 40 mg via INTRAVENOUS
  Administered 2021-02-07: 150 mg via INTRAVENOUS
  Administered 2021-02-07 (×3): 40 mg via INTRAVENOUS

## 2021-02-07 MED ORDER — LACTATED RINGERS IV SOLN: INTRAVENOUS | Status: DC

## 2021-02-07 MED ORDER — OXYCODONE HCL 5 MG PO TABS: 5.0000 mg | ORAL_TABLET | Freq: Once | ORAL | Status: DC | PRN

## 2021-02-07 SURGICAL SUPPLY — 8 items
GOWN CVR UNV OPN BCK APRN NK (MISCELLANEOUS) ×2 IMPLANT
GOWN ISOL THUMB LOOP REG UNIV (MISCELLANEOUS) ×4
KIT PRC NS LF DISP ENDO (KITS) ×1 IMPLANT
KIT PROCEDURE OLYMPUS (KITS) ×2
MANIFOLD NEPTUNE II (INSTRUMENTS) ×2 IMPLANT
SNARE COLD EXACTO (MISCELLANEOUS) ×2 IMPLANT
TRAP ETRAP POLY (MISCELLANEOUS) ×2 IMPLANT
WATER STERILE IRR 250ML POUR (IV SOLUTION) ×2 IMPLANT

## 2021-02-07 NOTE — Anesthesia Postprocedure Evaluation (Signed)
Anesthesia Post Note  Patient: Elizabeth Good  Procedure(s) Performed: COLONOSCOPY WITH BIOPSY (Rectum) POLYPECTOMY (Rectum)     Patient location during evaluation: PACU Anesthesia Type: General Level of consciousness: awake and alert Pain management: pain level controlled Vital Signs Assessment: post-procedure vital signs reviewed and stable Respiratory status: spontaneous breathing, nonlabored ventilation, respiratory function stable and patient connected to nasal cannula oxygen Cardiovascular status: blood pressure returned to baseline and stable Postop Assessment: no apparent nausea or vomiting Anesthetic complications: no   No notable events documented.  Fidel Levy

## 2021-02-07 NOTE — Op Note (Signed)
[facility]astroenter[facility] Patient Name: Elizabeth Good Procedure Date: 02/07/2021 7:18 AM MRN: [id number] Account #: [id number] Date of Birth: 1965/09/05 Admit Type: Outpatient Age: 55 Room: [facility] OR ROOM 01 Gender: Female Note Status: Finalized Procedure:             Colonoscopy Indications:           Screening for colorectal malignant neoplasm Providers:             Lucilla Lame MD, MD Referring MD:          Gayland Curry MD, MD (Referring MD) Medicines:             Propofol per Anesthesia Complications:         No immediate complications. Procedure:             Pre-Anesthesia Assessment:                        - Prior to the procedure, a History and Physical was                         performed, and patient medications and allergies were                         reviewed. The patient's tolerance of previous                         anesthesia was also reviewed. The risks and benefits                         of the procedure and the sedation options and risks                         were discussed with the patient. All questions were                         answered, and informed consent was obtained. Prior                         Anticoagulants: The patient has taken no previous                         anticoagulant or antiplatelet agents. ASA Grade                         Assessment: II - A patient with mild systemic disease.                         After reviewing the risks and benefits, the patient                         was deemed in satisfactory condition to undergo the                         procedure.                        After obtaining informed consent, the colonoscope was  passed under direct vision. Throughout the procedure,                         the patient's blood pressure, pulse, and oxygen                         saturations were monitored continuously. The                         Colonoscope was introduced through the  anus and                         advanced to the the cecum, identified by appendiceal                         orifice and ileocecal valve. The colonoscopy was                         performed without difficulty. The patient tolerated                         the procedure well. The quality of the bowel                         preparation was excellent. Findings:      The perianal and digital rectal examinations were normal.      A 5 mm polyp was found in the transverse colon. The polyp was sessile.       The polyp was removed with a cold snare. Resection and retrieval were       complete.      A 4 mm polyp was found in the sigmoid colon. The polyp was sessile. The       polyp was removed with a cold snare. Resection and retrieval were       complete.      Non-bleeding internal hemorrhoids were found. The hemorrhoids were Grade       I (internal hemorrhoids that do not prolapse). Impression:            - One 5 mm polyp in the transverse colon, removed with                         a cold snare. Resected and retrieved.                        - One 4 mm polyp in the sigmoid colon, removed with a                         cold snare. Resected and retrieved.                        - Non-bleeding internal hemorrhoids. Recommendation:        - Discharge patient to home.                        - Resume previous diet.                        - Continue present medications.                        -  Await pathology results.                        - Repeat colonoscopy in 7 years for surveillance if                         adenomatous and 10 years if hyperplastic. Procedure Code(s):     --- Professional ---                        [phone], Colonoscopy, flexible; with removal of                         tumor(s), polyp(s), or other lesion(s) by snare                         technique Diagnosis Code(s):     --- Professional ---                        Z12.11, Encounter for screening for malignant neoplasm                          of colon                        K63.5, Polyp of colon CPT copyright 2019 American Medical Association. All rights reserved. The codes documented in this report are preliminary and upon coder review may  be revised to meet current compliance requirements. Lucilla Lame MD, MD 02/07/2021 7:56:09 AM This report has been signed electronically. Number of Addenda: 0 Note Initiated On: 02/07/2021 7:18 AM Scope Withdrawal Time: 0 hours 7 minutes 24 seconds  Total Procedure Duration: 0 hours 18 minutes 53 seconds  Estimated Blood Loss:  Estimated blood loss: none.      [facility]

## 2021-02-07 NOTE — H&P (Signed)
Lucilla Lame, MD [address] [place], [place] [address] Phone: [phone] Fax : [phone]  Primary Care Physician:  Gayland Curry, MD Primary Gastroenterologist:  Dr. Allen Norris  Pre-Procedure History & Physical: HPI:  Elizabeth Good is a 55 y.o. female is here for a screening colonoscopy.   Past Medical History:  Diagnosis Date   Allergy    Anemia    Bartholin cyst 1990   Basal cell carcinoma    Curvature of spine    Family history of adverse reaction to anesthesia    Mother - PONV   GERD (gastroesophageal reflux disease)    High cholesterol    History of bladder infections    History of hiatal hernia    History of kidney infection    History of kidney stones    h/o   Hypertension    Kidney stones    Low serum iron    Meningitis    Age 6   PONV (postoperative nausea and vomiting)    hard to wake up   PVC's (premature ventricular contractions)    with coffee   Renal disorder    Scoliosis    Seizures ([place])    none since 05/1997.  on medication   Wears contact lenses     Past Surgical History:  Procedure Laterality Date   ANTERIOR AND POSTERIOR VAGINAL REPAIR     [facility]   BIOPSY N/A 08/08/2019   Procedure: UTERINE SEROSA BIOPSIES;  Surgeon: Malachy Mood, MD;  Location: [facility] ORS;  Service: Gynecology;  Laterality: N/A;   BLADDER SURGERY     BREAST BIOPSY Left 06/29/2019   left breast stereo bx/ x clip/FOCAL ATYPICAL DUCT HYPERPLASIA WITH MICROCALCIFICATIONS.    BREAST BIOPSY Left 07/14/2019   left breast stereo bx/ coil clip/ path pending   CHOLECYSTECTOMY  2011   COMBINED HYSTEROSCOPY DIAGNOSTIC / D&C  12/06/2014   [facility]   ENDOMETRIAL ABLATION  2011   [facility]  Dr. Amalia Hailey   EXTRACORPOREAL SHOCK WAVE LITHOTRIPSY Right 08/25/2018   Procedure: EXTRACORPOREAL SHOCK WAVE LITHOTRIPSY (ESWL);  Surgeon: Billey Co, MD;  Location: [facility] ORS;  Service: Urology;  Laterality: Right;   FOOT SURGERY Left    GANGLION  CYST EXCISION     GASTRIC BYPASS  2011   INCONTINENCE SURGERY     LAPAROSCOPIC BILATERAL SALPINGECTOMY  08/08/2019   Procedure: LAPAROSCOPIC BILATERAL SALPINGECTOMY;  Surgeon: Malachy Mood, MD;  Location: [facility] ORS;  Service: Gynecology;;   LITHOTRIPSY  2016   OOPHORECTOMY Left 2020   RECTAL SURGERY     SEPTOPLASTY     TONSILLECTOMY      Prior to Admission medications   Medication Sig Start Date End Date Taking? Authorizing Provider  cetirizine (ZYRTEC) 10 MG tablet Take 10 mg by mouth at bedtime.    Yes [provider]  dexlansoprazole (DEXILANT) 60 MG capsule Take 60 mg by mouth at bedtime.    Yes [provider]  ibuprofen (ADVIL) 600 MG tablet TAKE 1 TABLET (600 MG TOTAL) BY MOUTH EVERY 6 (SIX) HOURS AS NEEDED. 10/08/20 10/08/21 Yes Malachy Mood, MD  LamoTRIgine 200 MG TB24 24 hour tablet TAKE 1 TABLET (200 MG TOTAL) BY MOUTH 2 (TWO) TIMES DAILY (USE NDC [address]-10 ONLY ) 03/25/20 04/08/21 Yes Gayland Curry, MD  Multiple Vitamin (MULTIVITAMIN) tablet Take 1 tablet by mouth daily.   Yes [provider]  Sodium Sulfate-Mag Sulfate-KCl (SUTAB) [phone] MG TABS Take 376 mg by mouth as directed. 02/04/21  Yes Nainoa Woldt,  Albena Comes, MD  triamterene-hydrochlorothiazide (DYAZIDE) 37.5-25 MG capsule TAKE 1 CAPSULE BY MOUTH EVERY MORNING 03/08/20 04/08/21 Yes Gayland Curry, MD  Cholecalciferol (VITAMIN D3) 5000 units TABS Take 5,000 Units by mouth every 7 (seven) days.  Patient not taking: Reported on 01/24/2021 10/12/11   [provider]  dexlansoprazole ([place]) 60 MG capsule TAKE 1 CAPSULE (60 MG TOTAL) BY MOUTH ONCE DAILY Patient not taking: Reported on 01/24/2021 03/25/20 04/08/21  Gayland Curry, MD    Allergies as of 12/26/2020 - Review Complete 07/16/2020  Allergen Reaction Noted   Dermabase [albolene]  07/16/2020   Latex Rash 07/19/2015   Penicillins Rash 07/19/2015   Sulfa antibiotics Rash 07/19/2015    Family History  Problem Relation Age  of Onset   Bladder Cancer Mother    Prostate cancer Father    Melanoma Father    Breast cancer Cousin     Social History   Socioeconomic History   Marital status: Divorced    Spouse name: Not on file   Number of children: Not on file   Years of education: Not on file   Highest education level: Not on file  Occupational History   Not on file  Tobacco Use   Smoking status: Never   Smokeless tobacco: Never  Vaping Use   Vaping Use: Never used  Substance and Sexual Activity   Alcohol use: Yes    Alcohol/week: 0.0 standard drinks    Comment: social - 1 glass wine/month   Drug use: No   Sexual activity: Yes    Birth control/protection: Pill  Other Topics Concern   Not on file  Social History Narrative   Not on file   Social Determinants of Health   Financial Resource Strain: Not on file  Food Insecurity: Not on file  Transportation Needs: Not on file  Physical Activity: Not on file  Stress: Not on file  Social Connections: Not on file  Intimate Partner Violence: Not on file    Review of Systems: See HPI, otherwise negative ROS  Physical Exam: BP 126/84   Pulse 83   Temp (!) 97.5 F (36.4 C) (Temporal)   Resp 18   Ht 5\' 5"  (1.651 m)   Wt 89.4 kg   LMP 11/29/2017 (Approximate)   SpO2 99%   BMI 32.78 kg/m  General:   Alert,  pleasant and cooperative in NAD Head:  Normocephalic and atraumatic. Neck:  Supple; no masses or thyromegaly. Lungs:  Clear throughout to auscultation.    Heart:  Regular rate and rhythm. Abdomen:  Soft, nontender and nondistended. Normal bowel sounds, without guarding, and without rebound.   Neurologic:  Alert and  oriented x4;  grossly normal neurologically.  Impression/Plan: Elizabeth Good is now here to undergo a screening colonoscopy.  Risks, benefits, and alternatives regarding colonoscopy have been reviewed with the patient.  Questions have been answered.  All parties agreeable.

## 2021-02-07 NOTE — Transfer of Care (Signed)
Immediate Anesthesia Transfer of Care Note  Patient: Elizabeth Good  Procedure(s) Performed: COLONOSCOPY WITH BIOPSY (Rectum) POLYPECTOMY (Rectum)  Patient Location: PACU  Anesthesia Type: General  Level of Consciousness: awake, alert  and patient cooperative  Airway and Oxygen Therapy: Patient Spontanous Breathing and Patient connected to supplemental oxygen  Post-op Assessment: Post-op Vital signs reviewed, Patient's Cardiovascular Status Stable, Respiratory Function Stable, Patent Airway and No signs of Nausea or vomiting  Post-op Vital Signs: Reviewed and stable  Complications: No notable events documented.

## 2021-02-07 NOTE — Anesthesia Procedure Notes (Signed)
Date/Time: 02/07/2021 7:29 AM Performed by: Cameron Ali, CRNA Pre-anesthesia Checklist: Patient identified, Emergency Drugs available, Suction available, Timeout performed and Patient being monitored Patient Re-evaluated:Patient Re-evaluated prior to induction Oxygen Delivery Method: Nasal cannula Placement Confirmation: positive ETCO2

## 2021-02-10 ENCOUNTER — Encounter: Payer: Self-pay | Admitting: Gastroenterology

## 2021-02-10 LAB — SURGICAL PATHOLOGY

## 2021-04-07 ENCOUNTER — Other Ambulatory Visit: Payer: Self-pay

## 2021-04-07 DIAGNOSIS — Z0000 Encounter for general adult medical examination without abnormal findings: Secondary | ICD-10-CM | POA: Diagnosis not present

## 2021-04-07 DIAGNOSIS — I10 Essential (primary) hypertension: Secondary | ICD-10-CM | POA: Diagnosis not present

## 2021-04-07 DIAGNOSIS — Z9884 Bariatric surgery status: Secondary | ICD-10-CM | POA: Diagnosis not present

## 2021-04-07 DIAGNOSIS — Z1231 Encounter for screening mammogram for malignant neoplasm of breast: Secondary | ICD-10-CM | POA: Diagnosis not present

## 2021-04-07 DIAGNOSIS — J0100 Acute maxillary sinusitis, unspecified: Secondary | ICD-10-CM | POA: Diagnosis not present

## 2021-04-07 DIAGNOSIS — R569 Unspecified convulsions: Secondary | ICD-10-CM | POA: Diagnosis not present

## 2021-04-07 DIAGNOSIS — R6889 Other general symptoms and signs: Secondary | ICD-10-CM | POA: Diagnosis not present

## 2021-04-07 MED ORDER — DOXYCYCLINE HYCLATE 100 MG PO TABS
ORAL_TABLET | ORAL | 0 refills | Status: DC
  Filled 2021-04-07: qty 20, 10d supply, fill #0

## 2021-04-07 MED ORDER — LAMOTRIGINE ER 200 MG PO TB24
1.0000 | ORAL_TABLET | Freq: Two times a day (BID) | ORAL | 3 refills | Status: DC
  Filled 2021-04-07: qty 180, 90d supply, fill #0

## 2021-04-07 MED ORDER — TRIAMTERENE-HCTZ 37.5-25 MG PO CAPS
1.0000 | ORAL_CAPSULE | Freq: Every morning | ORAL | 3 refills | Status: DC
  Filled 2021-04-07: qty 90, 90d supply, fill #0
  Filled 2021-07-07: qty 90, 90d supply, fill #1
  Filled 2021-10-03: qty 30, 30d supply, fill #2
  Filled 2021-11-27: qty 90, 90d supply, fill #3

## 2021-04-07 MED ORDER — FLUCONAZOLE 150 MG PO TABS
150.0000 mg | ORAL_TABLET | Freq: Once | ORAL | 0 refills | Status: AC
  Filled 2021-04-07: qty 1, 1d supply, fill #0

## 2021-04-07 MED ORDER — LAMOTRIGINE ER 200 MG PO TB24
ORAL_TABLET | ORAL | 3 refills | Status: DC
  Filled 2021-04-07: qty 180, 90d supply, fill #0
  Filled 2021-07-07: qty 180, 90d supply, fill #1
  Filled 2021-10-03: qty 180, 90d supply, fill #2
  Filled 2021-11-27 – 2022-01-29 (×2): qty 180, 90d supply, fill #3

## 2021-04-07 MED FILL — Ibuprofen Tab 600 MG: ORAL | 15 days supply | Qty: 60 | Fill #1 | Status: AC

## 2021-04-08 ENCOUNTER — Other Ambulatory Visit: Payer: Self-pay

## 2021-04-08 MED ORDER — DEXLANSOPRAZOLE 60 MG PO CPDR
DELAYED_RELEASE_CAPSULE | ORAL | 3 refills | Status: DC
  Filled 2021-04-08: qty 90, 90d supply, fill #0
  Filled 2021-06-30: qty 30, 30d supply, fill #1
  Filled 2021-07-07: qty 90, 90d supply, fill #1

## 2021-04-09 ENCOUNTER — Other Ambulatory Visit: Payer: Self-pay

## 2021-04-15 ENCOUNTER — Other Ambulatory Visit: Payer: Self-pay

## 2021-04-15 DIAGNOSIS — D509 Iron deficiency anemia, unspecified: Secondary | ICD-10-CM

## 2021-04-16 ENCOUNTER — Other Ambulatory Visit: Payer: Self-pay | Admitting: Family Medicine

## 2021-04-16 DIAGNOSIS — Z9884 Bariatric surgery status: Secondary | ICD-10-CM

## 2021-04-16 DIAGNOSIS — Z1231 Encounter for screening mammogram for malignant neoplasm of breast: Secondary | ICD-10-CM

## 2021-04-29 ENCOUNTER — Ambulatory Visit: Payer: 59

## 2021-04-29 ENCOUNTER — Ambulatory Visit: Payer: 59 | Admitting: Oncology

## 2021-04-29 ENCOUNTER — Other Ambulatory Visit: Payer: 59

## 2021-05-01 ENCOUNTER — Other Ambulatory Visit: Payer: Self-pay | Admitting: Oncology

## 2021-05-02 ENCOUNTER — Other Ambulatory Visit: Payer: Self-pay

## 2021-05-06 ENCOUNTER — Other Ambulatory Visit: Payer: Self-pay

## 2021-05-08 ENCOUNTER — Encounter: Payer: Self-pay | Admitting: Oncology

## 2021-05-08 ENCOUNTER — Inpatient Hospital Stay: Payer: 59

## 2021-05-08 ENCOUNTER — Other Ambulatory Visit: Payer: Self-pay

## 2021-05-08 ENCOUNTER — Inpatient Hospital Stay: Payer: 59 | Admitting: Oncology

## 2021-05-08 ENCOUNTER — Inpatient Hospital Stay: Payer: 59 | Attending: Oncology

## 2021-05-08 VITALS — BP 125/79 | HR 91 | Temp 98.0°F | Resp 16 | Wt 202.5 lb

## 2021-05-08 DIAGNOSIS — Z9884 Bariatric surgery status: Secondary | ICD-10-CM | POA: Diagnosis not present

## 2021-05-08 DIAGNOSIS — D509 Iron deficiency anemia, unspecified: Secondary | ICD-10-CM

## 2021-05-08 DIAGNOSIS — D508 Other iron deficiency anemias: Secondary | ICD-10-CM | POA: Diagnosis not present

## 2021-05-08 LAB — CBC WITH DIFFERENTIAL/PLATELET
Abs Immature Granulocytes: 0.02 10*3/uL (ref 0.00–0.07)
Basophils Absolute: 0.1 10*3/uL (ref 0.0–0.1)
Basophils Relative: 1 %
Eosinophils Absolute: 0.3 10*3/uL (ref 0.0–0.5)
Eosinophils Relative: 3 %
HCT: 33 % — ABNORMAL LOW (ref 36.0–46.0)
Hemoglobin: 10.7 g/dL — ABNORMAL LOW (ref 12.0–15.0)
Immature Granulocytes: 0 %
Lymphocytes Relative: 27 %
Lymphs Abs: 2.2 10*3/uL (ref 0.7–4.0)
MCH: 24.9 pg — ABNORMAL LOW (ref 26.0–34.0)
MCHC: 32.4 g/dL (ref 30.0–36.0)
MCV: 76.9 fL — ABNORMAL LOW (ref 80.0–100.0)
Monocytes Absolute: 0.6 10*3/uL (ref 0.1–1.0)
Monocytes Relative: 8 %
Neutro Abs: 4.9 10*3/uL (ref 1.7–7.7)
Neutrophils Relative %: 61 %
Platelets: 400 10*3/uL (ref 150–400)
RBC: 4.29 MIL/uL (ref 3.87–5.11)
RDW: 15.6 % — ABNORMAL HIGH (ref 11.5–15.5)
WBC: 8.1 10*3/uL (ref 4.0–10.5)
nRBC: 0 % (ref 0.0–0.2)

## 2021-05-08 LAB — RETIC PANEL
Immature Retic Fract: 8.2 % (ref 2.3–15.9)
RBC.: 4.2 MIL/uL (ref 3.87–5.11)
Retic Count, Absolute: 39.1 10*3/uL (ref 19.0–186.0)
Retic Ct Pct: 0.9 % (ref 0.4–3.1)
Reticulocyte Hemoglobin: 25.4 pg — ABNORMAL LOW (ref >27.9)

## 2021-05-08 MED ORDER — SODIUM CHLORIDE 0.9 % IV SOLN: 200.0000 mg | Freq: Once | INTRAVENOUS | Status: DC

## 2021-05-08 MED ORDER — PROMETHAZINE HCL 25 MG PO TABS: 25.0000 mg | ORAL_TABLET | Freq: Four times a day (QID) | ORAL | 0 refills | Status: DC | PRN

## 2021-05-08 MED ORDER — IRON SUCROSE 20 MG/ML IV SOLN
200.0000 mg | Freq: Once | INTRAVENOUS | Status: AC
  Administered 2021-05-08: 200 mg via INTRAVENOUS
  Filled 2021-05-08: qty 10

## 2021-05-08 MED ORDER — SODIUM CHLORIDE 0.9 % IV SOLN
Freq: Once | INTRAVENOUS | Status: AC
  Filled 2021-05-08: qty 250

## 2021-05-08 NOTE — Progress Notes (Signed)
[facility] Follow Up Note:  Patient Care Team: Earlie Server, MD as PCP - General (Oncology)  REASON FOR VISIT Follow up for treatment of anemia.   HISTORY OF PRESENTING ILLNESS: Elizabeth Good 55 y.o. female with PMH list as below is referred by Dr.Aldridge for evaluation and management of iron deficiency anemia.  She has a history of iron deficiency anemia and history of gastric bypass in 2011. She has been taken oral iron supplementation ferrous sulfate until recently she stopped taking due to GI side effects. She reports feeling fatigued all the time. Her last menstrual period was in April. It was usually having. She has a referral to obtain screening colonoscopy. Labs done on 03/08/2017 at [facility] showed ferritin of 6, transferrin saturation 8   S/p IV iron with Feraheme 510 mg IV for 2 doses . Had unexplained 10 pounds weight gain, bloating. Normal TSH, normal 2D echo   INTERVAL HISTORY Patient present for follow up visit of iron deficiency anemia.  Patient was last seen on 05/11/2018 for iron deficiency anemia.  She has previously received IV Feraheme treatments. Patient lost follow-up since then. Patient reports feeling extremely tired and fatigued for the past 6 weeks. She donated blood prior to the onset of symptoms. Denies any bloody or black bowel movement.  Abdominal discomfort or pain. Patient had colonoscopy done in June 2022 by Dr. Verl Blalock.    Review of Systems  Constitutional:  Positive for fatigue. Negative for appetite change and chills.  HENT:   Negative for hearing loss.   Eyes:  Negative for eye problems.  Respiratory:  Negative for chest tightness.   Cardiovascular:  Negative for chest pain.  Gastrointestinal:  Negative for abdominal distention.  Endocrine: Negative for hot flashes.  Genitourinary:  Negative for difficulty urinating.   Musculoskeletal:  Negative for arthralgias.  Skin:  Negative for itching.  Neurological:   Negative for dizziness.  Hematological:  Negative for adenopathy.  Psychiatric/Behavioral:  The patient is not nervous/anxious.    MEDICAL HISTORY: Past Medical History:  Diagnosis Date   Allergy    Anemia    Bartholin cyst 1990   Basal cell carcinoma    Curvature of spine    Family history of adverse reaction to anesthesia    Mother - PONV   GERD (gastroesophageal reflux disease)    High cholesterol    History of bladder infections    History of hiatal hernia    History of kidney infection    History of kidney stones    h/o   Hypertension    Kidney stones    Low serum iron    Meningitis    Age 49   PONV (postoperative nausea and vomiting)    hard to wake up   PVC's (premature ventricular contractions)    with coffee   Renal disorder    Scoliosis    Seizures ([place])    none since 05/1997.  on medication   Wears contact lenses     SURGICAL HISTORY: Past Surgical History:  Procedure Laterality Date   ANTERIOR AND POSTERIOR VAGINAL REPAIR     [facility]   BIOPSY N/A 08/08/2019   Procedure: UTERINE SEROSA BIOPSIES;  Surgeon: Malachy Mood, MD;  Location: [facility];  Service: Gynecology;  Laterality: N/A;   BLADDER SURGERY     BREAST BIOPSY Left 06/29/2019   left breast stereo bx/ x clip/FOCAL ATYPICAL DUCT HYPERPLASIA WITH MICROCALCIFICATIONS.    BREAST BIOPSY Left 07/14/2019  left breast stereo bx/ coil clip/ path pending   CHOLECYSTECTOMY  2011   COLONOSCOPY WITH PROPOFOL N/A 02/07/2021   Procedure: COLONOSCOPY WITH BIOPSY;  Surgeon: Lucilla Lame, MD;  Location: [place];  Service: Endoscopy;  Laterality: N/A;   COMBINED HYSTEROSCOPY DIAGNOSTIC / D&C  12/06/2014   [facility]   ENDOMETRIAL ABLATION  2011   [facility]  Dr. Amalia Hailey   EXTRACORPOREAL SHOCK WAVE LITHOTRIPSY Right 08/25/2018   Procedure: EXTRACORPOREAL SHOCK WAVE LITHOTRIPSY (ESWL);  Surgeon: Billey Co, MD;  Location: [facility];  Service: Urology;  Laterality: Right;   FOOT  SURGERY Left    GANGLION CYST EXCISION     GASTRIC BYPASS  2011   INCONTINENCE SURGERY     LAPAROSCOPIC BILATERAL SALPINGECTOMY  08/08/2019   Procedure: LAPAROSCOPIC BILATERAL SALPINGECTOMY;  Surgeon: Malachy Mood, MD;  Location: [facility];  Service: Gynecology;;   LITHOTRIPSY  2016   OOPHORECTOMY Left 2020   POLYPECTOMY N/A 02/07/2021   Procedure: POLYPECTOMY;  Surgeon: Lucilla Lame, MD;  Location: [place];  Service: Endoscopy;  Laterality: N/A;   RECTAL SURGERY     SEPTOPLASTY     TONSILLECTOMY      SOCIAL HISTORY: Social History   Socioeconomic History   Marital status: Divorced    Spouse name: Not on file   Number of children: Not on file   Years of education: Not on file   Highest education level: Not on file  Occupational History   Not on file  Tobacco Use   Smoking status: Never   Smokeless tobacco: Never  Vaping Use   Vaping Use: Never used  Substance and Sexual Activity   Alcohol use: Yes    Alcohol/week: 0.0 standard drinks    Comment: social - 1 glass wine/month   Drug use: No   Sexual activity: Yes    Birth control/protection: Pill  Other Topics Concern   Not on file  Social History Narrative   Not on file   Social Determinants of Health   Financial Resource Strain: Not on file  Food Insecurity: Not on file  Transportation Needs: Not on file  Physical Activity: Not on file  Stress: Not on file  Social Connections: Not on file  Intimate Partner Violence: Not on file    FAMILY HISTORY Family History  Problem Relation Age of Onset   Bladder Cancer Mother    Prostate cancer Father    Melanoma Father    Breast cancer Cousin     ALLERGIES:  is allergic to dermabase [albolene], other, latex, penicillins, and sulfa antibiotics.  MEDICATIONS:  Current Outpatient Medications  Medication Sig Dispense Refill   cetirizine (ZYRTEC) 10 MG tablet Take 10 mg by mouth at bedtime.      Cholecalciferol (VITAMIN D3) 5000 units TABS Take 5,000  Units by mouth every 7 (seven) days.     cyanocobalamin 1000 MCG tablet once a week.     dexlansoprazole (DEXILANT) 60 MG capsule Take 60 mg by mouth at bedtime.      ibuprofen (ADVIL) 600 MG tablet TAKE 1 TABLET (600 MG TOTAL) BY MOUTH EVERY 6 (SIX) HOURS AS NEEDED. 60 tablet 3   Multiple Vitamin (MULTIVITAMIN) tablet Take 1 tablet by mouth daily.     promethazine (PHENERGAN) 25 MG tablet Take 1 tablet (25 mg total) by mouth every 6 (six) hours as needed for nausea or vomiting. 20 tablet 0   Sodium Sulfate-Mag Sulfate-KCl (SUTAB) [phone] MG TABS Take 376 mg by mouth as directed. 24  tablet 0   triamterene-hydrochlorothiazide (DYAZIDE) 37.5-25 MG capsule Take 1 capsule by mouth every morning 90 capsule 3   dexlansoprazole (DEXILANT) 60 MG capsule TAKE 1 CAPSULE (60 MG TOTAL) BY MOUTH ONCE DAILY (Patient not taking: Reported on 01/24/2021) 90 capsule 3   dexlansoprazole (DEXILANT) 60 MG capsule Take 1 capsule (60 mg total) by mouth once daily as needed (GERD (wean down if possible to avoid long term side effects)) (Patient not taking: Reported on 05/08/2021) 90 capsule 3   LamoTRIgine 200 MG TB24 24 hour tablet One by mouth twice daily. Dr. Reece Levy brand only. 180 tablet 3   LamoTRIgine 200 MG TB24 24 hour tablet Take 1 tablet (200 mg total) by mouth 2 (two) times daily 180 tablet 3   tamsulosin (FLOMAX) 0.4 MG CAPS capsule  (Patient not taking: Reported on 05/08/2021)     No current facility-administered medications for this visit.    PHYSICAL EXAMINATION:  ECOG PERFORMANCE STATUS: 1 - Symptomatic but completely ambulatory  Vitals:   05/08/21 1318  BP: 125/79  Pulse: 91  Resp: 16  Temp: 98 F (36.7 C)  SpO2: 98%    Filed Weights   05/08/21 1318  Weight: 202 lb 7.9 oz (91.8 kg)     Physical Exam Constitutional:      General: She is not in acute distress.    Appearance: She is not diaphoretic.  HENT:     Head: Normocephalic and atraumatic.     Nose: Nose normal.      Mouth/Throat:     Pharynx: No oropharyngeal exudate.  Eyes:     General: No scleral icterus.       Left eye: No discharge.     Conjunctiva/sclera: Conjunctivae normal.     Pupils: Pupils are equal, round, and reactive to light.  Neck:     Vascular: No JVD.  Cardiovascular:     Rate and Rhythm: Normal rate and regular rhythm.     Heart sounds: Normal heart sounds. No murmur heard. Pulmonary:     Effort: Pulmonary effort is normal. No respiratory distress.     Breath sounds: Normal breath sounds. No rales.  Chest:     Chest wall: No tenderness.  Abdominal:     General: Bowel sounds are normal. There is no distension.     Palpations: Abdomen is soft. There is no mass.     Tenderness: There is no abdominal tenderness.  Musculoskeletal:        General: Normal range of motion.     Cervical back: Normal range of motion and neck supple.  Lymphadenopathy:     Cervical: No cervical adenopathy.  Skin:    General: Skin is warm and dry.     Findings: No erythema.  Neurological:     Mental Status: She is alert and oriented to person, place, and time.     Cranial Nerves: No cranial nerve deficit.     Motor: No abnormal muscle tone.     Coordination: Coordination normal.     Gait: Gait is intact.  Psychiatric:        Mood and Affect: Affect normal.      LABORATORY DATA: I have personally reviewed the data as listed: CBC    Component Value Date/Time   WBC 8.1 05/08/2021 1308   RBC 4.29 05/08/2021 1308   HGB 10.7 (L) 05/08/2021 1308   HGB 11.1 (L) 11/21/2014 1327   HCT 33.0 (L) 05/08/2021 1308   HCT 34.9 (L) 11/21/2014 1327  PLT 400 05/08/2021 1308   PLT 275 11/21/2014 1327   MCV 76.9 (L) 05/08/2021 1308   MCV 80 11/21/2014 1327   MCH 24.9 (L) 05/08/2021 1308   MCHC 32.4 05/08/2021 1308   RDW 15.6 (H) 05/08/2021 1308   RDW 14.5 11/21/2014 1327   LYMPHSABS 2.2 05/08/2021 1308   MONOABS 0.6 05/08/2021 1308   EOSABS 0.3 05/08/2021 1308   BASOSABS 0.1 05/08/2021 1308    Iron/TIBC/Ferritin/ %Sat    Component Value Date/Time   IRON 110 04/25/2018 1517   TIBC 375 04/25/2018 1517   FERRITIN 13 04/25/2018 1517   IRONPCTSAT 29 04/25/2018 1517   RADIOGRAPHIC STUDIES: I have personally reviewed the radiological images as listed and agree with the findings in the report Mammogram 03/06/2016 IMPRESSION: 1. 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 2.  No mammographic evidence of malignancy in the bilateral breasts.   ASSESSMENT/PLAN  1. Iron deficiency anemia, unspecified iron deficiency anemia type   2. Hx of gastric bypass    #Iron deficiency anemia, Reviewed her blood work at [facility] 04/07/2021, hemoglobin 12.2, hematocrit 37.8, MCV 78. Iron panel showed decreased transferrin saturation of 5, iron is 25, TIBC 455.  No ferritin was checked.  05/08/2021, hemoglobin 10.7, MCV 76.9. This is consistent with iron deficiency anemia.  Probably due to decreased absorption secondary to gastric bypass. Recommend IV Venofer treatments. Plan IV iron with Venofer '200mg'$  weekly x 3 doses. Allergy reactions/infusion reaction including anaphylactic reaction discussed with patient. Other side effects include but not limited to high blood pressure, skin rash, weight gain, leg swelling, etc. Patient voices understanding and willing to proceed.  #Gastric bypass, patient has had B12 and a folate level checked recently at [facility].  Both levels were normal.  Recommend patient to continue vitamin B12 1000 MCG daily.   Follow up in 3 months for labs NP +/- Venofer  Follow-up in 6 months lab MD +/- Venofer   All questions were answered. The patient knows to call the clinic with any problems, questions or concerns.  Earlie Server, MD, PhD Hematology Oncology [place] at [facility] 05/08/2021

## 2021-05-08 NOTE — Progress Notes (Signed)
Pt states for the past 6 weeks she has noticed being more tired and fatigued seems to be getting worse.

## 2021-05-09 ENCOUNTER — Inpatient Hospital Stay
Admission: RE | Admit: 2021-05-09 | Discharge: 2021-05-09 | Disposition: A | Discharge status: Home / Self Care | Payer: Self-pay | Source: Ambulatory Visit | Attending: *Deleted | Admitting: *Deleted

## 2021-05-09 ENCOUNTER — Other Ambulatory Visit: Payer: Self-pay | Admitting: *Deleted

## 2021-05-09 DIAGNOSIS — Z1231 Encounter for screening mammogram for malignant neoplasm of breast: Secondary | ICD-10-CM

## 2021-05-15 ENCOUNTER — Inpatient Hospital Stay: Payer: 59

## 2021-05-15 ENCOUNTER — Other Ambulatory Visit: Payer: Self-pay

## 2021-05-15 ENCOUNTER — Ambulatory Visit: Payer: 59

## 2021-05-15 VITALS — BP 111/70 | HR 88 | Resp 18

## 2021-05-15 DIAGNOSIS — D509 Iron deficiency anemia, unspecified: Secondary | ICD-10-CM

## 2021-05-15 DIAGNOSIS — D508 Other iron deficiency anemias: Secondary | ICD-10-CM | POA: Diagnosis not present

## 2021-05-15 DIAGNOSIS — Z9884 Bariatric surgery status: Secondary | ICD-10-CM | POA: Diagnosis not present

## 2021-05-15 MED ORDER — SODIUM CHLORIDE 0.9 % IV SOLN: 200.0000 mg | Freq: Once | INTRAVENOUS | Status: DC

## 2021-05-15 MED ORDER — SODIUM CHLORIDE 0.9 % IV SOLN
Freq: Once | INTRAVENOUS | Status: AC
  Filled 2021-05-15: qty 250

## 2021-05-15 MED ORDER — IRON SUCROSE 20 MG/ML IV SOLN
200.0000 mg | Freq: Once | INTRAVENOUS | Status: AC
  Administered 2021-05-15: 200 mg via INTRAVENOUS

## 2021-05-22 ENCOUNTER — Ambulatory Visit: Payer: 59

## 2021-05-22 ENCOUNTER — Inpatient Hospital Stay: Payer: 59

## 2021-05-22 ENCOUNTER — Other Ambulatory Visit: Payer: Self-pay

## 2021-05-22 VITALS — BP 118/75 | HR 78 | Temp 98.2°F | Resp 18

## 2021-05-22 DIAGNOSIS — D508 Other iron deficiency anemias: Secondary | ICD-10-CM | POA: Diagnosis not present

## 2021-05-22 DIAGNOSIS — D509 Iron deficiency anemia, unspecified: Secondary | ICD-10-CM

## 2021-05-22 DIAGNOSIS — Z9884 Bariatric surgery status: Secondary | ICD-10-CM | POA: Diagnosis not present

## 2021-05-22 MED ORDER — IRON SUCROSE 20 MG/ML IV SOLN
200.0000 mg | Freq: Once | INTRAVENOUS | Status: AC
  Administered 2021-05-22: 200 mg via INTRAVENOUS

## 2021-05-22 MED ORDER — SODIUM CHLORIDE 0.9 % IV SOLN: 200.0000 mg | Freq: Once | INTRAVENOUS | Status: DC

## 2021-05-22 MED ORDER — SODIUM CHLORIDE 0.9 % IV SOLN
Freq: Once | INTRAVENOUS | Status: AC
  Filled 2021-05-22: qty 250

## 2021-06-04 DIAGNOSIS — S92515A Nondisplaced fracture of proximal phalanx of left lesser toe(s), initial encounter for closed fracture: Secondary | ICD-10-CM | POA: Diagnosis not present

## 2021-06-04 DIAGNOSIS — M79672 Pain in left foot: Secondary | ICD-10-CM | POA: Diagnosis not present

## 2021-06-11 ENCOUNTER — Other Ambulatory Visit: Payer: Self-pay

## 2021-06-11 DIAGNOSIS — M5431 Sciatica, right side: Secondary | ICD-10-CM | POA: Diagnosis not present

## 2021-06-11 DIAGNOSIS — M9905 Segmental and somatic dysfunction of pelvic region: Secondary | ICD-10-CM | POA: Diagnosis not present

## 2021-06-11 DIAGNOSIS — M4306 Spondylolysis, lumbar region: Secondary | ICD-10-CM | POA: Diagnosis not present

## 2021-06-11 DIAGNOSIS — M9903 Segmental and somatic dysfunction of lumbar region: Secondary | ICD-10-CM | POA: Diagnosis not present

## 2021-06-11 MED FILL — Ibuprofen Tab 600 MG: ORAL | 15 days supply | Qty: 60 | Fill #2 | Status: AC

## 2021-06-12 ENCOUNTER — Other Ambulatory Visit: Payer: Self-pay

## 2021-06-16 DIAGNOSIS — M9903 Segmental and somatic dysfunction of lumbar region: Secondary | ICD-10-CM | POA: Diagnosis not present

## 2021-06-16 DIAGNOSIS — M4306 Spondylolysis, lumbar region: Secondary | ICD-10-CM | POA: Diagnosis not present

## 2021-06-16 DIAGNOSIS — M5431 Sciatica, right side: Secondary | ICD-10-CM | POA: Diagnosis not present

## 2021-06-16 DIAGNOSIS — M9905 Segmental and somatic dysfunction of pelvic region: Secondary | ICD-10-CM | POA: Diagnosis not present

## 2021-06-17 ENCOUNTER — Other Ambulatory Visit: Payer: Self-pay

## 2021-06-17 ENCOUNTER — Other Ambulatory Visit (HOSPITAL_COMMUNITY): Payer: Self-pay

## 2021-06-17 ENCOUNTER — Ambulatory Visit
Admission: RE | Admit: 2021-06-17 | Discharge: 2021-06-17 | Disposition: A | Discharge status: Home / Self Care | Payer: 59 | Source: Ambulatory Visit | Attending: Family Medicine | Admitting: Family Medicine

## 2021-06-17 DIAGNOSIS — Z1231 Encounter for screening mammogram for malignant neoplasm of breast: Secondary | ICD-10-CM | POA: Diagnosis not present

## 2021-06-23 DIAGNOSIS — M9905 Segmental and somatic dysfunction of pelvic region: Secondary | ICD-10-CM | POA: Diagnosis not present

## 2021-06-23 DIAGNOSIS — M9903 Segmental and somatic dysfunction of lumbar region: Secondary | ICD-10-CM | POA: Diagnosis not present

## 2021-06-23 DIAGNOSIS — M4306 Spondylolysis, lumbar region: Secondary | ICD-10-CM | POA: Diagnosis not present

## 2021-06-23 DIAGNOSIS — M5431 Sciatica, right side: Secondary | ICD-10-CM | POA: Diagnosis not present

## 2021-06-30 ENCOUNTER — Other Ambulatory Visit: Payer: Self-pay

## 2021-06-30 ENCOUNTER — Encounter: Payer: Self-pay | Admitting: Oncology

## 2021-06-30 ENCOUNTER — Other Ambulatory Visit (HOSPITAL_COMMUNITY): Payer: Self-pay

## 2021-07-01 ENCOUNTER — Other Ambulatory Visit (HOSPITAL_COMMUNITY): Payer: Self-pay

## 2021-07-01 DIAGNOSIS — M9905 Segmental and somatic dysfunction of pelvic region: Secondary | ICD-10-CM | POA: Diagnosis not present

## 2021-07-01 DIAGNOSIS — M9903 Segmental and somatic dysfunction of lumbar region: Secondary | ICD-10-CM | POA: Diagnosis not present

## 2021-07-01 DIAGNOSIS — M4306 Spondylolysis, lumbar region: Secondary | ICD-10-CM | POA: Diagnosis not present

## 2021-07-01 DIAGNOSIS — M5431 Sciatica, right side: Secondary | ICD-10-CM | POA: Diagnosis not present

## 2021-07-07 ENCOUNTER — Other Ambulatory Visit: Payer: Self-pay

## 2021-07-08 ENCOUNTER — Other Ambulatory Visit: Payer: Self-pay

## 2021-07-08 DIAGNOSIS — M9903 Segmental and somatic dysfunction of lumbar region: Secondary | ICD-10-CM | POA: Diagnosis not present

## 2021-07-08 DIAGNOSIS — M9905 Segmental and somatic dysfunction of pelvic region: Secondary | ICD-10-CM | POA: Diagnosis not present

## 2021-07-08 DIAGNOSIS — M5431 Sciatica, right side: Secondary | ICD-10-CM | POA: Diagnosis not present

## 2021-07-08 DIAGNOSIS — M4306 Spondylolysis, lumbar region: Secondary | ICD-10-CM | POA: Diagnosis not present

## 2021-07-16 ENCOUNTER — Ambulatory Visit: Payer: 59 | Admitting: Urology

## 2021-07-16 ENCOUNTER — Other Ambulatory Visit: Payer: Self-pay

## 2021-07-16 ENCOUNTER — Encounter: Payer: Self-pay | Admitting: Urology

## 2021-07-16 VITALS — BP 139/84 | HR 91 | Ht 69.0 in | Wt 199.0 lb

## 2021-07-16 DIAGNOSIS — N3001 Acute cystitis with hematuria: Secondary | ICD-10-CM

## 2021-07-16 DIAGNOSIS — N200 Calculus of kidney: Secondary | ICD-10-CM | POA: Diagnosis not present

## 2021-07-16 DIAGNOSIS — R109 Unspecified abdominal pain: Secondary | ICD-10-CM

## 2021-07-16 DIAGNOSIS — Z87442 Personal history of urinary calculi: Secondary | ICD-10-CM

## 2021-07-16 LAB — URINALYSIS, COMPLETE
Bilirubin, UA: NEGATIVE
Glucose, UA: NEGATIVE
Ketones, UA: NEGATIVE
Nitrite, UA: NEGATIVE
Protein,UA: NEGATIVE
Specific Gravity, UA: 1.02 (ref 1.005–1.030)
Urobilinogen, Ur: 0.2 mg/dL (ref 0.2–1.0)
pH, UA: 5.5 (ref 5.0–7.5)

## 2021-07-16 LAB — MICROSCOPIC EXAMINATION: WBC, UA: >30 /hpf — AB (ref 0–5)

## 2021-07-16 MED ORDER — FLUCONAZOLE 150 MG PO TABS
150.0000 mg | ORAL_TABLET | Freq: Once | ORAL | 0 refills | Status: AC
  Filled 2021-07-16: qty 1, 1d supply, fill #0

## 2021-07-16 MED ORDER — CEFUROXIME AXETIL 250 MG PO TABS
250.0000 mg | ORAL_TABLET | Freq: Two times a day (BID) | ORAL | 0 refills | Status: AC
  Filled 2021-07-16: qty 14, 7d supply, fill #0

## 2021-07-16 NOTE — Progress Notes (Signed)
07/16/2021 3:31 PM   Elizabeth Good Sep 22, 1965 [id number]  Referring provider: Gayland Curry, MD [address] [place],  [place] [address]  Chief Complaint  Patient presents with   Nephrolithiasis    HPI: 54 y.o. female with a history of recurrent stone disease presents for evaluation of possible calculus   1-1.5 week history of urinary frequency, urgency, dysuria and flank pain Was going out of town last week and had 3 days of antibiotics which she took however this did not improve her symptoms No fever, chills, nausea or vomiting Denies gross hematuria Last imaging 05/2020   PMH: Past Medical History:  Diagnosis Date   Allergy    Anemia    Bartholin cyst 1990   Basal cell carcinoma    Curvature of spine    Family history of adverse reaction to anesthesia    Mother - PONV   GERD (gastroesophageal reflux disease)    High cholesterol    History of bladder infections    History of hiatal hernia    History of kidney infection    History of kidney stones    h/o   Hypertension    Kidney stones    Low serum iron    Meningitis    Age 28   PONV (postoperative nausea and vomiting)    hard to wake up   PVC's (premature ventricular contractions)    with coffee   Renal disorder    Scoliosis    Seizures ([place])    none since 05/1997.  on medication   Wears contact lenses     Surgical History: Past Surgical History:  Procedure Laterality Date   ANTERIOR AND POSTERIOR VAGINAL REPAIR     [place] N/A 08/08/2019   Procedure: UTERINE SEROSA BIOPSIES;  Surgeon: Malachy Mood, MD;  Location: [facility];  Service: Gynecology;  Laterality: N/A;   BLADDER SURGERY     BREAST BIOPSY Left 06/29/2019   left breast stereo bx/ x clip/FOCAL ATYPICAL DUCT HYPERPLASIA WITH MICROCALCIFICATIONS.    BREAST BIOPSY Left 07/14/2019   left breast stereo bx/ coil clip/ neg   BREAST BIOPSY Left 08/11/2019   left breast stereo at [facility]/Atypical lobular  hyperplasia [facility]), fibroadenomatoid change with sclerosing adenosis, and focal pseudoangiomatous stromal hyperplasia (PASH) with associated microcalcifications.   BREAST EXCISIONAL BIOPSY Left 09/21/2019   Atypical ductal hyperplasia of left breast   CHOLECYSTECTOMY  2011   COLONOSCOPY WITH PROPOFOL N/A 02/07/2021   Procedure: COLONOSCOPY WITH BIOPSY;  Surgeon: Lucilla Lame, MD;  Location: [place];  Service: Endoscopy;  Laterality: N/A;   COMBINED HYSTEROSCOPY DIAGNOSTIC / D&C  12/06/2014   [facility]   ENDOMETRIAL ABLATION  2011   [facility]  Dr. Amalia Hailey   EXTRACORPOREAL SHOCK WAVE LITHOTRIPSY Right 08/25/2018   Procedure: EXTRACORPOREAL SHOCK WAVE LITHOTRIPSY (ESWL);  Surgeon: Billey Co, MD;  Location: [facility];  Service: Urology;  Laterality: Right;   FOOT SURGERY Left    GANGLION CYST EXCISION     GASTRIC BYPASS  2011   INCONTINENCE SURGERY     LAPAROSCOPIC BILATERAL SALPINGECTOMY  08/08/2019   Procedure: LAPAROSCOPIC BILATERAL SALPINGECTOMY;  Surgeon: Malachy Mood, MD;  Location: [facility];  Service: Gynecology;;   LITHOTRIPSY  2016   OOPHORECTOMY Left 2020   POLYPECTOMY N/A 02/07/2021   Procedure: POLYPECTOMY;  Surgeon: Lucilla Lame, MD;  Location: [place];  Service: Endoscopy;  Laterality: N/A;   RECTAL SURGERY     SEPTOPLASTY     TONSILLECTOMY  Home Medications:  Allergies as of 07/16/2021       Reactions   Dermabase [albolene]    Other Dermatitis, Itching, Other (See Comments)   DERMABOND; BLISTERING   Latex Rash   Dental appliances, balloons, gloves, condoms   Penicillins Rash   Did it involve swelling of the face/tongue/throat, SOB, or low BP? No Did it involve sudden or severe rash/hives, skin peeling, or any reaction on the inside of your mouth or nose? No Did you need to seek medical attention at a hospital or doctor's office? No When did it last happen?       If all above answers are "NO", may proceed with cephalosporin  use.   Sulfa Antibiotics Rash        Medication List        Accurate as of July 16, 2021  3:31 PM. If you have any questions, ask your nurse or doctor.          cetirizine 10 MG tablet Commonly known as: ZYRTEC Take 10 mg by mouth at bedtime.   cyanocobalamin 1000 MCG tablet once a week.   dexlansoprazole 60 MG capsule Commonly known as: DEXILANT Take 60 mg by mouth at bedtime.   dexlansoprazole 60 MG capsule Commonly known as: DEXILANT TAKE 1 CAPSULE (60 MG TOTAL) BY MOUTH ONCE DAILY   dexlansoprazole 60 MG capsule Commonly known as: DEXILANT Take 1 capsule (60 mg total) by mouth once daily as needed (GERD (wean down if possible to avoid long term side effects))   ibuprofen 600 MG tablet Commonly known as: ADVIL TAKE 1 TABLET (600 MG TOTAL) BY MOUTH EVERY 6 (SIX) HOURS AS NEEDED.   LamoTRIgine 200 MG Tb24 24 hour tablet One by mouth twice daily. Dr. Reece Levy brand only.   LamoTRIgine 200 MG Tb24 24 hour tablet Take 1 tablet (200 mg total) by mouth 2 (two) times daily   multivitamin tablet Take 1 tablet by mouth daily.   promethazine 25 MG tablet Commonly known as: PHENERGAN Take 1 tablet (25 mg total) by mouth every 6 (six) hours as needed for nausea or vomiting.   Sutab 708 [id number] MG Tabs Generic drug: Sodium Sulfate-Mag Sulfate-KCl Take as directed (Take 376 mg by mouth as directed.)   tamsulosin 0.4 MG Caps capsule Commonly known as: FLOMAX   triamterene-hydrochlorothiazide 37.5-25 MG capsule Commonly known as: DYAZIDE Take 1 capsule by mouth every morning   Vitamin D3 125 MCG (5000 UT) Tabs Take 5,000 Units by mouth every 7 (seven) days.        Allergies:  Allergies  Allergen Reactions   Dermabase [Albolene]    Other Dermatitis, Itching and Other (See Comments)    DERMABOND; BLISTERING   Latex Rash    Dental appliances, balloons, gloves, condoms   Penicillins Rash    Did it involve swelling of the face/tongue/throat, SOB, or low  BP? No Did it involve sudden or severe rash/hives, skin peeling, or any reaction on the inside of your mouth or nose? No Did you need to seek medical attention at a hospital or doctor's office? No When did it last happen?       If all above answers are "NO", may proceed with cephalosporin use.    Sulfa Antibiotics Rash    Family History: Family History  Problem Relation Age of Onset   Bladder Cancer Mother    Prostate cancer Father    Melanoma Father    Breast cancer Cousin     Social History:  reports that  she has never smoked. She has never used smokeless tobacco. She reports current alcohol use. She reports that she does not use drugs.   Physical Exam: See rooming tab for vitals LMP 11/29/2017 (Approximate)   Constitutional:  Alert and oriented, No acute distress. HEENT: [place] AT, moist mucus membranes.  Trachea midline, no masses. Cardiovascular: No clubbing, cyanosis, or edema. Respiratory: Normal respiratory effort, no increased work of breathing. Psychiatric: Normal mood and affect.  Laboratory Data:  Urinalysis Dipstick 2+ leukocytes/trace blood; microscopy >30 WBC, many bacteria   Assessment & Plan:    1.  UTI Urine culture ordered Cefuroxime 250 mg twice daily x7 days pending urine C&S She also requested Rx Diflucan 150 mg  2.  Flank pain with history recurrent nephrolithiasis Stone protocol CT abdomen/pelvis   Abbie Sons, MD  [facility] [address], [place], [place] [address] [phone]

## 2021-07-17 ENCOUNTER — Ambulatory Visit (INDEPENDENT_AMBULATORY_CARE_PROVIDER_SITE_OTHER): Payer: 59 | Admitting: Obstetrics and Gynecology

## 2021-07-17 ENCOUNTER — Other Ambulatory Visit (HOSPITAL_COMMUNITY)
Admission: RE | Admit: 2021-07-17 | Discharge: 2021-07-17 | Disposition: A | Discharge status: Home / Self Care | Payer: 59 | Source: Ambulatory Visit | Attending: Obstetrics and Gynecology | Admitting: Obstetrics and Gynecology

## 2021-07-17 ENCOUNTER — Encounter: Payer: Self-pay | Admitting: Obstetrics and Gynecology

## 2021-07-17 ENCOUNTER — Other Ambulatory Visit: Payer: Self-pay

## 2021-07-17 VITALS — BP 157/80 | HR 92 | Ht 65.0 in | Wt 202.0 lb

## 2021-07-17 DIAGNOSIS — Z124 Encounter for screening for malignant neoplasm of cervix: Secondary | ICD-10-CM | POA: Insufficient documentation

## 2021-07-17 DIAGNOSIS — N952 Postmenopausal atrophic vaginitis: Secondary | ICD-10-CM | POA: Diagnosis not present

## 2021-07-17 DIAGNOSIS — Z01419 Encounter for gynecological examination (general) (routine) without abnormal findings: Secondary | ICD-10-CM

## 2021-07-17 DIAGNOSIS — K644 Residual hemorrhoidal skin tags: Secondary | ICD-10-CM

## 2021-07-17 DIAGNOSIS — N941 Unspecified dyspareunia: Secondary | ICD-10-CM | POA: Diagnosis not present

## 2021-07-17 MED ORDER — INTRAROSA 6.5 MG VA INST
6.5000 mg | VAGINAL_INSERT | Freq: Every day | VAGINAL | 11 refills | Status: DC
  Filled 2021-07-17 – 2021-08-11 (×4): qty 28, 28d supply, fill #0
  Filled 2022-07-09: qty 28, 28d supply, fill #1

## 2021-07-17 MED ORDER — HYDROCORTISONE ACETATE 25 MG RE SUPP
25.0000 mg | Freq: Two times a day (BID) | RECTAL | 11 refills | Status: AC | PRN
  Filled 2021-07-17: qty 20, 10d supply, fill #0

## 2021-07-17 MED ORDER — IBUPROFEN 600 MG PO TABS
ORAL_TABLET | Freq: Four times a day (QID) | ORAL | 6 refills | Status: AC | PRN
Start: 2021-07-17 — End: 2022-07-28
  Filled 2021-07-17: qty 60, 15d supply, fill #0
  Filled 2021-10-03: qty 60, 15d supply, fill #1
  Filled 2021-11-27: qty 60, 15d supply, fill #2
  Filled 2022-04-13: qty 60, 15d supply, fill #3
  Filled 2022-07-09: qty 60, 15d supply, fill #4

## 2021-07-17 NOTE — Progress Notes (Signed)
Gynecology Annual Exam  PCP: Gayland Curry, MD  Chief Complaint:  Chief Complaint  Patient presents with   Gynecologic Exam    Annual - vaginal dryness, hemorrhoids. RM 3     History of Present Illness:Patient is a 55 y.o. G2P2 presents for annual exam. The patient has no complaints today.   LMP: Patient's last menstrual period was 11/29/2017 (approximate). No postmenopausal bleeding  The patient is sexually active. She denies dyspareunia.  The patient does perform self breast exams.  There is notable family history of breast or ovarian cancer in her family.  Patient with personal history of breast cancer.  The patient wears seatbelts: yes.   The patient has regular exercise: not asked.    The patient denies current symptoms of depression.  Followed by [facility] for high risk breast cancer history for a history of ADH and ALH not on chemoprophylaxis currently.  Review of Systems: Review of Systems  Constitutional:  Negative for chills and fever.  HENT:  Negative for congestion.   Respiratory:  Negative for cough and shortness of breath.   Cardiovascular:  Negative for chest pain and palpitations.  Gastrointestinal:  Negative for abdominal pain, constipation, diarrhea, heartburn, nausea and vomiting.  Genitourinary:  Negative for dysuria, frequency and urgency.  Skin:  Negative for itching and rash.  Neurological:  Negative for dizziness and headaches.  Endo/Heme/Allergies:  Negative for polydipsia.  Psychiatric/Behavioral:  Negative for depression.    Past Medical History:  Patient Active Problem List   Diagnosis Date Noted   Special screening for malignant neoplasms, colon    Polyp of transverse colon    GERD (gastroesophageal reflux disease) 09/28/2018   Seizures ([place]) 09/28/2018   Personal history of kidney stones 06/23/2018   Microhematuria 06/23/2018   Dysuria 06/23/2018   Calcium, urinary 06/16/2018    Elevated, checked post gastric bypass due to do  increased risk for long term osteoporosis. This number places at risk for kidney stone formation- pt advised to hydrate well and if stones form in future we may would consider use of thiazide like chlorthadone or dyazide.    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 ( benign tumor)    High cholesterol 02/25/2016    Overview:  The 10-year ASCVD risk score Mikey Bussing DC Brooke Bonito, et al., 2013) is: 0.9%-2017 The 10-year ASCVD risk score Mikey Bussing DC Jr., et al., 2013) is: 1.1%-2018    Chronic cystitis 08/14/2013   Kidney stone 38/75/6433   Renal colic 29/51/8841    Past Surgical History:  Past Surgical History:  Procedure Laterality Date   [facility]   BIOPSY N/A 08/08/2019   Procedure: UTERINE SEROSA BIOPSIES;  Surgeon: Malachy Mood, MD;  Location: [facility];  Service: Gynecology;  Laterality: N/A;   BLADDER SURGERY     BREAST BIOPSY Left 06/29/2019   left breast stereo bx/ x clip/FOCAL ATYPICAL DUCT HYPERPLASIA WITH MICROCALCIFICATIONS.    BREAST BIOPSY Left 07/14/2019   left breast stereo bx/ coil clip/ neg   BREAST BIOPSY Left 08/11/2019   left breast stereo at [facility]/Atypical lobular hyperplasia [facility]), fibroadenomatoid change with sclerosing adenosis, and focal pseudoangiomatous stromal hyperplasia (PASH) with associated microcalcifications.   BREAST EXCISIONAL BIOPSY Left 09/21/2019   Atypical  ductal hyperplasia of left breast   CHOLECYSTECTOMY  2011   COLONOSCOPY WITH PROPOFOL N/A 02/07/2021   Procedure: COLONOSCOPY WITH BIOPSY;  Surgeon: Lucilla Lame, MD;  Location: [place];  Service: Endoscopy;  Laterality: N/A;   COMBINED HYSTEROSCOPY DIAGNOSTIC / D&C  12/06/2014   [facility]   ENDOMETRIAL ABLATION  2011    [facility]  Dr. Amalia Hailey   EXTRACORPOREAL SHOCK WAVE LITHOTRIPSY Right 08/25/2018   Procedure: EXTRACORPOREAL SHOCK WAVE LITHOTRIPSY (ESWL);  Surgeon: Billey Co, MD;  Location: [facility];  Service: Urology;  Laterality: Right;   FOOT SURGERY Left    GANGLION CYST EXCISION     GASTRIC BYPASS  2011   INCONTINENCE SURGERY     LAPAROSCOPIC BILATERAL SALPINGECTOMY  08/08/2019   Procedure: LAPAROSCOPIC BILATERAL SALPINGECTOMY;  Surgeon: Malachy Mood, MD;  Location: [facility];  Service: Gynecology;;   LITHOTRIPSY  2016   OOPHORECTOMY Left 2020   POLYPECTOMY N/A 02/07/2021   Procedure: POLYPECTOMY;  Surgeon: Lucilla Lame, MD;  Location: [place];  Service: Endoscopy;  Laterality: N/A;   RECTAL SURGERY     SEPTOPLASTY     TONSILLECTOMY      Gynecologic History:  Patient's last menstrual period was 11/29/2017 (approximate). Last Pap: Results were: 3/28/2019NIL and HR HPV negative   Obstetric History: G2P2  Family History:  Family History  Problem Relation Age of Onset   Bladder Cancer Mother    Prostate cancer Father    Melanoma Father    Breast cancer Cousin     Social History:  Social History   Socioeconomic History   Marital status: Divorced    Spouse name: Not on file   Number of children: Not on file   Years of education: Not on file   Highest education level: Not on file  Occupational History   Not on file  Tobacco Use   Smoking status: Never   Smokeless tobacco: Never  Vaping Use   Vaping Use: Never used  Substance and Sexual Activity   Alcohol use: Yes    Alcohol/week: 0.0 standard drinks    Comment: social - 1 glass wine/month   Drug use: No   Sexual activity: Yes    Birth control/protection: Pill  Other Topics Concern   Not on file  Social History Narrative   Not on file   Social Determinants of Health   Financial Resource Strain: Not on file  Food Insecurity: Not on file  Transportation Needs: Not on file  Physical Activity:  Not on file  Stress: Not on file  Social Connections: Not on file  Intimate Partner Violence: Not on file    Allergies:  Allergies  Allergen Reactions   Dermabase [Albolene]    Other Dermatitis, Itching and Other (See Comments)    DERMABOND; BLISTERING   Latex Rash    Dental appliances, balloons, gloves, condoms   Penicillins Rash    Did it involve swelling of the face/tongue/throat, SOB, or low BP? No Did it involve sudden or severe rash/hives, skin peeling, or any reaction on the inside of your mouth or nose? No Did you need to seek medical attention at a hospital or doctor's office? No When did it last happen?       If all above answers are "NO", may proceed with cephalosporin use.    Sulfa Antibiotics Rash    Medications: Prior to Admission medications   Medication Sig Start Date End Date Taking? Authorizing Provider  cefUROXime (CEFTIN) 250 MG tablet Take 1 tablet (  250 mg total) by mouth 2 (two) times daily with a meal for 7 days. 07/16/21 07/23/21 Yes Stoioff, Ronda Fairly, MD  cetirizine (ZYRTEC) 10 MG tablet Take 10 mg by mouth at bedtime.    Yes [provider]  Cholecalciferol (VITAMIN D3) 5000 units TABS Take 5,000 Units by mouth every 7 (seven) days. 10/12/11  Yes [provider]  dexlansoprazole (DEXILANT) 60 MG capsule Take 60 mg by mouth at bedtime.    Yes [provider]  fluconazole (DIFLUCAN) 150 MG tablet Take 1 tablet (150 mg total) by mouth once for 1 dose. 07/16/21 07/17/21 Yes Stoioff, Ronda Fairly, MD  ibuprofen (ADVIL) 600 MG tablet TAKE 1 TABLET (600 MG TOTAL) BY MOUTH EVERY 6 (SIX) HOURS AS NEEDED. 10/08/20 10/08/21 Yes Malachy Mood, MD  LamoTRIgine 200 MG TB24 24 hour tablet One by mouth twice daily. Dr. Reece Levy brand only. 04/07/21  Yes   Multiple Vitamin (MULTIVITAMIN) tablet Take 1 tablet by mouth daily.   Yes [provider]  Red Yeast Rice 600 MG TABS  07/17/21  Yes [provider]  triamterene-hydrochlorothiazide  (DYAZIDE) 37.5-25 MG capsule Take 1 capsule by mouth every morning 04/07/21  Yes     Physical Exam Vitals: Blood pressure (!) 157/80, pulse 92, height 5\' 5"  (1.651 m), weight 202 lb (91.6 kg), last menstrual period 11/29/2017.  General: NAD HEENT: normocephalic, anicteric Thyroid: no enlargement, no palpable nodules Pulmonary: No increased work of breathing, CTAB Cardiovascular: RRR, distal pulses 2+ Breast: Declines being followed at [facility] Abdomen: NABS, soft, non-tender, non-distended.  Umbilicus without lesions.  No hepatomegaly, splenomegaly or masses palpable. No evidence of hernia  Genitourinary:  External: Normal external female genitalia.  Normal urethral meatus, normal Bartholin's and Skene's glands.    Vagina: Normal vaginal mucosa, no evidence of prolapse.    Cervix: Grossly normal in appearance, no bleeding  Uterus: Non-enlarged, mobile, normal contour.  No CMT  Adnexa: ovaries non-enlarged, no adnexal masses  Rectal: deferred  Lymphatic: no evidence of inguinal lymphadenopathy Extremities: no edema, erythema, or tenderness Neurologic: Grossly intact Psychiatric: mood appropriate, affect full  Female chaperone present for pelvic and breast  portions of the physical exam     Assessment: 55 y.o. G2P2 routine annual exam  Plan: Problem List Items Addressed This Visit   None Visit Diagnoses     Encounter for gynecological examination without abnormal finding    -  Primary   Screening for malignant neoplasm of cervix       Relevant Orders   Cytology - PAP   Vaginal atrophy       Dyspareunia, female       External hemorrhoid           1) Mammogram - recommend yearly screening mammogram.  Mammogram Is up to date  2) STI screening  was notoffered and therefore not obtained  3) ASCCP guidelines and rational discussed.  Patient opts for every 3 years screening interval  4) Osteoporosis  - per USPTF routine screening DEXA at age 46  5) Routine healthcare  maintenance including cholesterol, diabetes screening discussed managed by PCP  6) Hemorrhoid - Rx anusol suppository  7) Colonoscopy UTD 01/2021  8) Vaginal Dryness - given history of ALH/ADH discussed non-estrogen containing options including pH neurtral lubricant or intrarosa vaginal suppositories  9) Return in about 1 year (around 07/17/2022) for annual.    Malachy Mood, MD Mosetta Pigeon, [place] 07/17/2021, 9:05 AM

## 2021-07-18 ENCOUNTER — Other Ambulatory Visit: Payer: Self-pay

## 2021-07-23 ENCOUNTER — Other Ambulatory Visit: Payer: Self-pay | Admitting: Obstetrics and Gynecology

## 2021-07-23 ENCOUNTER — Other Ambulatory Visit: Payer: Self-pay

## 2021-07-28 ENCOUNTER — Telehealth: Payer: Self-pay

## 2021-07-28 ENCOUNTER — Other Ambulatory Visit: Payer: Self-pay

## 2021-07-28 ENCOUNTER — Other Ambulatory Visit: Payer: Self-pay | Admitting: Obstetrics and Gynecology

## 2021-07-28 MED ORDER — NITROFURANTOIN MONOHYD MACRO 100 MG PO CAPS
100.0000 mg | ORAL_CAPSULE | Freq: Two times a day (BID) | ORAL | 0 refills | Status: AC
  Filled 2021-07-28: qty 10, 5d supply, fill #0

## 2021-07-28 NOTE — Telephone Encounter (Signed)
Macrobid Rx called in she can also drop of a urine culture if she'd like.  The macrobid should actually not cause a yeast infection as its only active in the urinary tract

## 2021-07-28 NOTE — Telephone Encounter (Signed)
Seen by AMS 07/17/21. She has been burning and going to the bathroom a lot. She believes she has a UTI. She did an at home test that was positive. AMS had mentioned he could send her in meds. Cb#[phone]

## 2021-07-28 NOTE — Telephone Encounter (Signed)
Pt calls triage line with complaints of possible UTI. Pt questions if an antibiotic can be sent in for her as it is difficult for her to leave work. Advised pt that it is against our office protocol to send in antibiotics without seeing the patient for work up and UA, Urine CX. Offered pt appointment today or tomorrow with PA pt declines. States she will call her OB/GYN.

## 2021-07-28 NOTE — Telephone Encounter (Signed)
Spoke w/patient. Symptoms started yesterday. She has not had fever, but had nausea and was hot/cold. She states the at home test was positive for Leukocytes and Nitrites. She is scheduled for a CT Scan Thursday to check for Kidney Stones. She requests a diflucan also to prevent yeast infection.

## 2021-07-28 NOTE — Telephone Encounter (Signed)
Patient aware. She does not want to do a culture at this time. She is aware if she is still having symptoms after completion of the antibiotic, a culture will be needed.

## 2021-07-30 ENCOUNTER — Encounter: Payer: Self-pay | Admitting: Obstetrics and Gynecology

## 2021-07-30 DIAGNOSIS — M5431 Sciatica, right side: Secondary | ICD-10-CM | POA: Diagnosis not present

## 2021-07-30 DIAGNOSIS — M4306 Spondylolysis, lumbar region: Secondary | ICD-10-CM | POA: Diagnosis not present

## 2021-07-30 DIAGNOSIS — M9905 Segmental and somatic dysfunction of pelvic region: Secondary | ICD-10-CM | POA: Diagnosis not present

## 2021-07-30 DIAGNOSIS — M9903 Segmental and somatic dysfunction of lumbar region: Secondary | ICD-10-CM | POA: Diagnosis not present

## 2021-07-30 LAB — CYTOLOGY - PAP
Comment: NEGATIVE
Comment: NEGATIVE
HPV 16: NEGATIVE
HPV 18 / 45: NEGATIVE
High risk HPV: POSITIVE — AB

## 2021-07-31 ENCOUNTER — Other Ambulatory Visit: Payer: Self-pay

## 2021-07-31 ENCOUNTER — Ambulatory Visit
Admission: RE | Admit: 2021-07-31 | Discharge: 2021-07-31 | Disposition: A | Discharge status: Home / Self Care | Payer: 59 | Source: Ambulatory Visit | Attending: Urology | Admitting: Urology

## 2021-07-31 ENCOUNTER — Encounter: Payer: Self-pay | Admitting: Obstetrics and Gynecology

## 2021-07-31 DIAGNOSIS — N200 Calculus of kidney: Secondary | ICD-10-CM | POA: Diagnosis not present

## 2021-07-31 DIAGNOSIS — R109 Unspecified abdominal pain: Secondary | ICD-10-CM | POA: Insufficient documentation

## 2021-07-31 DIAGNOSIS — Z9889 Other specified postprocedural states: Secondary | ICD-10-CM | POA: Diagnosis not present

## 2021-07-31 DIAGNOSIS — Z87442 Personal history of urinary calculi: Secondary | ICD-10-CM | POA: Insufficient documentation

## 2021-07-31 DIAGNOSIS — K449 Diaphragmatic hernia without obstruction or gangrene: Secondary | ICD-10-CM | POA: Diagnosis not present

## 2021-07-31 DIAGNOSIS — N3001 Acute cystitis with hematuria: Secondary | ICD-10-CM | POA: Insufficient documentation

## 2021-07-31 DIAGNOSIS — M5136 Other intervertebral disc degeneration, lumbar region: Secondary | ICD-10-CM | POA: Diagnosis not present

## 2021-07-31 NOTE — Progress Notes (Signed)
Needs colposcopy visit in the next 1-4 weeks

## 2021-08-04 ENCOUNTER — Telehealth: Payer: Self-pay | Admitting: Family Medicine

## 2021-08-04 NOTE — Telephone Encounter (Signed)
Pain that radiates down the leg is not kidney stone pain but more indicative of nerve related pain.  This would need to be initially evaluated by her PCP.

## 2021-08-04 NOTE — Telephone Encounter (Signed)
Patient notified and states she has pain on the right side that goes down her leg. She is wanting to know if Lithotripsy can be done on this side?

## 2021-08-04 NOTE — Telephone Encounter (Signed)
Spoke to patient and she wants an appointment to discuss. Appointment made.

## 2021-08-04 NOTE — Telephone Encounter (Signed)
-----   Message from Abbie Sons, MD sent at 08/03/2021  9:38 AM EST ----- CT shows small calculus in each kidney.  They are nonobstructing and would not be causing pain.  Prior urinalysis was consistent with infection which was the most likely cause of her symptoms.  Recommend follow-up KUB 6 months

## 2021-08-06 ENCOUNTER — Inpatient Hospital Stay: Payer: 59 | Attending: Oncology

## 2021-08-06 ENCOUNTER — Other Ambulatory Visit: Payer: Self-pay

## 2021-08-06 DIAGNOSIS — D509 Iron deficiency anemia, unspecified: Secondary | ICD-10-CM

## 2021-08-06 DIAGNOSIS — Z9884 Bariatric surgery status: Secondary | ICD-10-CM | POA: Diagnosis not present

## 2021-08-06 DIAGNOSIS — D508 Other iron deficiency anemias: Secondary | ICD-10-CM | POA: Diagnosis not present

## 2021-08-06 LAB — CBC WITH DIFFERENTIAL/PLATELET
Abs Immature Granulocytes: 0.02 10*3/uL (ref 0.00–0.07)
Basophils Absolute: 0.1 10*3/uL (ref 0.0–0.1)
Basophils Relative: 1 %
Eosinophils Absolute: 0.4 10*3/uL (ref 0.0–0.5)
Eosinophils Relative: 4 %
HCT: 42 % (ref 36.0–46.0)
Hemoglobin: 13.7 g/dL (ref 12.0–15.0)
Immature Granulocytes: 0 %
Lymphocytes Relative: 25 %
Lymphs Abs: 2.2 10*3/uL (ref 0.7–4.0)
MCH: 28 pg (ref 26.0–34.0)
MCHC: 32.6 g/dL (ref 30.0–36.0)
MCV: 85.9 fL (ref 80.0–100.0)
Monocytes Absolute: 0.5 10*3/uL (ref 0.1–1.0)
Monocytes Relative: 6 %
Neutro Abs: 5.7 10*3/uL (ref 1.7–7.7)
Neutrophils Relative %: 64 %
Platelets: 361 10*3/uL (ref 150–400)
RBC: 4.89 MIL/uL (ref 3.87–5.11)
RDW: 14.9 % (ref 11.5–15.5)
WBC: 8.9 10*3/uL (ref 4.0–10.5)
nRBC: 0 % (ref 0.0–0.2)

## 2021-08-06 LAB — VITAMIN B12: Vitamin B-12: 1874 pg/mL — ABNORMAL HIGH (ref 180–914)

## 2021-08-06 LAB — FERRITIN: Ferritin: 13 ng/mL (ref 11–307)

## 2021-08-07 ENCOUNTER — Ambulatory Visit: Payer: 59

## 2021-08-07 ENCOUNTER — Inpatient Hospital Stay: Payer: 59

## 2021-08-07 ENCOUNTER — Encounter: Payer: Self-pay | Admitting: Nurse Practitioner

## 2021-08-07 ENCOUNTER — Inpatient Hospital Stay: Payer: 59 | Attending: Nurse Practitioner | Admitting: Hospice and Palliative Medicine

## 2021-08-07 ENCOUNTER — Ambulatory Visit: Payer: 59 | Admitting: Nurse Practitioner

## 2021-08-07 ENCOUNTER — Telehealth: Payer: Self-pay

## 2021-08-07 VITALS — BP 130/81 | HR 87 | Temp 99.0°F | Resp 16 | Wt 207.0 lb

## 2021-08-07 VITALS — BP 125/75 | HR 78

## 2021-08-07 DIAGNOSIS — D509 Iron deficiency anemia, unspecified: Secondary | ICD-10-CM

## 2021-08-07 DIAGNOSIS — D508 Other iron deficiency anemias: Secondary | ICD-10-CM | POA: Insufficient documentation

## 2021-08-07 DIAGNOSIS — Z79899 Other long term (current) drug therapy: Secondary | ICD-10-CM | POA: Insufficient documentation

## 2021-08-07 DIAGNOSIS — Z9884 Bariatric surgery status: Secondary | ICD-10-CM | POA: Insufficient documentation

## 2021-08-07 MED ORDER — SODIUM CHLORIDE 0.9 % IV SOLN
Freq: Once | INTRAVENOUS | Status: AC
  Filled 2021-08-07: qty 250

## 2021-08-07 MED ORDER — IRON SUCROSE 20 MG/ML IV SOLN
200.0000 mg | Freq: Once | INTRAVENOUS | Status: AC
  Administered 2021-08-07: 200 mg via INTRAVENOUS
  Filled 2021-08-07: qty 10

## 2021-08-07 MED ORDER — SODIUM CHLORIDE 0.9 % IV SOLN: 200.0000 mg | Freq: Once | INTRAVENOUS | Status: DC

## 2021-08-07 NOTE — Progress Notes (Signed)
[facility] Follow Up Note:  Patient Care Team: Gayland Curry, MD as PCP - General (Family Medicine)  REASON FOR VISIT Follow up for treatment of anemia.   HISTORY OF PRESENTING ILLNESS: Elizabeth Good 55 y.o. female with PMH list as below is referred by Dr.Aldridge for evaluation and management of iron deficiency anemia.  She has a history of iron deficiency anemia and history of gastric bypass in 2011. She was taking oral iron supplementation ferrous sulfate until this was discontinued due to GI side effects. Patient had colonoscopy done in June 2022 by Dr. Allen Norris.  S/p IV iron with Feraheme 510 mg IV for 2 doses. She is s/p Venofer 200mg  x 3 doses (last on 05/22/2021). Normal TSH, normal 2D echo   INTERVAL HISTORY Patient presents for follow up visit for evaluation of iron deficiency anemia.  Patient was last seen by Dr. Tasia Catchings on 05/08/2021 for iron deficiency anemia.  At that time, patient presented with fatigue.  She received IV Venofer 200 mg weekly x3 doses (last on 05/22/2021)     Review of Systems  Constitutional:  Positive for fatigue. Negative for appetite change and chills.  HENT:   Negative for hearing loss.   Eyes:  Negative for eye problems.  Respiratory:  Negative for chest tightness.   Cardiovascular:  Negative for chest pain.  Gastrointestinal:  Negative for abdominal distention.  Endocrine: Negative for hot flashes.  Genitourinary:  Negative for difficulty urinating.   Musculoskeletal:  Negative for arthralgias.  Skin:  Negative for itching.  Neurological:  Negative for dizziness.  Hematological:  Negative for adenopathy.  Psychiatric/Behavioral:  The patient is not nervous/anxious.    MEDICAL HISTORY: Past Medical History:  Diagnosis Date   Allergy    Anemia    Bartholin cyst 1990   Basal cell carcinoma    Curvature of spine    Family history of adverse reaction to anesthesia    Mother - PONV   GERD (gastroesophageal reflux  disease)    High cholesterol    History of bladder infections    History of hiatal hernia    History of kidney infection    History of kidney stones    h/o   Hypertension    Kidney stones    Low serum iron    Meningitis    Age 34   PONV (postoperative nausea and vomiting)    hard to wake up   PVC's (premature ventricular contractions)    with coffee   Renal disorder    Scoliosis    Seizures ([place])    none since 05/1997.  on medication   Wears contact lenses     SURGICAL HISTORY: Past Surgical History:  Procedure Laterality Date   ANTERIOR AND POSTERIOR VAGINAL REPAIR     [facility]   BIOPSY N/A 08/08/2019   Procedure: UTERINE SEROSA BIOPSIES;  Surgeon: Malachy Mood, MD;  Location: [facility];  Service: Gynecology;  Laterality: N/A;   BLADDER SURGERY     BREAST BIOPSY Left 06/29/2019   left breast stereo bx/ x clip/FOCAL ATYPICAL DUCT HYPERPLASIA WITH MICROCALCIFICATIONS.    BREAST BIOPSY Left 07/14/2019   left breast stereo bx/ coil clip/ neg   BREAST BIOPSY Left 08/11/2019   left breast stereo at [facility]/Atypical lobular hyperplasia [facility]), fibroadenomatoid change with sclerosing adenosis, and focal pseudoangiomatous stromal hyperplasia (PASH) with associated microcalcifications.   BREAST EXCISIONAL BIOPSY Left 09/21/2019   Atypical ductal hyperplasia of left breast   CHOLECYSTECTOMY  2011  COLONOSCOPY WITH PROPOFOL N/A 02/07/2021   Procedure: COLONOSCOPY WITH BIOPSY;  Surgeon: Lucilla Lame, MD;  Location: [place];  Service: Endoscopy;  Laterality: N/A;   COMBINED HYSTEROSCOPY DIAGNOSTIC / D&C  12/06/2014   [facility]   ENDOMETRIAL ABLATION  2011   [facility]  Dr. Amalia Hailey   EXTRACORPOREAL SHOCK WAVE LITHOTRIPSY Right 08/25/2018   Procedure: EXTRACORPOREAL SHOCK WAVE LITHOTRIPSY (ESWL);  Surgeon: Billey Co, MD;  Location: [facility];  Service: Urology;  Laterality: Right;   FOOT SURGERY Left    GANGLION CYST EXCISION     GASTRIC BYPASS  2011    INCONTINENCE SURGERY     LAPAROSCOPIC BILATERAL SALPINGECTOMY  08/08/2019   Procedure: LAPAROSCOPIC BILATERAL SALPINGECTOMY;  Surgeon: Malachy Mood, MD;  Location: [facility];  Service: Gynecology;;   LITHOTRIPSY  2016   OOPHORECTOMY Left 2020   POLYPECTOMY N/A 02/07/2021   Procedure: POLYPECTOMY;  Surgeon: Lucilla Lame, MD;  Location: [place];  Service: Endoscopy;  Laterality: N/A;   RECTAL SURGERY     SEPTOPLASTY     TONSILLECTOMY      SOCIAL HISTORY: Social History   Socioeconomic History   Marital status: Divorced    Spouse name: Not on file   Number of children: Not on file   Years of education: Not on file   Highest education level: Not on file  Occupational History   Not on file  Tobacco Use   Smoking status: Never   Smokeless tobacco: Never  Vaping Use   Vaping Use: Never used  Substance and Sexual Activity   Alcohol use: Yes    Alcohol/week: 0.0 standard drinks    Comment: social - 1 glass wine/month   Drug use: No   Sexual activity: Yes    Birth control/protection: Pill  Other Topics Concern   Not on file  Social History Narrative   Not on file   Social Determinants of Health   Financial Resource Strain: Not on file  Food Insecurity: Not on file  Transportation Needs: Not on file  Physical Activity: Not on file  Stress: Not on file  Social Connections: Not on file  Intimate Partner Violence: Not on file    FAMILY HISTORY Family History  Problem Relation Age of Onset   Bladder Cancer Mother    Prostate cancer Father    Melanoma Father    Breast cancer Cousin     ALLERGIES:  is allergic to dermabase [albolene], other, latex, penicillins, and sulfa antibiotics.  MEDICATIONS:  Current Outpatient Medications  Medication Sig Dispense Refill   cetirizine (ZYRTEC) 10 MG tablet Take 10 mg by mouth at bedtime.      Cholecalciferol (VITAMIN D3) 5000 units TABS Take 5,000 Units by mouth every 7 (seven) days.     dexlansoprazole  (DEXILANT) 60 MG capsule Take 60 mg by mouth at bedtime.      hydrocortisone (ANUSOL-HC) 25 MG suppository Place 1 suppository (25 mg total) rectally 2 (two) times daily as needed for up to 10 days for hemorrhoids or anal itching. 20 suppository 11   ibuprofen (ADVIL) 600 MG tablet TAKE 1 TABLET (600 MG TOTAL) BY MOUTH EVERY 6 (SIX) HOURS AS NEEDED. 60 tablet 6   LamoTRIgine 200 MG TB24 24 hour tablet One by mouth twice daily. Dr. Reece Levy brand only. 180 tablet 3   Multiple Vitamin (MULTIVITAMIN) tablet Take 1 tablet by mouth daily.     Prasterone (INTRAROSA) 6.5 MG INST Place 6.5 mg vaginally at bedtime. 28 each 11  Red Yeast Rice 600 MG TABS      triamterene-hydrochlorothiazide (DYAZIDE) 37.5-25 MG capsule Take 1 capsule by mouth every morning 90 capsule 3   No current facility-administered medications for this visit.    PHYSICAL EXAMINATION:  ECOG PERFORMANCE STATUS: 1 - Symptomatic but completely ambulatory  Vitals:   08/07/21 1443  BP: 130/81  Pulse: 87  Resp: 16  Temp: 99 F (37.2 C)    Filed Weights   08/07/21 1443  Weight: 207 lb (93.9 kg)     Physical Exam Constitutional:      General: She is not in acute distress.    Appearance: She is not diaphoretic.  HENT:     Head: Normocephalic and atraumatic.     Nose: Nose normal.     Mouth/Throat:     Pharynx: No oropharyngeal exudate.  Eyes:     General: No scleral icterus.       Left eye: No discharge.     Conjunctiva/sclera: Conjunctivae normal.     Pupils: Pupils are equal, round, and reactive to light.  Neck:     Vascular: No JVD.  Cardiovascular:     Rate and Rhythm: Normal rate and regular rhythm.     Heart sounds: Normal heart sounds. No murmur heard. Pulmonary:     Effort: Pulmonary effort is normal. No respiratory distress.     Breath sounds: Normal breath sounds. No rales.  Chest:     Chest wall: No tenderness.  Abdominal:     General: Bowel sounds are normal. There is no distension.     Palpations:  Abdomen is soft. There is no mass.     Tenderness: There is no abdominal tenderness.  Musculoskeletal:        General: Normal range of motion.     Cervical back: Normal range of motion and neck supple.  Lymphadenopathy:     Cervical: No cervical adenopathy.  Skin:    General: Skin is warm and dry.     Findings: No erythema.  Neurological:     Mental Status: She is alert and oriented to person, place, and time.     Cranial Nerves: No cranial nerve deficit.     Motor: No abnormal muscle tone.     Coordination: Coordination normal.     Gait: Gait is intact.  Psychiatric:        Mood and Affect: Affect normal.      LABORATORY DATA: I have personally reviewed the data as listed: CBC    Component Value Date/Time   WBC 8.9 08/06/2021 1416   RBC 4.89 08/06/2021 1416   HGB 13.7 08/06/2021 1416   HGB 11.1 (L) 11/21/2014 1327   HCT 42.0 08/06/2021 1416   HCT 34.9 (L) 11/21/2014 1327   PLT 361 08/06/2021 1416   PLT 275 11/21/2014 1327   MCV 85.9 08/06/2021 1416   MCV 80 11/21/2014 1327   MCH 28.0 08/06/2021 1416   MCHC 32.6 08/06/2021 1416   RDW 14.9 08/06/2021 1416   RDW 14.5 11/21/2014 1327   LYMPHSABS 2.2 08/06/2021 1416   MONOABS 0.5 08/06/2021 1416   EOSABS 0.4 08/06/2021 1416   BASOSABS 0.1 08/06/2021 1416   Iron/TIBC/Ferritin/ %Sat    Component Value Date/Time   IRON 110 04/25/2018 1517   TIBC 375 04/25/2018 1517   FERRITIN 13 08/06/2021 1416   IRONPCTSAT 29 04/25/2018 1517   RADIOGRAPHIC STUDIES: I have personally reviewed the radiological images as listed and agree with the findings in the report Mammogram 03/06/2016  IMPRESSION: 1. 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 2.  No mammographic evidence of malignancy in the bilateral breasts.   ASSESSMENT/PLAN  1. Iron deficiency anemia, unspecified iron deficiency anemia type   2. Hx of gastric bypass    #Iron deficiency anemia, likely secondary to previous  gastric bypass.  Labs today show normal CBC and ferritin.  Patient is not acutely anemic. However, discussed with Dr. Tasia Catchings and will proceed with Venofer 200 mg x 1 as ferritin is borderline low.  #Gastric bypass, patient with elevated serum B12 secondary to supplementation.  Continue vitamin B12 daily.  Follow up in 6 months for labs MD +/- Venofer    All questions were answered. The patient knows to call the clinic with any problems, questions or concerns.  Time Total: 15 minutes  Greater than 50%  of this time was spent counseling and coordinating care related to the above assessment and plan.  Signed by: Altha Harm, PhD, NP-C [place] [facility] 08/07/2021

## 2021-08-07 NOTE — Telephone Encounter (Signed)
Pt calls triage line and states that she is still having UTI symptoms after completing antibiotics prescribed by her GYN. She requests to drop off a urine specimen. Advised pt that she would need to be seen in office for an office visit with UA and CX. Offered pt 2pm appointment today or tomorrow. Pt declined as she cannot get off work. Pt states she will keep her appt for this coming Monday.

## 2021-08-07 NOTE — Patient Instructions (Signed)

## 2021-08-07 NOTE — Progress Notes (Signed)
Patient here for follow up she reports that she is feeling much better since starting treatment.

## 2021-08-11 ENCOUNTER — Other Ambulatory Visit: Payer: Self-pay

## 2021-08-11 ENCOUNTER — Encounter: Payer: Self-pay | Admitting: Urology

## 2021-08-11 ENCOUNTER — Ambulatory Visit (INDEPENDENT_AMBULATORY_CARE_PROVIDER_SITE_OTHER): Payer: 59 | Admitting: Urology

## 2021-08-11 VITALS — BP 126/82 | HR 90 | Ht 64.0 in | Wt 205.0 lb

## 2021-08-11 DIAGNOSIS — R300 Dysuria: Secondary | ICD-10-CM | POA: Diagnosis not present

## 2021-08-11 DIAGNOSIS — N200 Calculus of kidney: Secondary | ICD-10-CM

## 2021-08-11 DIAGNOSIS — N3000 Acute cystitis without hematuria: Secondary | ICD-10-CM | POA: Diagnosis not present

## 2021-08-11 LAB — URINALYSIS, COMPLETE
Bilirubin, UA: NEGATIVE
Nitrite, UA: POSITIVE — AB
Specific Gravity, UA: 1.02 (ref 1.005–1.030)
Urobilinogen, Ur: 1 mg/dL (ref 0.2–1.0)
pH, UA: 5.5 (ref 5.0–7.5)

## 2021-08-11 LAB — MICROSCOPIC EXAMINATION: RBC, Urine: NONE SEEN /hpf (ref 0–2)

## 2021-08-11 MED ORDER — FLUCONAZOLE 100 MG PO TABS
100.0000 mg | ORAL_TABLET | Freq: Every day | ORAL | 0 refills | Status: DC
  Filled 2021-08-11: qty 1, 1d supply, fill #0

## 2021-08-11 MED ORDER — DOXYCYCLINE HYCLATE 100 MG PO TABS
100.0000 mg | ORAL_TABLET | Freq: Two times a day (BID) | ORAL | 0 refills | Status: DC
  Filled 2021-08-11: qty 14, 7d supply, fill #0

## 2021-08-11 NOTE — Progress Notes (Signed)
08/11/2021 2:27 PM   Elizabeth Good 10-Sep-1965 [id number]  Referring provider: Gayland Curry, MD [address] [place],  [place] [address]  Chief Complaint  Patient presents with   Nephrolithiasis    HPI: 55 y.o. female presents for follow-up of nephrolithiasis.  Recent CT showed bilateral, nonobstructing renal calculi. She is complaining of right groin pain that radiates down the leg.  She did have degenerative disc disease on CT in the L2 region. Requesting shockwave lithotripsy of her right renal calculi Has recently had frequent UTIs.  She has been treated twice since 07/16/2021 and presently has recurrent frequency, urgency and dysuria No fever, chills, nausea or vomiting   PMH: Past Medical History:  Diagnosis Date   Allergy    Anemia    Bartholin cyst 1990   Basal cell carcinoma    Curvature of spine    Family history of adverse reaction to anesthesia    Mother - PONV   GERD (gastroesophageal reflux disease)    High cholesterol    History of bladder infections    History of hiatal hernia    History of kidney infection    History of kidney stones    h/o   Hypertension    Kidney stones    Low serum iron    Meningitis    Age 10   PONV (postoperative nausea and vomiting)    hard to wake up   PVC's (premature ventricular contractions)    with coffee   Renal disorder    Scoliosis    Seizures ([place])    none since 05/1997.  on medication   Wears contact lenses     Surgical History: Past Surgical History:  Procedure Laterality Date   ANTERIOR AND POSTERIOR VAGINAL REPAIR     [place] N/A 08/08/2019   Procedure: UTERINE SEROSA BIOPSIES;  Surgeon: Malachy Mood, MD;  Location: [facility];  Service: Gynecology;  Laterality: N/A;   BLADDER SURGERY     BREAST BIOPSY Left 06/29/2019   left breast stereo bx/ x clip/FOCAL ATYPICAL DUCT HYPERPLASIA WITH MICROCALCIFICATIONS.    BREAST BIOPSY Left 07/14/2019   left breast stereo bx/  coil clip/ neg   BREAST BIOPSY Left 08/11/2019   left breast stereo at [facility]/Atypical lobular hyperplasia [facility]), fibroadenomatoid change with sclerosing adenosis, and focal pseudoangiomatous stromal hyperplasia (PASH) with associated microcalcifications.   BREAST EXCISIONAL BIOPSY Left 09/21/2019   Atypical ductal hyperplasia of left breast   CHOLECYSTECTOMY  2011   COLONOSCOPY WITH PROPOFOL N/A 02/07/2021   Procedure: COLONOSCOPY WITH BIOPSY;  Surgeon: Lucilla Lame, MD;  Location: [place];  Service: Endoscopy;  Laterality: N/A;   COMBINED HYSTEROSCOPY DIAGNOSTIC / D&C  12/06/2014   [facility]   ENDOMETRIAL ABLATION  2011   [facility]  Dr. Amalia Hailey   EXTRACORPOREAL SHOCK WAVE LITHOTRIPSY Right 08/25/2018   Procedure: EXTRACORPOREAL SHOCK WAVE LITHOTRIPSY (ESWL);  Surgeon: Billey Co, MD;  Location: [facility];  Service: Urology;  Laterality: Right;   FOOT SURGERY Left    GANGLION CYST EXCISION     GASTRIC BYPASS  2011   INCONTINENCE SURGERY     LAPAROSCOPIC BILATERAL SALPINGECTOMY  08/08/2019   Procedure: LAPAROSCOPIC BILATERAL SALPINGECTOMY;  Surgeon: Malachy Mood, MD;  Location: [facility];  Service: Gynecology;;   LITHOTRIPSY  2016   OOPHORECTOMY Left 2020   POLYPECTOMY N/A 02/07/2021   Procedure: POLYPECTOMY;  Surgeon: Lucilla Lame, MD;  Location: [place];  Service: Endoscopy;  Laterality: N/A;   RECTAL  SURGERY     SEPTOPLASTY     TONSILLECTOMY      Home Medications:  Allergies as of 08/11/2021       Reactions   Dermabase [albolene]    Other Dermatitis, Itching, Other (See Comments)   DERMABOND; BLISTERING   Latex Rash   Dental appliances, balloons, gloves, condoms   Penicillins Rash   Did it involve swelling of the face/tongue/throat, SOB, or low BP? No Did it involve sudden or severe rash/hives, skin peeling, or any reaction on the inside of your mouth or nose? No Did you need to seek medical attention at a hospital or doctor's office?  No When did it last happen?       If all above answers are "NO", may proceed with cephalosporin use.   Sulfa Antibiotics Rash        Medication List        Accurate as of August 11, 2021  2:27 PM. If you have any questions, ask your nurse or doctor.          cetirizine 10 MG tablet Commonly known as: ZYRTEC Take 10 mg by mouth at bedtime.   dexlansoprazole 60 MG capsule Commonly known as: DEXILANT Take 60 mg by mouth at bedtime.   ibuprofen 600 MG tablet Commonly known as: ADVIL TAKE 1 TABLET (600 MG TOTAL) BY MOUTH EVERY 6 (SIX) HOURS AS NEEDED.   Intrarosa 6.5 MG Inst Generic drug: Prasterone Place 6.5 mg vaginally at bedtime.   LamoTRIgine 200 MG Tb24 24 hour tablet One by mouth twice daily. Dr. Reece Levy brand only.   multivitamin tablet Take 1 tablet by mouth daily.   Red Yeast Rice 600 MG Tabs   triamterene-hydrochlorothiazide 37.5-25 MG capsule Commonly known as: DYAZIDE Take 1 capsule by mouth every morning   Vitamin D3 125 MCG (5000 UT) Tabs Take 5,000 Units by mouth every 7 (seven) days.        Allergies:  Allergies  Allergen Reactions   Dermabase [Albolene]    Other Dermatitis, Itching and Other (See Comments)    DERMABOND; BLISTERING   Latex Rash    Dental appliances, balloons, gloves, condoms   Penicillins Rash    Did it involve swelling of the face/tongue/throat, SOB, or low BP? No Did it involve sudden or severe rash/hives, skin peeling, or any reaction on the inside of your mouth or nose? No Did you need to seek medical attention at a hospital or doctor's office? No When did it last happen?       If all above answers are "NO", may proceed with cephalosporin use.    Sulfa Antibiotics Rash    Family History: Family History  Problem Relation Age of Onset   Bladder Cancer Mother    Prostate cancer Father    Melanoma Father    Breast cancer Cousin     Social History:  reports that she has never smoked. She has never used  smokeless tobacco. She reports current alcohol use. She reports that she does not use drugs.   Physical Exam: BP 126/82   Pulse 90   Ht 5\' 4"  (1.626 m)   Wt 205 lb (93 kg)   LMP 11/29/2017 (Approximate)   BMI 35.19 kg/m   Constitutional:  Alert and oriented, No acute distress. HEENT: [place] AT, moist mucus membranes.  Trachea midline, no masses. Cardiovascular: No clubbing, cyanosis, or edema. Respiratory: Normal respiratory effort, no increased work of breathing. Psychiatric: Normal mood and affect.  Laboratory Data:  Urinalysis Dipstick  nitrite positive/1+ leukocytes/trace blood Microscopy 11-30 WBC, few bacteria  Pertinent Imaging: CT images were personally reviewed and interpreted  CT RENAL STONE STUDY  Narrative CLINICAL DATA:  Flank pain.  Microhematuria.  EXAM: CT ABDOMEN AND PELVIS WITHOUT CONTRAST  TECHNIQUE: Multidetector CT imaging of the abdomen and pelvis was performed following the standard protocol without IV contrast.  COMPARISON:  07/19/2018  FINDINGS: Lower chest: No acute abnormality. Stable 4 mm nodule within the periphery of the right middle lobe compatible with a benign nodule.  Hepatobiliary: No focal liver abnormality is seen. Status post cholecystectomy. No biliary dilatation.  Pancreas: Unremarkable. No pancreatic ductal dilatation or surrounding inflammatory changes.  Spleen: Normal in size without focal abnormality.  Adrenals/Urinary Tract: Normal adrenal glands. There is a stone within the inferior pole of the right kidney measuring 2-3 mm, image 69/4. Within the upper pole of the left kidney there is a 2 mm stone, image 74/4. No mass or hydronephrosis identified bilaterally. No hydroureter or signs of ureteral lithiasis. Decompressed bladder is unremarkable for degree of distension.  Stomach/Bowel: Small hiatal hernia. Postsurgical changes are identified compatible with previous gastric bypass surgery. The appendix is visualized  and appears normal. No bowel wall thickening, inflammation, or distension. Moderate stool burden noted within the colon.  Vascular/Lymphatic: No significant vascular findings are present. No enlarged abdominal or pelvic lymph nodes.  Reproductive: The uterus appears normal. No adnexal mass identified.  Other: No free fluid or fluid collections identified.  Musculoskeletal: Lumbar curvature is convex towards the left. Degenerative disc disease noted at L2-3. No acute or suspicious osseous findings.  IMPRESSION: 1. No acute findings within the abdomen or pelvis. 2. Small bilateral nonobstructing renal calculi. 3. Moderate stool burden noted within the colon. Correlate for any clinical signs or symptoms of constipation.   Electronically Signed By: Kerby Moors M.D. On: 08/01/2021 12:48   Assessment & Plan:    1.  Urinary tract infection Urine culture ordered She has been treated with Macrobid and cefuroxime Rx doxycycline sent pending culture report  2.  Nephrolithiasis We discussed her current symptoms are indicative of neurogenic pain and not kidney stone pain She stated she has had identical pain after prior lithotripsy and her pain has resolved posttreatment If her calculi are visualized on KUB they can be treated however we did discuss the possible risk of lithotripsy including perinephric hematoma with need for transfusion or embolization and some studies indicating worsening hypertension after repeated treatment A KUB was ordered and if the calculi are visualized we will schedule shockwave lithotripsy.  If unable to see she would need ureteroscopy.    Abbie Sons, [place] [address], [place], [place] [address] [phone]

## 2021-08-12 ENCOUNTER — Other Ambulatory Visit: Payer: Self-pay

## 2021-08-12 ENCOUNTER — Encounter: Payer: Self-pay | Admitting: Oncology

## 2021-08-12 ENCOUNTER — Encounter: Payer: Self-pay | Admitting: Urology

## 2021-08-15 ENCOUNTER — Ambulatory Visit
Admission: RE | Admit: 2021-08-15 | Discharge: 2021-08-15 | Disposition: A | Discharge status: Home / Self Care | Payer: 59 | Attending: Urology | Admitting: Urology

## 2021-08-15 ENCOUNTER — Telehealth: Payer: Self-pay | Admitting: *Deleted

## 2021-08-15 ENCOUNTER — Ambulatory Visit
Admission: RE | Admit: 2021-08-15 | Discharge: 2021-08-15 | Disposition: A | Discharge status: Home / Self Care | Payer: 59 | Source: Ambulatory Visit | Attending: Urology | Admitting: Urology

## 2021-08-15 ENCOUNTER — Other Ambulatory Visit: Payer: Self-pay

## 2021-08-15 DIAGNOSIS — Z9049 Acquired absence of other specified parts of digestive tract: Secondary | ICD-10-CM | POA: Diagnosis not present

## 2021-08-15 DIAGNOSIS — M4186 Other forms of scoliosis, lumbar region: Secondary | ICD-10-CM | POA: Diagnosis not present

## 2021-08-15 DIAGNOSIS — N200 Calculus of kidney: Secondary | ICD-10-CM | POA: Insufficient documentation

## 2021-08-15 DIAGNOSIS — Z87442 Personal history of urinary calculi: Secondary | ICD-10-CM | POA: Diagnosis not present

## 2021-08-15 LAB — CULTURE, URINE COMPREHENSIVE

## 2021-08-15 NOTE — Telephone Encounter (Signed)
-----   Message from Abbie Sons, MD sent at 08/15/2021 12:17 PM EST ----- Urine culture + E. coli and sensitive to doxycycline

## 2021-08-15 NOTE — Telephone Encounter (Signed)
Notified patient as instructed, patient pleased °

## 2021-08-18 ENCOUNTER — Encounter: Payer: Self-pay | Admitting: *Deleted

## 2021-08-18 ENCOUNTER — Other Ambulatory Visit: Payer: Self-pay | Admitting: *Deleted

## 2021-08-18 DIAGNOSIS — N200 Calculus of kidney: Secondary | ICD-10-CM

## 2021-08-20 ENCOUNTER — Ambulatory Visit
Admission: RE | Admit: 2021-08-20 | Discharge: 2021-08-20 | Disposition: A | Discharge status: Home / Self Care | Payer: 59 | Source: Ambulatory Visit | Attending: Urology | Admitting: Urology

## 2021-08-20 ENCOUNTER — Ambulatory Visit
Admission: RE | Admit: 2021-08-20 | Discharge: 2021-08-20 | Disposition: A | Discharge status: Home / Self Care | Payer: 59 | Attending: Urology | Admitting: Urology

## 2021-08-20 DIAGNOSIS — M4306 Spondylolysis, lumbar region: Secondary | ICD-10-CM | POA: Diagnosis not present

## 2021-08-20 DIAGNOSIS — N200 Calculus of kidney: Secondary | ICD-10-CM | POA: Insufficient documentation

## 2021-08-20 DIAGNOSIS — M9905 Segmental and somatic dysfunction of pelvic region: Secondary | ICD-10-CM | POA: Diagnosis not present

## 2021-08-20 DIAGNOSIS — M5431 Sciatica, right side: Secondary | ICD-10-CM | POA: Diagnosis not present

## 2021-08-20 DIAGNOSIS — M9903 Segmental and somatic dysfunction of lumbar region: Secondary | ICD-10-CM | POA: Diagnosis not present

## 2021-08-20 DIAGNOSIS — R109 Unspecified abdominal pain: Secondary | ICD-10-CM | POA: Diagnosis not present

## 2021-08-21 ENCOUNTER — Encounter: Payer: Self-pay | Admitting: *Deleted

## 2021-08-29 ENCOUNTER — Encounter: Payer: Self-pay | Admitting: Physician Assistant

## 2021-08-29 ENCOUNTER — Ambulatory Visit: Payer: 59 | Admitting: Physician Assistant

## 2021-08-29 ENCOUNTER — Other Ambulatory Visit: Payer: Self-pay

## 2021-08-29 VITALS — BP 125/77 | HR 98 | Ht 65.0 in | Wt 205.0 lb

## 2021-08-29 DIAGNOSIS — R300 Dysuria: Secondary | ICD-10-CM | POA: Diagnosis not present

## 2021-08-29 LAB — URINALYSIS, COMPLETE
Bilirubin, UA: NEGATIVE
Glucose, UA: NEGATIVE
Ketones, UA: NEGATIVE
Nitrite, UA: NEGATIVE
Protein,UA: NEGATIVE
Specific Gravity, UA: 1.015 (ref 1.005–1.030)
Urobilinogen, Ur: 0.2 mg/dL (ref 0.2–1.0)
pH, UA: 7 (ref 5.0–7.5)

## 2021-08-29 LAB — MICROSCOPIC EXAMINATION
Bacteria, UA: NONE SEEN
WBC, UA: >30 /hpf — AB (ref 0–5)

## 2021-08-29 MED ORDER — CIPROFLOXACIN HCL 250 MG PO TABS
250.0000 mg | ORAL_TABLET | Freq: Two times a day (BID) | ORAL | 0 refills | Status: AC
Start: 2021-08-29 — End: 2021-09-05
  Filled 2021-08-29: qty 14, 7d supply, fill #0

## 2021-08-29 MED ORDER — FLUCONAZOLE 150 MG PO TABS
150.0000 mg | ORAL_TABLET | Freq: Once | ORAL | 0 refills | Status: AC
  Filled 2021-08-29: qty 1, 1d supply, fill #0

## 2021-08-29 NOTE — Progress Notes (Signed)
08/29/2021 2:42 PM   Elizabeth Good December 05, 1965 [id number]  CC: Chief Complaint  Patient presents with   Dysuria   HPI: Elizabeth Good is a 55 y.o. female with recurrent stone disease who presents today for evaluation of possible UTI.   Today she reports a 1 day history of malodorous urine and dysuria.  She took Azo, which relieved her symptoms.  She notes some nausea and a low grade fever, T-max 99.5 F, this morning.  She denies flank pain or gross hematuria.  Her previously reported groin pain when she saw Dr. Bernardo Heater earlier this month is since resolved.  She thinks she may have passed a stone.  In-office UA today positive for trace intact blood and 3+ leukocyte esterase; urine microscopy with >30 WBCs/HPF and amorphous crystals.  PMH: Past Medical History:  Diagnosis Date   Allergy    Anemia    Bartholin cyst 1990   Basal cell carcinoma    Curvature of spine    Family history of adverse reaction to anesthesia    Mother - PONV   GERD (gastroesophageal reflux disease)    High cholesterol    History of bladder infections    History of hiatal hernia    History of kidney infection    History of kidney stones    h/o   Hypertension    Kidney stones    Low serum iron    Meningitis    Age 66   PONV (postoperative nausea and vomiting)    hard to wake up   PVC's (premature ventricular contractions)    with coffee   Renal disorder    Scoliosis    Seizures ([place])    none since 05/1997.  on medication   Wears contact lenses     Surgical History: Past Surgical History:  Procedure Laterality Date   ANTERIOR AND POSTERIOR VAGINAL REPAIR     [place] N/A 08/08/2019   Procedure: UTERINE SEROSA BIOPSIES;  Surgeon: Malachy Mood, MD;  Location: [facility];  Service: Gynecology;  Laterality: N/A;   BLADDER SURGERY     BREAST BIOPSY Left 06/29/2019   left breast stereo bx/ x clip/FOCAL ATYPICAL DUCT HYPERPLASIA WITH MICROCALCIFICATIONS.     BREAST BIOPSY Left 07/14/2019   left breast stereo bx/ coil clip/ neg   BREAST BIOPSY Left 08/11/2019   left breast stereo at [facility]/Atypical lobular hyperplasia [facility]), fibroadenomatoid change with sclerosing adenosis, and focal pseudoangiomatous stromal hyperplasia (PASH) with associated microcalcifications.   BREAST EXCISIONAL BIOPSY Left 09/21/2019   Atypical ductal hyperplasia of left breast   CHOLECYSTECTOMY  2011   COLONOSCOPY WITH PROPOFOL N/A 02/07/2021   Procedure: COLONOSCOPY WITH BIOPSY;  Surgeon: Lucilla Lame, MD;  Location: [place];  Service: Endoscopy;  Laterality: N/A;   COMBINED HYSTEROSCOPY DIAGNOSTIC / D&C  12/06/2014   [facility]   ENDOMETRIAL ABLATION  2011   [facility]  Dr. Amalia Hailey   EXTRACORPOREAL SHOCK WAVE LITHOTRIPSY Right 08/25/2018   Procedure: EXTRACORPOREAL SHOCK WAVE LITHOTRIPSY (ESWL);  Surgeon: Billey Co, MD;  Location: [facility];  Service: Urology;  Laterality: Right;   FOOT SURGERY Left    GANGLION CYST EXCISION     GASTRIC BYPASS  2011   INCONTINENCE SURGERY     LAPAROSCOPIC BILATERAL SALPINGECTOMY  08/08/2019   Procedure: LAPAROSCOPIC BILATERAL SALPINGECTOMY;  Surgeon: Malachy Mood, MD;  Location: [facility];  Service: Gynecology;;   LITHOTRIPSY  2016   OOPHORECTOMY Left 2020   POLYPECTOMY N/A 02/07/2021  Procedure: POLYPECTOMY;  Surgeon: Lucilla Lame, MD;  Location: [place];  Service: Endoscopy;  Laterality: N/A;   RECTAL SURGERY     SEPTOPLASTY     TONSILLECTOMY      Home Medications:  Allergies as of 08/29/2021       Reactions   Dermabase [albolene]    Other Dermatitis, Itching, Other (See Comments)   DERMABOND; BLISTERING   Latex Rash   Dental appliances, balloons, gloves, condoms   Penicillins Rash   Did it involve swelling of the face/tongue/throat, SOB, or low BP? No Did it involve sudden or severe rash/hives, skin peeling, or any reaction on the inside of your mouth or nose? No Did you need to seek  medical attention at a hospital or doctor's office? No When did it last happen?       If all above answers are NO, may proceed with cephalosporin use.   Sulfa Antibiotics Rash        Medication List        Accurate as of August 29, 2021  2:42 PM. If you have any questions, ask your nurse or doctor.          STOP taking these medications    doxycycline 100 MG tablet Commonly known as: VIBRA-TABS Stopped by: Debroah Loop, PA-C       TAKE these medications    cetirizine 10 MG tablet Commonly known as: ZYRTEC Take 10 mg by mouth at bedtime.   ciprofloxacin 250 MG tablet Commonly known as: CIPRO Take 1 tablet (250 mg total) by mouth 2 (two) times daily for 7 days. Started by: Debroah Loop, PA-C   dexlansoprazole 60 MG capsule Commonly known as: DEXILANT Take 60 mg by mouth at bedtime.   fluconazole 150 MG tablet Commonly known as: DIFLUCAN Take 1 tablet (150 mg total) by mouth once for 1 dose. What changed:  medication strength how much to take when to take this Changed by: Debroah Loop, PA-C   ibuprofen 600 MG tablet Commonly known as: ADVIL TAKE 1 TABLET (600 MG TOTAL) BY MOUTH EVERY 6 (SIX) HOURS AS NEEDED.   Intrarosa 6.5 MG Inst Generic drug: Prasterone Place 6.5 mg vaginally at bedtime.   LamoTRIgine 200 MG Tb24 24 hour tablet One by mouth twice daily. Dr. Reece Levy brand only.   multivitamin tablet Take 1 tablet by mouth daily.   Red Yeast Rice 600 MG Tabs   triamterene-hydrochlorothiazide 37.5-25 MG capsule Commonly known as: DYAZIDE Take 1 capsule by mouth every morning   Vitamin D3 125 MCG (5000 UT) Tabs Take 5,000 Units by mouth every 7 (seven) days.        Allergies:  Allergies  Allergen Reactions   Dermabase [Albolene]    Other Dermatitis, Itching and Other (See Comments)    DERMABOND; BLISTERING   Latex Rash    Dental appliances, balloons, gloves, condoms   Penicillins Rash    Did it involve  swelling of the face/tongue/throat, SOB, or low BP? No Did it involve sudden or severe rash/hives, skin peeling, or any reaction on the inside of your mouth or nose? No Did you need to seek medical attention at a hospital or doctor's office? No When did it last happen?       If all above answers are NO, may proceed with cephalosporin use.    Sulfa Antibiotics Rash    Family History: Family History  Problem Relation Age of Onset   Bladder Cancer Mother    Prostate cancer Father  Melanoma Father    Breast cancer Cousin     Social History:   reports that she has never smoked. She has never used smokeless tobacco. She reports current alcohol use. She reports that she does not use drugs.  Physical Exam: BP 125/77    Pulse 98    Ht 5\' 5"  (1.651 m)    Wt 205 lb (93 kg)    LMP 11/29/2017 (Approximate)    BMI 34.11 kg/m   Constitutional:  Alert and oriented, no acute distress, nontoxic appearing HEENT: [place], AT Cardiovascular: No clubbing, cyanosis, or edema Respiratory: Normal respiratory effort, no increased work of breathing Skin: No rashes, bruises or suspicious lesions Neurologic: Grossly intact, no focal deficits, moving all 4 extremities Psychiatric: Normal mood and affect  Laboratory Data: Results for orders placed or performed in visit on 08/29/21  Mycoplasma / ureaplasma culture   Specimen: Urine   UR  Result Value Ref Range   Ureaplasma urealyticum Comment Negative   Mycoplasma hominis Culture Comment Negative  Microscopic Examination   Urine  Result Value Ref Range   WBC, UA >30 (A) 0 - 5 /hpf   RBC 0-2 0 - 2 /hpf   Epithelial Cells (non renal) 0-10 0 - 10 /hpf   Crystals Present (A) N/A   Crystal Type Amorphous Sediment N/A   Bacteria, UA None seen None seen/Few  Urinalysis, Complete  Result Value Ref Range   Specific Gravity, UA 1.015 1.005 - 1.030   pH, UA 7.0 5.0 - 7.5   Color, UA Yellow Yellow   Appearance Ur Cloudy (A) Clear   Leukocytes,UA 3+ (A)  Negative   Protein,UA Negative Negative/Trace   Glucose, UA Negative Negative   Ketones, UA Negative Negative   RBC, UA Trace (A) Negative   Bilirubin, UA Negative Negative   Urobilinogen, Ur 0.2 0.2 - 1.0 mg/dL   Nitrite, UA Negative Negative   Microscopic Examination See below:    Assessment & Plan:   1. Dysuria UA today notable for pyuria, low concern for acute stone episode today.  Will start empiric Cipro x7 days given her recently recurrent infections and send for standard and atypical culture.  Patient request Diflucan as well, so I have filled this for her. - Urinalysis, Complete - ciprofloxacin (CIPRO) 250 MG tablet; Take 1 tablet (250 mg total) by mouth 2 (two) times daily for 7 days.  Dispense: 14 tablet; Refill: 0 - fluconazole (DIFLUCAN) 150 MG tablet; Take 1 tablet (150 mg total) by mouth once for 1 dose.  Dispense: 1 tablet; Refill: 0 - CULTURE, URINE COMPREHENSIVE - Mycoplasma / ureaplasma culture   Return if symptoms worsen or fail to improve.  Debroah Loop, PA-C  [facility] [address] [place],  [address] [phone]

## 2021-09-02 ENCOUNTER — Encounter: Payer: Self-pay | Admitting: Obstetrics and Gynecology

## 2021-09-02 ENCOUNTER — Ambulatory Visit (INDEPENDENT_AMBULATORY_CARE_PROVIDER_SITE_OTHER): Payer: 59 | Admitting: Obstetrics and Gynecology

## 2021-09-02 ENCOUNTER — Other Ambulatory Visit: Payer: Self-pay

## 2021-09-02 ENCOUNTER — Other Ambulatory Visit (HOSPITAL_COMMUNITY)
Admission: RE | Admit: 2021-09-02 | Discharge: 2021-09-02 | Disposition: A | Discharge status: Home / Self Care | Payer: 59 | Source: Ambulatory Visit | Attending: Obstetrics and Gynecology | Admitting: Obstetrics and Gynecology

## 2021-09-02 VITALS — BP 130/76 | Ht 65.0 in | Wt 209.0 lb

## 2021-09-02 DIAGNOSIS — R87618 Other abnormal cytological findings on specimens from cervix uteri: Secondary | ICD-10-CM

## 2021-09-02 DIAGNOSIS — R87612 Low grade squamous intraepithelial lesion on cytologic smear of cervix (LGSIL): Secondary | ICD-10-CM | POA: Diagnosis not present

## 2021-09-02 DIAGNOSIS — N870 Mild cervical dysplasia: Secondary | ICD-10-CM | POA: Diagnosis not present

## 2021-09-02 DIAGNOSIS — N72 Inflammatory disease of cervix uteri: Secondary | ICD-10-CM | POA: Diagnosis not present

## 2021-09-03 LAB — CULTURE, URINE COMPREHENSIVE

## 2021-09-03 NOTE — Progress Notes (Signed)
° °  GYNECOLOGY CLINIC COLPOSCOPY PROCEDURE NOTE  56 y.o. 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 Discussed underlying role for HPV infection in the development of cervical dysplasia, its natural history and progression/regression, need for surveillance.  Is the patient  pregnant: No LMP: Patient's last menstrual period was 11/29/2017 (approximate). Smoking status:  reports that she has never smoked. She has never used smokeless tobacco. Contraception: post menopausal status  Patient given informed consent, signed copy in the chart, time out was performed.  The patient was position in dorsal lithotomy position. Speculum was placed the cervix was visualized.   After application of acetic acid colposcopic inspection of the cervix was undertaken.   Colposcopy adequate, full visualization of transformation zone: Yes no visible lesions; corresponding biopsies obtained.  Random 12 O'Clock biopsy ECC specimen obtained:  Yes  All specimens were labeled and sent to pathology.   Patient was given post procedure instructions.  Will follow up pathology and manage accordingly.  Routine preventative health maintenance measures emphasized.  OBGyn Exam  Malachy Mood, MD, [facility], [facility]

## 2021-09-04 ENCOUNTER — Encounter: Payer: Self-pay | Admitting: Obstetrics and Gynecology

## 2021-09-04 ENCOUNTER — Telehealth: Payer: Self-pay

## 2021-09-04 LAB — MYCOPLASMA / UREAPLASMA CULTURE
Mycoplasma hominis Culture: NEGATIVE
Ureaplasma urealyticum: NEGATIVE

## 2021-09-04 LAB — SURGICAL PATHOLOGY

## 2021-09-04 NOTE — Telephone Encounter (Signed)
-----   Message from Nori Riis, PA-C sent at 09/04/2021  8:19 AM EST ----- Please let Elizabeth Good know that her urine culture was positive for infection and the Cipro that was prescribed for her is the appropriate antibiotic and to finish the entire prescription.  She needs to contact us if she is not feeling better.

## 2021-09-04 NOTE — Telephone Encounter (Signed)
Notified patient as advised, patient expressed understanding. Pt reports she is still experiencing some mild dysuria w/ 2 days of ABX remaining. Advised patient she will need to finish full prescription and call the office if she is still having sx after finishing the full pack.

## 2021-09-10 DIAGNOSIS — M9903 Segmental and somatic dysfunction of lumbar region: Secondary | ICD-10-CM | POA: Diagnosis not present

## 2021-09-10 DIAGNOSIS — M5431 Sciatica, right side: Secondary | ICD-10-CM | POA: Diagnosis not present

## 2021-09-10 DIAGNOSIS — M4306 Spondylolysis, lumbar region: Secondary | ICD-10-CM | POA: Diagnosis not present

## 2021-09-10 DIAGNOSIS — M9905 Segmental and somatic dysfunction of pelvic region: Secondary | ICD-10-CM | POA: Diagnosis not present

## 2021-09-23 DIAGNOSIS — M9903 Segmental and somatic dysfunction of lumbar region: Secondary | ICD-10-CM | POA: Diagnosis not present

## 2021-09-23 DIAGNOSIS — M4306 Spondylolysis, lumbar region: Secondary | ICD-10-CM | POA: Diagnosis not present

## 2021-09-23 DIAGNOSIS — M9905 Segmental and somatic dysfunction of pelvic region: Secondary | ICD-10-CM | POA: Diagnosis not present

## 2021-09-23 DIAGNOSIS — M5431 Sciatica, right side: Secondary | ICD-10-CM | POA: Diagnosis not present

## 2021-09-26 ENCOUNTER — Other Ambulatory Visit: Payer: Self-pay

## 2021-10-02 ENCOUNTER — Other Ambulatory Visit: Payer: Self-pay

## 2021-10-03 ENCOUNTER — Other Ambulatory Visit: Payer: Self-pay

## 2021-10-06 ENCOUNTER — Other Ambulatory Visit: Payer: Self-pay

## 2021-10-06 DIAGNOSIS — M5431 Sciatica, right side: Secondary | ICD-10-CM | POA: Diagnosis not present

## 2021-10-06 DIAGNOSIS — M9903 Segmental and somatic dysfunction of lumbar region: Secondary | ICD-10-CM | POA: Diagnosis not present

## 2021-10-06 DIAGNOSIS — M9905 Segmental and somatic dysfunction of pelvic region: Secondary | ICD-10-CM | POA: Diagnosis not present

## 2021-10-06 DIAGNOSIS — M4306 Spondylolysis, lumbar region: Secondary | ICD-10-CM | POA: Diagnosis not present

## 2021-10-15 DIAGNOSIS — M5431 Sciatica, right side: Secondary | ICD-10-CM | POA: Diagnosis not present

## 2021-10-15 DIAGNOSIS — M4306 Spondylolysis, lumbar region: Secondary | ICD-10-CM | POA: Diagnosis not present

## 2021-10-15 DIAGNOSIS — M9903 Segmental and somatic dysfunction of lumbar region: Secondary | ICD-10-CM | POA: Diagnosis not present

## 2021-10-15 DIAGNOSIS — M9905 Segmental and somatic dysfunction of pelvic region: Secondary | ICD-10-CM | POA: Diagnosis not present

## 2021-10-29 ENCOUNTER — Other Ambulatory Visit: Payer: Self-pay | Admitting: *Deleted

## 2021-10-29 DIAGNOSIS — D509 Iron deficiency anemia, unspecified: Secondary | ICD-10-CM

## 2021-11-03 ENCOUNTER — Telehealth: Payer: Self-pay | Admitting: *Deleted

## 2021-11-03 DIAGNOSIS — M5431 Sciatica, right side: Secondary | ICD-10-CM | POA: Diagnosis not present

## 2021-11-03 DIAGNOSIS — M9903 Segmental and somatic dysfunction of lumbar region: Secondary | ICD-10-CM | POA: Diagnosis not present

## 2021-11-03 DIAGNOSIS — M9905 Segmental and somatic dysfunction of pelvic region: Secondary | ICD-10-CM | POA: Diagnosis not present

## 2021-11-03 DIAGNOSIS — M4306 Spondylolysis, lumbar region: Secondary | ICD-10-CM | POA: Diagnosis not present

## 2021-11-03 NOTE — Telephone Encounter (Signed)
Patient needs to reschedule appointment. ?

## 2021-11-05 ENCOUNTER — Other Ambulatory Visit: Payer: 59

## 2021-11-06 ENCOUNTER — Ambulatory Visit: Payer: 59

## 2021-11-06 ENCOUNTER — Ambulatory Visit: Payer: 59 | Admitting: Oncology

## 2021-11-12 ENCOUNTER — Encounter: Payer: Self-pay | Admitting: Oncology

## 2021-11-18 ENCOUNTER — Telehealth: Payer: Self-pay

## 2021-11-18 ENCOUNTER — Other Ambulatory Visit: Payer: Self-pay

## 2021-11-18 DIAGNOSIS — D509 Iron deficiency anemia, unspecified: Secondary | ICD-10-CM

## 2021-11-18 NOTE — Telephone Encounter (Signed)
Please schedule patient for Labs on Thursday. Please let patient know. Thanks ?

## 2021-11-19 ENCOUNTER — Other Ambulatory Visit: Payer: Self-pay

## 2021-11-19 NOTE — Telephone Encounter (Signed)
Can you schedule patient for labs tomorrow. Please let patient know. Thanks  ?

## 2021-11-20 ENCOUNTER — Other Ambulatory Visit: Payer: Self-pay

## 2021-11-20 ENCOUNTER — Inpatient Hospital Stay: Payer: 59 | Attending: Oncology

## 2021-11-20 DIAGNOSIS — Z9884 Bariatric surgery status: Secondary | ICD-10-CM

## 2021-11-20 DIAGNOSIS — D508 Other iron deficiency anemias: Secondary | ICD-10-CM | POA: Insufficient documentation

## 2021-11-20 DIAGNOSIS — D509 Iron deficiency anemia, unspecified: Secondary | ICD-10-CM

## 2021-11-20 LAB — CBC WITH DIFFERENTIAL/PLATELET
Abs Immature Granulocytes: 0.03 10*3/uL (ref 0.00–0.07)
Basophils Absolute: 0 10*3/uL (ref 0.0–0.1)
Basophils Relative: 1 %
Eosinophils Absolute: 0.4 10*3/uL (ref 0.0–0.5)
Eosinophils Relative: 5 %
HCT: 42.7 % (ref 36.0–46.0)
Hemoglobin: 14.2 g/dL (ref 12.0–15.0)
Immature Granulocytes: 0 %
Lymphocytes Relative: 25 %
Lymphs Abs: 1.9 10*3/uL (ref 0.7–4.0)
MCH: 29.8 pg (ref 26.0–34.0)
MCHC: 33.3 g/dL (ref 30.0–36.0)
MCV: 89.5 fL (ref 80.0–100.0)
Monocytes Absolute: 0.5 10*3/uL (ref 0.1–1.0)
Monocytes Relative: 7 %
Neutro Abs: 4.7 10*3/uL (ref 1.7–7.7)
Neutrophils Relative %: 62 %
Platelets: 385 10*3/uL (ref 150–400)
RBC: 4.77 MIL/uL (ref 3.87–5.11)
RDW: 12.1 % (ref 11.5–15.5)
WBC: 7.6 10*3/uL (ref 4.0–10.5)
nRBC: 0 % (ref 0.0–0.2)

## 2021-11-20 LAB — IRON AND TIBC
Iron: 85 ug/dL (ref 28–170)
Saturation Ratios: 22 % (ref 10.4–31.8)
TIBC: 389 ug/dL (ref 250–450)
UIBC: 304 ug/dL

## 2021-11-20 LAB — VITAMIN B12: Vitamin B-12: 1298 pg/mL — ABNORMAL HIGH (ref 180–914)

## 2021-11-20 LAB — FERRITIN: Ferritin: 18 ng/mL (ref 11–307)

## 2021-11-26 ENCOUNTER — Telehealth: Payer: Self-pay | Admitting: *Deleted

## 2021-11-26 NOTE — Telephone Encounter (Signed)
Patient does want IV iron and is requesting next Friday 4/7 at 1 or 130 ?

## 2021-11-26 NOTE — Telephone Encounter (Signed)
Per Dr. Tasia Catchings, labs looked good. Her Iron, hemoglobin levels are good. She offers her IV iron x 1. Please keep appt as scheduled. If the infusion doesn't help with how she has been feeling, then she will need to reach out to her PCP. Please let her know  ?

## 2021-11-26 NOTE — Telephone Encounter (Signed)
Patient called stating that she had labs drawn last week and she is asking for results and if she is going to get an iron infusion. Please advise. ? ?Ferritin ?Order: [id number] ?Status: Final result    ?Visible to patient: Yes (seen)    ?Next appt: 01/05/2022 at 01:15 PM in Oncology ([facility])    ?Dx: Iron deficiency anemia, unspecified i...    ?0 Result Notes ?        ?Component Ref Range & Units 6 d ago ?(11/20/21) 3 mo ago ?(08/06/21) 3 yr ago ?(04/25/18) 4 yr ago ?(07/29/17) 4 yr ago ?(04/29/17)  ?Ferritin 11 - 307 ng/mL 18  13 CM  13 CM  41  63   ?Comment: Performed at [facility], [address] [place], [place] [address]  ?Resulting Agency  [facility] [place] [facility] [place] [facility] [place] [facility]  ?  ? ?  ?  ?Specimen Collected: 11/20/21 14:39 Last Resulted: 11/20/21 15:54  ?  ?  Lab Flowsheet   ? Order Details   ? View Encounter   ? Lab and Collection Details   ? Routing   ? Result History    ?View Encounter Conversation    ?  ?CM=Additional comments    ?  ?Result Care Coordination ? ? ?Patient Communication ? ? Add Comments   Seen Back to Top  ?  ?  ? ?Other Results from 11/20/2021 ? ? Contains abnormal data Vitamin B12 ?Order: [id number] ?Status: Final result    ?Visible to patient: Yes (seen)    ?Next appt: 01/05/2022 at 01:15 PM in Oncology ([facility])    ?Dx: Hx of gastric bypass; Iron deficiency...    ?0 Result Notes ?      ?Component Ref Range & Units 6 d ago 3 mo ago 3 yr ago  ?Vitamin B-12 180 - 914 pg/mL 1,298 High   1,874 High  CM  645 CM   ?Comment: (NOTE)  ?This assay is not validated for testing neonatal or  ?myeloproliferative syndrome specimens for Vitamin B12 levels.  ?Performed at [facility], [place] [address], [place], [place]  ?[address]   ?Resulting Agency  [place] [facility] [place] [facility] [place] [facility]  ?  ? ?  ?  ?Specimen Collected: 11/20/21 14:40 Last Resulted: 11/20/21 21:25  ?  ?  Lab Flowsheet   ? Order Details   ? View Encounter   ? Lab and Collection Details   ? Routing    ? Result History    ?View Encounter Conversation    ?  ?CM=Additional comments    ?  ?Result Care Coordination ? ? ?Patient Communication ? ? Add Comments   Seen Back to Top  ?  ?  ? ?  ?Iron and TIBC ?Order: [id number] ?Status: Final result    ?Visible to patient: Yes (seen)    ?Next appt: 01/05/2022 at 01:15 PM in Oncology ([facility])    ?Dx: Iron deficiency anemia, unspecified i...    ?0 Result Notes ?       ?Component Ref Range & Units 6 d ago ?(11/20/21) 3 yr ago ?(04/25/18) 4 yr ago ?(07/29/17) 4 yr ago ?(04/29/17)  ?Iron 28 - 170 ug/dL 85  110  126  111   ?TIBC 250 - 450 ug/dL 389  375  326  315   ?Saturation Ratios 10.4 - 31.8 % 22  29  39 High   35 High    ?  UIBC ug/dL 304  265 CM  201  204   ?Comment: Performed at [facility], [address] [place], [place] [address]  ?Resulting Agency  [place] [facility] [place] [facility] [place] [facility] [place] [facility]  ?  ? ?  ?  ?Specimen Collected: 11/20/21 14:39 Last Resulted: 11/20/21 15:54  ?  ?  Lab Flowsheet   ? Order Details   ? View Encounter   ? Lab and Collection Details   ? Routing   ? Result History    ?View Encounter Conversation    ?  ?CM=Additional comments    ?  ?Result Care Coordination ? ? ?Patient Communication ? ? Add Comments   Seen Back to Top  ?  ?  ? ?  ?CBC with Differential/Platelet ?Order: [id number] ?Status: Final result    ?Visible to patient: Yes (seen)    ?Next appt: 01/05/2022 at 01:15 PM in Oncology ([facility])    ?Dx: Iron deficiency anemia, unspecified i...    ?0 Result Notes ?          ?Component Ref Range & Units 6 d ago ?(11/20/21) 2 mo ago ?(08/29/21) 3 mo ago ?(08/11/21) 3 mo ago ?(08/06/21) 4 mo ago ?(07/16/21) 6 mo ago ?(05/08/21) 1 yr ago ?(06/10/20)  ?WBC 4.0 - 10.5 K/uL 7.6    8.9   8.1    ?RBC 3.87 - 5.11 MIL/uL 4.77  0-2 R  None seen R  4.89  0-2 R  4.29  0-2 R   ?Hemoglobin 12.0 - 15.0 g/dL 14.2    13.7   10.7 Low     ?HCT 36.0 - 46.0 % 42.7    42.0   33.0 Low     ?MCV 80.0 - 100.0 fL 89.5    85.9   76.9 Low     ?MCH 26.0 - 34.0 pg  29.8    28.0   24.9 Low     ?MCHC 30.0 - 36.0 g/dL 33.3    32.6   32.4    ?RDW 11.5 - 15.5 % 12.1    14.9   15.6 High     ?Platelets 150 - 400 K/uL 385    361   400    ?nRBC 0.0 - 0.2 % 0.0    0.0   0.0    ?Neutrophils Relative % % 62    64   61    ?Neutro Abs 1.7 - 7.7 K/uL 4.7    5.7   4.9    ?Lymphocytes Relative % '25    25   27    '$ ?Lymphs Abs 0.7 - 4.0 K/uL 1.9    2.2   2.2    ?Monocytes Relative % '7    6   8    '$ ?Monocytes Absolute 0.1 - 1.0 K/uL 0.5    0.5   0.6    ?Eosinophils Relative % '5    4   3    '$ ?Eosinophils Absolute 0.0 - 0.5 K/uL 0.4    0.4   0.3    ?Basophils Relative % '1    1   1    '$ ?Basophils Absolute 0.0 - 0.1 K/uL 0.0    0.1   0.1    ?Immature Granulocytes % 0    0   0    ?Abs Immature Granulocytes 0.00 - 0.07 K/uL 0.03    0.02 CM   0.02 CM    ?Comment: Performed at [facility], [address]  [place] [place], [place] [address]  ?WBC, UA   >30 Abnormal  R  11-30 Abnormal  R   >30 Abnormal  R   0-5 R   ?Epithelial Cells (non renal)   0-10 R  0-10 R   0-10 R   0-10 R   ?Crystals   Present Abnormal  R        ?Crystal Type   Amorphous Sediment R        ?Bacteria, UA   None seen R  Few Abnormal  R   Many Abnormal  R   None seen R   ?Resulting Agency  [facility] [facility] [facility] [place] [facility]  ?  ? ?  ?  ?Specimen Collected: 11/20/21 14:39 Last Resulted: 11/20/21 14:  ?  ?  ? ?

## 2021-11-27 ENCOUNTER — Encounter: Payer: Self-pay | Admitting: Oncology

## 2021-11-27 ENCOUNTER — Other Ambulatory Visit: Payer: Self-pay

## 2021-12-01 DIAGNOSIS — M4306 Spondylolysis, lumbar region: Secondary | ICD-10-CM | POA: Diagnosis not present

## 2021-12-01 DIAGNOSIS — M9903 Segmental and somatic dysfunction of lumbar region: Secondary | ICD-10-CM | POA: Diagnosis not present

## 2021-12-01 DIAGNOSIS — M9905 Segmental and somatic dysfunction of pelvic region: Secondary | ICD-10-CM | POA: Diagnosis not present

## 2021-12-01 DIAGNOSIS — M5431 Sciatica, right side: Secondary | ICD-10-CM | POA: Diagnosis not present

## 2021-12-02 ENCOUNTER — Other Ambulatory Visit: Payer: Self-pay

## 2021-12-05 ENCOUNTER — Inpatient Hospital Stay: Payer: 59 | Attending: Oncology

## 2021-12-05 VITALS — BP 116/76 | HR 77 | Temp 97.8°F | Resp 17

## 2021-12-05 DIAGNOSIS — D508 Other iron deficiency anemias: Secondary | ICD-10-CM | POA: Diagnosis not present

## 2021-12-05 DIAGNOSIS — D509 Iron deficiency anemia, unspecified: Secondary | ICD-10-CM

## 2021-12-05 MED ORDER — SODIUM CHLORIDE 0.9 % IV SOLN: 200.0000 mg | Freq: Once | INTRAVENOUS | Status: DC

## 2021-12-05 MED ORDER — IRON SUCROSE 20 MG/ML IV SOLN
200.0000 mg | Freq: Once | INTRAVENOUS | Status: AC
  Administered 2021-12-05: 200 mg via INTRAVENOUS
  Filled 2021-12-05: qty 10

## 2021-12-05 MED ORDER — SODIUM CHLORIDE 0.9 % IV SOLN
Freq: Once | INTRAVENOUS | Status: AC
  Filled 2021-12-05: qty 250

## 2021-12-05 NOTE — Progress Notes (Signed)
Patient tolerated Venofer infusion well, no concerns voiced. Patient stable at discharge. AVS given.   ?

## 2021-12-29 DIAGNOSIS — M9903 Segmental and somatic dysfunction of lumbar region: Secondary | ICD-10-CM | POA: Diagnosis not present

## 2021-12-29 DIAGNOSIS — M5431 Sciatica, right side: Secondary | ICD-10-CM | POA: Diagnosis not present

## 2021-12-29 DIAGNOSIS — M4306 Spondylolysis, lumbar region: Secondary | ICD-10-CM | POA: Diagnosis not present

## 2021-12-29 DIAGNOSIS — M9905 Segmental and somatic dysfunction of pelvic region: Secondary | ICD-10-CM | POA: Diagnosis not present

## 2022-01-01 ENCOUNTER — Telehealth: Payer: Self-pay | Admitting: Oncology

## 2022-01-01 NOTE — Telephone Encounter (Signed)
Pt called in requesting to r/s appt to July, pt states that she had inf in April and does not feel like next appt is needed this soon. ?

## 2022-01-05 ENCOUNTER — Inpatient Hospital Stay: Payer: 59

## 2022-01-07 ENCOUNTER — Ambulatory Visit: Payer: 59 | Admitting: Oncology

## 2022-01-07 ENCOUNTER — Ambulatory Visit: Payer: 59

## 2022-01-29 ENCOUNTER — Other Ambulatory Visit: Payer: Self-pay

## 2022-01-30 ENCOUNTER — Other Ambulatory Visit: Payer: Self-pay

## 2022-02-02 DIAGNOSIS — M4306 Spondylolysis, lumbar region: Secondary | ICD-10-CM | POA: Diagnosis not present

## 2022-02-02 DIAGNOSIS — M5431 Sciatica, right side: Secondary | ICD-10-CM | POA: Diagnosis not present

## 2022-02-02 DIAGNOSIS — M9905 Segmental and somatic dysfunction of pelvic region: Secondary | ICD-10-CM | POA: Diagnosis not present

## 2022-02-02 DIAGNOSIS — M9903 Segmental and somatic dysfunction of lumbar region: Secondary | ICD-10-CM | POA: Diagnosis not present

## 2022-02-13 DIAGNOSIS — M5431 Sciatica, right side: Secondary | ICD-10-CM | POA: Diagnosis not present

## 2022-02-13 DIAGNOSIS — M4306 Spondylolysis, lumbar region: Secondary | ICD-10-CM | POA: Diagnosis not present

## 2022-02-13 DIAGNOSIS — M9905 Segmental and somatic dysfunction of pelvic region: Secondary | ICD-10-CM | POA: Diagnosis not present

## 2022-02-13 DIAGNOSIS — M9903 Segmental and somatic dysfunction of lumbar region: Secondary | ICD-10-CM | POA: Diagnosis not present

## 2022-03-10 ENCOUNTER — Other Ambulatory Visit: Payer: Self-pay

## 2022-03-10 ENCOUNTER — Inpatient Hospital Stay: Payer: 59 | Attending: Oncology

## 2022-03-10 DIAGNOSIS — D508 Other iron deficiency anemias: Secondary | ICD-10-CM | POA: Diagnosis not present

## 2022-03-10 DIAGNOSIS — Z9049 Acquired absence of other specified parts of digestive tract: Secondary | ICD-10-CM | POA: Insufficient documentation

## 2022-03-10 DIAGNOSIS — D509 Iron deficiency anemia, unspecified: Secondary | ICD-10-CM

## 2022-03-10 DIAGNOSIS — Z9884 Bariatric surgery status: Secondary | ICD-10-CM | POA: Diagnosis not present

## 2022-03-10 LAB — CBC WITH DIFFERENTIAL/PLATELET
Abs Immature Granulocytes: 0.02 10*3/uL (ref 0.00–0.07)
Basophils Absolute: 0 10*3/uL (ref 0.0–0.1)
Basophils Relative: 1 %
Eosinophils Absolute: 0.2 10*3/uL (ref 0.0–0.5)
Eosinophils Relative: 3 %
HCT: 41 % (ref 36.0–46.0)
Hemoglobin: 13.7 g/dL (ref 12.0–15.0)
Immature Granulocytes: 0 %
Lymphocytes Relative: 28 %
Lymphs Abs: 2.1 10*3/uL (ref 0.7–4.0)
MCH: 29.4 pg (ref 26.0–34.0)
MCHC: 33.4 g/dL (ref 30.0–36.0)
MCV: 88 fL (ref 80.0–100.0)
Monocytes Absolute: 0.6 10*3/uL (ref 0.1–1.0)
Monocytes Relative: 8 %
Neutro Abs: 4.6 10*3/uL (ref 1.7–7.7)
Neutrophils Relative %: 60 %
Platelets: 360 10*3/uL (ref 150–400)
RBC: 4.66 MIL/uL (ref 3.87–5.11)
RDW: 12.1 % (ref 11.5–15.5)
WBC: 7.6 10*3/uL (ref 4.0–10.5)
nRBC: 0 % (ref 0.0–0.2)

## 2022-03-10 LAB — TSH: TSH: 1.469 u[IU]/mL (ref 0.350–4.500)

## 2022-03-10 LAB — FERRITIN: Ferritin: 81 ng/mL (ref 11–307)

## 2022-03-10 LAB — IRON AND TIBC
Iron: 80 ug/dL (ref 28–170)
Saturation Ratios: 24 % (ref 10.4–31.8)
TIBC: 337 ug/dL (ref 250–450)
UIBC: 257 ug/dL

## 2022-03-10 MED FILL — Iron Sucrose Inj 20 MG/ML (Fe Equiv): INTRAVENOUS | Qty: 10 | Status: AC

## 2022-03-12 ENCOUNTER — Inpatient Hospital Stay: Payer: 59

## 2022-03-12 ENCOUNTER — Inpatient Hospital Stay: Payer: 59 | Admitting: Oncology

## 2022-03-13 DIAGNOSIS — M9905 Segmental and somatic dysfunction of pelvic region: Secondary | ICD-10-CM | POA: Diagnosis not present

## 2022-03-13 DIAGNOSIS — M9903 Segmental and somatic dysfunction of lumbar region: Secondary | ICD-10-CM | POA: Diagnosis not present

## 2022-03-13 DIAGNOSIS — M4306 Spondylolysis, lumbar region: Secondary | ICD-10-CM | POA: Diagnosis not present

## 2022-03-13 DIAGNOSIS — M5431 Sciatica, right side: Secondary | ICD-10-CM | POA: Diagnosis not present

## 2022-03-19 DIAGNOSIS — R6889 Other general symptoms and signs: Secondary | ICD-10-CM | POA: Diagnosis not present

## 2022-03-19 MED FILL — Iron Sucrose Inj 20 MG/ML (Fe Equiv): INTRAVENOUS | Qty: 10 | Status: AC

## 2022-03-20 ENCOUNTER — Encounter: Payer: Self-pay | Admitting: Oncology

## 2022-03-20 ENCOUNTER — Inpatient Hospital Stay: Payer: 59

## 2022-03-20 ENCOUNTER — Inpatient Hospital Stay (HOSPITAL_BASED_OUTPATIENT_CLINIC_OR_DEPARTMENT_OTHER): Payer: 59 | Admitting: Oncology

## 2022-03-20 VITALS — BP 117/78 | HR 76 | Temp 98.3°F | Resp 18 | Wt 195.1 lb

## 2022-03-20 DIAGNOSIS — D508 Other iron deficiency anemias: Secondary | ICD-10-CM

## 2022-03-20 DIAGNOSIS — Z9049 Acquired absence of other specified parts of digestive tract: Secondary | ICD-10-CM | POA: Diagnosis not present

## 2022-03-20 DIAGNOSIS — Z9884 Bariatric surgery status: Secondary | ICD-10-CM | POA: Diagnosis not present

## 2022-03-20 DIAGNOSIS — D509 Iron deficiency anemia, unspecified: Secondary | ICD-10-CM

## 2022-03-20 NOTE — Progress Notes (Unsigned)
Pt here for follow up. No new concerns voiced.   

## 2022-03-21 ENCOUNTER — Encounter: Payer: Self-pay | Admitting: Oncology

## 2022-03-21 NOTE — Progress Notes (Signed)
[facility] Cancer Follow Up Note:  Patient Care Team: Leim Fabry, MD as PCP - General (Family Medicine)  REASON FOR VISIT Follow up for treatment of anemia.   HISTORY OF PRESENTING ILLNESS: Elizabeth Good 56 y.o. female with PMH list as below is referred by Dr.Aldridge for evaluation and management of iron deficiency anemia.  She has a history of iron deficiency anemia and history of gastric bypass in 2011. She has been taken oral iron supplementation ferrous sulfate until recently she stopped taking due to GI side effects. She reports feeling fatigued all the time. Her last menstrual period was in April. It was usually having. She has a referral to obtain screening colonoscopy. Labs done on 03/08/2017 at [facility] showed ferritin of 6, transferrin saturation 8   02/07/21 Colonoscopy by Dr. Servando Snare. Previously tolerated IV Feraheme and IV venofer treatments.    INTERVAL HISTORY 56 y.o. female presents for follow up of iron deficiency anemia.  She reports feeling well.  No new complaints.    Review of Systems  Constitutional:  Negative for appetite change, chills and fatigue.  HENT:   Negative for hearing loss.   Eyes:  Negative for eye problems.  Respiratory:  Negative for chest tightness.   Cardiovascular:  Negative for chest pain.  Gastrointestinal:  Negative for abdominal distention.  Endocrine: Negative for hot flashes.  Genitourinary:  Negative for difficulty urinating.   Musculoskeletal:  Negative for arthralgias.  Skin:  Negative for itching.  Neurological:  Negative for dizziness.  Hematological:  Negative for adenopathy.  Psychiatric/Behavioral:  The patient is not nervous/anxious.     MEDICAL HISTORY: Past Medical History:  Diagnosis Date   Allergy    Anemia    Bartholin cyst 1990   Basal cell carcinoma    Curvature of spine    Family history of adverse reaction to anesthesia    Mother - PONV   GERD (gastroesophageal reflux  disease)    High cholesterol    History of bladder infections    History of hiatal hernia    History of kidney infection    History of kidney stones    h/o   Hypertension    Kidney stones    Low serum iron    Meningitis    Age 78   PONV (postoperative nausea and vomiting)    hard to wake up   PVC's (premature ventricular contractions)    with coffee   Renal disorder    Scoliosis    Seizures (HCC)    none since 05/1997.  on medication   Wears contact lenses     SURGICAL HISTORY: Past Surgical History:  Procedure Laterality Date   ANTERIOR AND POSTERIOR VAGINAL REPAIR     [facility]   BIOPSY N/A 08/08/2019   Procedure: UTERINE SEROSA BIOPSIES;  Surgeon: Vena Austria, MD;  Location: [facility];  Service: Gynecology;  Laterality: N/A;   BLADDER SURGERY     BREAST BIOPSY Left 06/29/2019   left breast stereo bx/ x clip/FOCAL ATYPICAL DUCT HYPERPLASIA WITH MICROCALCIFICATIONS.    BREAST BIOPSY Left 07/14/2019   left breast stereo bx/ coil clip/ neg   BREAST BIOPSY Left 08/11/2019   left breast stereo at [facility]/Atypical lobular hyperplasia [facility]), fibroadenomatoid change with sclerosing adenosis, and focal pseudoangiomatous stromal hyperplasia (PASH) with associated microcalcifications.   BREAST EXCISIONAL BIOPSY Left 09/21/2019   Atypical ductal hyperplasia of left breast   CHOLECYSTECTOMY  2011   COLONOSCOPY WITH PROPOFOL N/A 02/07/2021   Procedure:  COLONOSCOPY WITH BIOPSY;  Surgeon: Midge Minium, MD;  Location: [facility];  Service: Endoscopy;  Laterality: N/A;   COMBINED HYSTEROSCOPY DIAGNOSTIC / D&C  12/06/2014   [facility]   ENDOMETRIAL ABLATION  2011   [facility]  Dr. Logan Bores   EXTRACORPOREAL SHOCK WAVE LITHOTRIPSY Right 08/25/2018   Procedure: EXTRACORPOREAL SHOCK WAVE LITHOTRIPSY (ESWL);  Surgeon: Sondra Come, MD;  Location: [facility];  Service: Urology;  Laterality: Right;   FOOT SURGERY Left    GANGLION CYST EXCISION     GASTRIC BYPASS  2011    INCONTINENCE SURGERY     LAPAROSCOPIC BILATERAL SALPINGECTOMY  08/08/2019   Procedure: LAPAROSCOPIC BILATERAL SALPINGECTOMY;  Surgeon: Vena Austria, MD;  Location: [facility];  Service: Gynecology;;   LITHOTRIPSY  2016   OOPHORECTOMY Left 2020   POLYPECTOMY N/A 02/07/2021   Procedure: POLYPECTOMY;  Surgeon: Midge Minium, MD;  Location: [facility];  Service: Endoscopy;  Laterality: N/A;   RECTAL SURGERY     SEPTOPLASTY     TONSILLECTOMY      SOCIAL HISTORY: Social History   Socioeconomic History   Marital status: Divorced    Spouse name: Not on file   Number of children: Not on file   Years of education: Not on file   Highest education level: Not on file  Occupational History   Not on file  Tobacco Use   Smoking status: Never   Smokeless tobacco: Never  Vaping Use   Vaping Use: Never used  Substance and Sexual Activity   Alcohol use: Yes    Alcohol/week: 0.0 standard drinks of alcohol    Comment: social - 1 glass wine/month   Drug use: No   Sexual activity: Yes    Birth control/protection: Pill  Other Topics Concern   Not on file  Social History Narrative   Not on file   Social Determinants of Health   Financial Resource Strain: Not on file  Food Insecurity: Not on file  Transportation Needs: Not on file  Physical Activity: Not on file  Stress: Not on file  Social Connections: Not on file  Intimate Partner Violence: Not on file    FAMILY HISTORY Family History  Problem Relation Age of Onset   Bladder Cancer Mother    Prostate cancer Father    Melanoma Father    Breast cancer Cousin     ALLERGIES:  is allergic to dermabase [albolene], other, latex, penicillins, and sulfa antibiotics.  MEDICATIONS:  Current Outpatient Medications  Medication Sig Dispense Refill   cetirizine (ZYRTEC) 10 MG tablet Take 10 mg by mouth at bedtime.      Cholecalciferol (VITAMIN D3) 5000 units TABS Take 5,000 Units by mouth every 7 (seven) days.     Cobalamin  Combinations (B-12) [phone] MCG SUBL      dexlansoprazole (DEXILANT) 60 MG capsule Take 1 capsule (60 mg total) by mouth once daily as needed (GERD (wean down if possible to avoid long term side effects)) 90 capsule 3   ibuprofen (ADVIL) 600 MG tablet TAKE 1 TABLET (600 MG TOTAL) BY MOUTH EVERY 6 (SIX) HOURS AS NEEDED. 60 tablet 6   LamoTRIgine 200 MG TB24 24 hour tablet One by mouth twice daily. Dr. Betti Cruz brand only. 180 tablet 3   Multiple Vitamin (MULTIVITAMIN) tablet Take 1 tablet by mouth daily.     SEMAGLUTIDE, 1 MG/DOSE, [place] Inject 0.75 mg into the skin once.     triamterene-hydrochlorothiazide (DYAZIDE) 37.5-25 MG capsule Take 1 capsule by mouth every morning  90 capsule 3   dexlansoprazole (DEXILANT) 60 MG capsule Take 60 mg by mouth at bedtime.  (Patient not taking: Reported on 03/20/2022)     Prasterone (INTRAROSA) 6.5 MG INST Place 6.5 mg vaginally at bedtime. (Patient not taking: Reported on 03/20/2022) 28 each 11   Red Yeast Rice 600 MG TABS  (Patient not taking: Reported on 03/20/2022)     No current facility-administered medications for this visit.    PHYSICAL EXAMINATION:  ECOG PERFORMANCE STATUS: 0 - Asymptomatic  Vitals:   03/20/22 1442  BP: 117/78  Pulse: 76  Resp: 18  Temp: 98.3 F (36.8 C)    Filed Weights   03/20/22 1442  Weight: 195 lb 1.6 oz (88.5 kg)     Physical Exam Constitutional:      General: She is not in acute distress.    Appearance: She is not diaphoretic.  HENT:     Head: Normocephalic and atraumatic.     Nose: Nose normal.     Mouth/Throat:     Pharynx: No oropharyngeal exudate.  Eyes:     General: No scleral icterus.       Left eye: No discharge.     Conjunctiva/sclera: Conjunctivae normal.     Pupils: Pupils are equal, round, and reactive to light.  Neck:     Vascular: No JVD.  Cardiovascular:     Rate and Rhythm: Normal rate and regular rhythm.     Heart sounds: Normal heart sounds. No murmur heard. Pulmonary:     Effort:  Pulmonary effort is normal. No respiratory distress.     Breath sounds: Normal breath sounds. No rales.  Chest:     Chest wall: No tenderness.  Abdominal:     General: Bowel sounds are normal. There is no distension.     Palpations: Abdomen is soft. There is no mass.     Tenderness: There is no abdominal tenderness.  Musculoskeletal:        General: Normal range of motion.     Cervical back: Normal range of motion and neck supple.  Lymphadenopathy:     Cervical: No cervical adenopathy.  Skin:    General: Skin is warm and dry.     Findings: No erythema.  Neurological:     Mental Status: She is alert and oriented to person, place, and time.     Cranial Nerves: No cranial nerve deficit.     Motor: No abnormal muscle tone.     Coordination: Coordination normal.     Gait: Gait is intact.  Psychiatric:        Mood and Affect: Affect normal.       LABORATORY DATA: I have personally reviewed the data as listed: CBC    Component Value Date/Time   WBC 7.6 03/10/2022 1530   RBC 4.66 03/10/2022 1530   HGB 13.7 03/10/2022 1530   HGB 11.1 (L) 11/21/2014 1327   HCT 41.0 03/10/2022 1530   HCT 34.9 (L) 11/21/2014 1327   PLT 360 03/10/2022 1530   PLT 275 11/21/2014 1327   MCV 88.0 03/10/2022 1530   MCV 80 11/21/2014 1327   MCH 29.4 03/10/2022 1530   MCHC 33.4 03/10/2022 1530   RDW 12.1 03/10/2022 1530   RDW 14.5 11/21/2014 1327   LYMPHSABS 2.1 03/10/2022 1530   MONOABS 0.6 03/10/2022 1530   EOSABS 0.2 03/10/2022 1530   BASOSABS 0.0 03/10/2022 1530   Iron/TIBC/Ferritin/ %Sat    Component Value Date/Time   IRON 80 03/10/2022 1530  TIBC 337 03/10/2022 1530   FERRITIN 81 03/10/2022 1530   IRONPCTSAT 24 03/10/2022 1530   RADIOGRAPHIC STUDIES: I have personally reviewed the radiological images as listed and agree with the findings in the report Mammogram 03/06/2016 IMPRESSION: 1. 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 2.  No  mammographic evidence of malignancy in the bilateral breasts.   ASSESSMENT/PLAN  1. Other iron deficiency anemia   2. Hx of gastric bypass    #Iron deficiency anemia, Labs are reviewed and discussed with patient. Both hemoglobin and iron level have improved.  No need for additional IV venofer for now.   #Gastric bypass, recommend intermittent IV Venofer maintenance.  Continue B12 supplementation.   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 She declined chemoprevention and genetic testing. Currently she follows up with primary care provider for annual mammogram.   Follow-up in 6 months lab MD +/- Venofer   All questions were answered. The patient knows to call the clinic with any problems, questions or concerns.  Rickard Patience, MD, PhD Hematology Oncology 03/21/2022

## 2022-03-24 DIAGNOSIS — M5431 Sciatica, right side: Secondary | ICD-10-CM | POA: Diagnosis not present

## 2022-03-24 DIAGNOSIS — M4306 Spondylolysis, lumbar region: Secondary | ICD-10-CM | POA: Diagnosis not present

## 2022-03-24 DIAGNOSIS — M9903 Segmental and somatic dysfunction of lumbar region: Secondary | ICD-10-CM | POA: Diagnosis not present

## 2022-03-24 DIAGNOSIS — M9905 Segmental and somatic dysfunction of pelvic region: Secondary | ICD-10-CM | POA: Diagnosis not present

## 2022-04-13 ENCOUNTER — Other Ambulatory Visit: Payer: Self-pay

## 2022-04-13 DIAGNOSIS — R569 Unspecified convulsions: Secondary | ICD-10-CM | POA: Diagnosis not present

## 2022-04-13 DIAGNOSIS — Z1331 Encounter for screening for depression: Secondary | ICD-10-CM | POA: Diagnosis not present

## 2022-04-13 DIAGNOSIS — E7800 Pure hypercholesterolemia, unspecified: Secondary | ICD-10-CM | POA: Diagnosis not present

## 2022-04-13 DIAGNOSIS — R748 Abnormal levels of other serum enzymes: Secondary | ICD-10-CM | POA: Diagnosis not present

## 2022-04-13 DIAGNOSIS — Z9884 Bariatric surgery status: Secondary | ICD-10-CM | POA: Diagnosis not present

## 2022-04-13 DIAGNOSIS — Z0000 Encounter for general adult medical examination without abnormal findings: Secondary | ICD-10-CM | POA: Diagnosis not present

## 2022-04-13 DIAGNOSIS — K912 Postsurgical malabsorption, not elsewhere classified: Secondary | ICD-10-CM | POA: Diagnosis not present

## 2022-04-13 DIAGNOSIS — Z79899 Other long term (current) drug therapy: Secondary | ICD-10-CM | POA: Diagnosis not present

## 2022-04-13 MED ORDER — TRIAMTERENE-HCTZ 37.5-25 MG PO CAPS
1.0000 | ORAL_CAPSULE | Freq: Every morning | ORAL | 3 refills | Status: DC
  Filled 2022-04-13: qty 90, 90d supply, fill #0

## 2022-04-13 MED ORDER — LAMOTRIGINE ER 200 MG PO TB24
ORAL_TABLET | ORAL | 3 refills | Status: DC
  Filled 2022-04-13: qty 180, 90d supply, fill #0
  Filled 2022-07-09: qty 180, 90d supply, fill #1
  Filled 2022-10-12: qty 180, 90d supply, fill #2
  Filled 2023-01-11: qty 180, 90d supply, fill #3

## 2022-04-13 MED ORDER — PANTOPRAZOLE SODIUM 20 MG PO TBEC
DELAYED_RELEASE_TABLET | ORAL | 3 refills | Status: DC
  Filled 2022-04-13: qty 90, 90d supply, fill #0
  Filled 2022-07-09: qty 90, 90d supply, fill #1
  Filled 2022-10-12: qty 90, 90d supply, fill #2
  Filled 2023-01-11: qty 90, 90d supply, fill #3

## 2022-04-14 ENCOUNTER — Other Ambulatory Visit: Payer: Self-pay

## 2022-04-14 DIAGNOSIS — M9903 Segmental and somatic dysfunction of lumbar region: Secondary | ICD-10-CM | POA: Diagnosis not present

## 2022-04-14 DIAGNOSIS — M4306 Spondylolysis, lumbar region: Secondary | ICD-10-CM | POA: Diagnosis not present

## 2022-04-14 DIAGNOSIS — M9905 Segmental and somatic dysfunction of pelvic region: Secondary | ICD-10-CM | POA: Diagnosis not present

## 2022-04-14 DIAGNOSIS — M5431 Sciatica, right side: Secondary | ICD-10-CM | POA: Diagnosis not present

## 2022-05-19 ENCOUNTER — Other Ambulatory Visit: Payer: Self-pay

## 2022-05-19 MED ORDER — NEOMYCIN-POLYMYXIN-DEXAMETH 3.5-10000-0.1 OP SUSP
OPHTHALMIC | 0 refills | Status: DC
  Filled 2022-05-19: qty 5, 30d supply, fill #0

## 2022-06-15 ENCOUNTER — Other Ambulatory Visit: Payer: Self-pay | Admitting: Family Medicine

## 2022-06-15 DIAGNOSIS — R300 Dysuria: Secondary | ICD-10-CM

## 2022-06-15 DIAGNOSIS — N200 Calculus of kidney: Secondary | ICD-10-CM

## 2022-06-15 DIAGNOSIS — Z1231 Encounter for screening mammogram for malignant neoplasm of breast: Secondary | ICD-10-CM

## 2022-06-19 ENCOUNTER — Ambulatory Visit: Admission: RE | Admit: 2022-06-19 | Payer: 59 | Source: Ambulatory Visit

## 2022-06-19 ENCOUNTER — Ambulatory Visit
Admission: RE | Admit: 2022-06-19 | Discharge: 2022-06-19 | Disposition: A | Discharge status: Home / Self Care | Payer: 59 | Source: Ambulatory Visit | Attending: Family Medicine | Admitting: Family Medicine

## 2022-06-19 ENCOUNTER — Ambulatory Visit
Admission: RE | Admit: 2022-06-19 | Discharge: 2022-06-19 | Disposition: A | Discharge status: Home / Self Care | Payer: 59 | Source: Home / Self Care | Attending: Family Medicine | Admitting: Family Medicine

## 2022-06-19 DIAGNOSIS — R300 Dysuria: Secondary | ICD-10-CM | POA: Insufficient documentation

## 2022-06-19 DIAGNOSIS — N200 Calculus of kidney: Secondary | ICD-10-CM | POA: Insufficient documentation

## 2022-06-19 DIAGNOSIS — N2889 Other specified disorders of kidney and ureter: Secondary | ICD-10-CM | POA: Diagnosis not present

## 2022-06-19 DIAGNOSIS — Z87442 Personal history of urinary calculi: Secondary | ICD-10-CM | POA: Diagnosis not present

## 2022-06-23 ENCOUNTER — Ambulatory Visit (INDEPENDENT_AMBULATORY_CARE_PROVIDER_SITE_OTHER): Payer: 59

## 2022-06-23 VITALS — BP 118/78 | HR 79 | Ht 64.0 in | Wt 173.0 lb

## 2022-06-23 DIAGNOSIS — R300 Dysuria: Secondary | ICD-10-CM | POA: Diagnosis not present

## 2022-06-23 LAB — POCT URINALYSIS DIPSTICK OB
Bilirubin, UA: NEGATIVE
Glucose, UA: NEGATIVE
Ketones, UA: NEGATIVE
Nitrite, UA: NEGATIVE
POC,PROTEIN,UA: NEGATIVE
Spec Grav, UA: 1.02 (ref 1.010–1.025)
Urobilinogen, UA: 0.2 E.U./dL
pH, UA: 7 (ref 5.0–8.0)

## 2022-06-23 LAB — POCT URINALYSIS DIPSTICK
Bilirubin, UA: NEGATIVE
Glucose, UA: NEGATIVE
Ketones, UA: NEGATIVE
Nitrite, UA: NEGATIVE
Protein, UA: NEGATIVE
Spec Grav, UA: 1.02 (ref 1.010–1.025)
Urobilinogen, UA: 0.2 E.U./dL
pH, UA: 7 (ref 5.0–8.0)

## 2022-06-23 NOTE — Addendum Note (Signed)
Addended by: Landis Gandy on: 06/23/2022 04:26 PM   Modules accepted: Orders

## 2022-06-23 NOTE — Progress Notes (Signed)
Patient presents today due to UTI symptoms. She states blood in urine, burning with urination, dysuria, urinary frequency. Urine dipstick preformed, showing LEUK and trace and Blood trace  . Per office protocol urine culture sent, Patient states no other concerns at this time. All questions answered.

## 2022-06-23 NOTE — Addendum Note (Signed)
Addended by: Landis Gandy on: 06/23/2022 04:32 PM   Modules accepted: Orders

## 2022-06-25 LAB — URINE CULTURE

## 2022-06-26 ENCOUNTER — Telehealth: Payer: Self-pay

## 2022-06-26 ENCOUNTER — Other Ambulatory Visit: Payer: Self-pay | Admitting: Obstetrics

## 2022-06-26 ENCOUNTER — Other Ambulatory Visit: Payer: Self-pay

## 2022-06-26 MED ORDER — NITROFURANTOIN MONOHYD MACRO 100 MG PO CAPS: 100.0000 mg | ORAL_CAPSULE | Freq: Two times a day (BID) | ORAL | 0 refills | Status: DC

## 2022-06-26 MED ORDER — NITROFURANTOIN MONOHYD MACRO 100 MG PO CAPS
100.0000 mg | ORAL_CAPSULE | Freq: Two times a day (BID) | ORAL | 0 refills | Status: DC
  Filled 2022-06-26: qty 10, 5d supply, fill #0

## 2022-06-26 NOTE — Telephone Encounter (Signed)
Can you please let her know I just sent some Macrobid to her pharmacy?  Thank you!  Missy

## 2022-06-26 NOTE — Telephone Encounter (Signed)
Elizabeth Good received a notification from [facility] that her urine culture is back, nobody has reviewed them and it came back with Escherichia Coli, Can you help me?

## 2022-06-26 NOTE — Telephone Encounter (Signed)
I called her and told her, She no longer uses the [facility]. She's using the [facility] in [place] can you resend for her?

## 2022-07-09 ENCOUNTER — Other Ambulatory Visit: Payer: Self-pay

## 2022-07-10 ENCOUNTER — Other Ambulatory Visit: Payer: Self-pay

## 2022-07-13 ENCOUNTER — Other Ambulatory Visit: Payer: Self-pay

## 2022-07-14 DIAGNOSIS — N390 Urinary tract infection, site not specified: Secondary | ICD-10-CM | POA: Diagnosis not present

## 2022-07-21 ENCOUNTER — Other Ambulatory Visit: Payer: Self-pay

## 2022-07-21 MED ORDER — URELLE 81 MG PO TABS
81.0000 mg | ORAL_TABLET | Freq: Four times a day (QID) | ORAL | 0 refills | Status: DC | PRN
  Filled 2022-07-21: qty 30, 8d supply, fill #0

## 2022-07-22 ENCOUNTER — Other Ambulatory Visit: Payer: Self-pay

## 2022-07-29 ENCOUNTER — Telehealth: Payer: Self-pay

## 2022-07-29 NOTE — Telephone Encounter (Signed)
Made appointment for 08/10/2022

## 2022-07-29 NOTE — Telephone Encounter (Signed)
Okay to overbook, I can see her either in [place] or [place] for hemorrhoid banding  RV

## 2022-07-29 NOTE — Telephone Encounter (Signed)
Patient states she works at [employer] surgery and needs to make a appointment with Dr. Marius Ditch. Informed patient that we do not have a referral and we would need a referral. She states that she talk to Dr. Marius Ditch and she would schedule her a appointment for hemorrhoid banding. Please advise if this is okay to schedule and if so where do you want to see patient at

## 2022-08-06 ENCOUNTER — Other Ambulatory Visit: Payer: Self-pay

## 2022-08-10 ENCOUNTER — Ambulatory Visit: Payer: 59 | Admitting: Gastroenterology

## 2022-08-10 ENCOUNTER — Encounter: Payer: Self-pay | Admitting: Gastroenterology

## 2022-08-10 VITALS — BP 135/87 | HR 77 | Temp 98.7°F | Ht 65.0 in | Wt 169.1 lb

## 2022-08-10 DIAGNOSIS — K641 Second degree hemorrhoids: Secondary | ICD-10-CM

## 2022-08-10 DIAGNOSIS — K5909 Other constipation: Secondary | ICD-10-CM | POA: Diagnosis not present

## 2022-08-10 NOTE — Progress Notes (Unsigned)
Elizabeth Darby, MD [address]  [place]  [place], [place] [address]  Main: [phone]  Fax: [phone]    Gastroenterology Consultation  Referring Provider:     Gayland Curry, MD Primary Care Physician:  Gayland Curry, MD Primary Gastroenterologist:  Dr. Cephas Good Reason for Consultation: Symptomatic hemorrhoids and chronic constipation        HPI:   Elizabeth Good is a 56 y.o. female referred by Dr. Gayland Curry, MD  for consultation & management of symptomatic hemorrhoids and chronic constipation.  Patient has been experiencing hemorrhoidal symptoms including pressure, swelling, discomfort, itching and protrusion of soft tissue during a bowel movement that spontaneously reduces.  She also reports irregular bowel habits associated with straining, incomplete emptying.  She takes a stool softener as needed only.  She has tried over-the-counter hemorrhoidal remedies which did not help much.  She has these hemorrhoidal symptoms ongoing since her pregnancies.  She is interested to undergo outpatient hemorrhoid ligation versus surgical hemorrhoidectomy.  NSAIDs: None  Antiplts/Anticoagulants/Anti thrombotics: None  GI Procedures:  Screening colonoscopy in 2022 - One 5 mm polyp in the transverse colon, removed with a cold snare. Resected and retrieved. - One 4 mm polyp in the sigmoid colon, removed with a cold snare. Resected and retrieved. - Non-bleeding internal hemorrhoids. DIAGNOSIS:  A. COLON POLYP, SIGMOID; COLD SNARE:  - HYPERPLASTIC POLYP.  - NEGATIVE FOR DYSPLASIA AND MALIGNANCY.   B.  COLON POLYP, TRANSVERSE; COLD SNARE:  - POLYPOID FRAGMENT OF EDEMATOUS AND INFLAMED COLONIC MUCOSA, SEE  COMMENT.  - NEGATIVE FOR DYSPLASIA AND MALIGNANCY.    Past Medical History:  Diagnosis Date   Allergy    Anemia    Bartholin cyst 1990   Basal cell carcinoma    Curvature of spine    Family history of adverse reaction to anesthesia    Mother -  PONV   GERD (gastroesophageal reflux disease)    High cholesterol    History of bladder infections    History of hiatal hernia    History of kidney infection    History of kidney stones    h/o   Hypertension    Kidney stones    Low serum iron    Meningitis    Age 50   PONV (postoperative nausea and vomiting)    hard to wake up   PVC's (premature ventricular contractions)    with coffee   Renal disorder    Scoliosis    Seizures ([place])    none since 05/1997.  on medication   Wears contact lenses     Past Surgical History:  Procedure Laterality Date   ANTERIOR AND POSTERIOR VAGINAL REPAIR     [place] N/A 08/08/2019   Procedure: UTERINE SEROSA BIOPSIES;  Surgeon: Malachy Mood, MD;  Location: [facility];  Service: Gynecology;  Laterality: N/A;   BLADDER SURGERY     BREAST BIOPSY Left 06/29/2019   left breast stereo bx/ x clip/FOCAL ATYPICAL DUCT HYPERPLASIA WITH MICROCALCIFICATIONS.    BREAST BIOPSY Left 07/14/2019   left breast stereo bx/ coil clip/ neg   BREAST BIOPSY Left 08/11/2019   left breast stereo at [facility]/Atypical lobular hyperplasia [facility]), fibroadenomatoid change with sclerosing adenosis, and focal pseudoangiomatous stromal hyperplasia (PASH) with associated microcalcifications.   BREAST EXCISIONAL BIOPSY Left 09/21/2019   Atypical ductal hyperplasia of left breast   CHOLECYSTECTOMY  2011   COLONOSCOPY WITH PROPOFOL N/A 02/07/2021   Procedure: COLONOSCOPY WITH BIOPSY;  Surgeon: Allen Norris,  Darren, MD;  Location: [facility];  Service: Endoscopy;  Laterality: N/A;   COMBINED HYSTEROSCOPY DIAGNOSTIC / D&C  12/06/2014   [facility]   ENDOMETRIAL ABLATION  2011   [facility]  Dr. Amalia Hailey   EXTRACORPOREAL SHOCK WAVE LITHOTRIPSY Right 08/25/2018   Procedure: EXTRACORPOREAL SHOCK WAVE LITHOTRIPSY (ESWL);  Surgeon: Billey Co, MD;  Location: [facility];  Service: Urology;  Laterality: Right;   FOOT SURGERY Left    GANGLION CYST EXCISION      GASTRIC BYPASS  2011   INCONTINENCE SURGERY     LAPAROSCOPIC BILATERAL SALPINGECTOMY  08/08/2019   Procedure: LAPAROSCOPIC BILATERAL SALPINGECTOMY;  Surgeon: Malachy Mood, MD;  Location: [facility];  Service: Gynecology;;   LITHOTRIPSY  2016   OOPHORECTOMY Left 2020   POLYPECTOMY N/A 02/07/2021   Procedure: POLYPECTOMY;  Surgeon: Lucilla Lame, MD;  Location: [facility];  Service: Endoscopy;  Laterality: N/A;   RECTAL SURGERY     SEPTOPLASTY     TONSILLECTOMY       Current Outpatient Medications:    cetirizine (ZYRTEC) 10 MG tablet, Take 10 mg by mouth at bedtime. , Disp: , Rfl:    Cholecalciferol (VITAMIN D3) 5000 units TABS, Take 5,000 Units by mouth every 7 (seven) days., Disp: , Rfl:    Cobalamin Combinations (B-12) [phone] MCG SUBL, , Disp: , Rfl:    cyanocobalamin (VITAMIN B12) 1000 MCG tablet, Take 1,000 mcg by mouth daily., Disp: , Rfl:    estradiol (ESTRACE) 0.1 MG/GM vaginal cream, Place 1 g vaginally 2 (two) times a week., Disp: , Rfl:    LamoTRIgine 200 MG TB24 24 hour tablet, One by mouth twice daily. Dr. Reece Levy brand only., Disp: 180 tablet, Rfl: 3   Multiple Vitamin (MULTIVITAMIN) tablet, Take 1 tablet by mouth daily., Disp: , Rfl:    pantoprazole (PROTONIX) 20 MG tablet, Take 1 tablet (20 mg total) by mouth once daily, Disp: 90 tablet, Rfl: 3   Prasterone (INTRAROSA) 6.5 MG INST, Place 6.5 mg vaginally at bedtime., Disp: 28 each, Rfl: 11   Semaglutide-Weight Management 1 MG/0.5ML SOAJ, Inject 1 mg into the skin once a week., Disp: , Rfl:    Urelle (URELLE/URISED) 81 MG TABS tablet, Take 1 tablet (81 mg total) by mouth 4 (four) times daily as needed., Disp: 30 tablet, Rfl: 0   Family History  Problem Relation Age of Onset   Bladder Cancer Mother    Prostate cancer Father    Melanoma Father    Breast cancer Cousin      Social History   Tobacco Use   Smoking status: Never   Smokeless tobacco: Never  Vaping Use   Vaping Use: Never used  Substance Use  Topics   Alcohol use: Yes    Alcohol/week: 0.0 standard drinks of alcohol    Comment: social - 1 glass wine/month   Drug use: No    Allergies as of 08/10/2022 - Review Complete 08/10/2022  Allergen Reaction Noted   Dermabase [albolene]  07/16/2020   Other Dermatitis, Itching, and Other (See Comments) 08/21/2019   Latex Rash 07/19/2015   Penicillins Rash 07/19/2015   Sulfa antibiotics Rash 07/19/2015    Review of Systems:    All systems reviewed and negative except where noted in HPI.   Physical Exam:  BP 135/87 (BP Location: Left Arm, Patient Position: Sitting, Cuff Size: Normal)   Pulse 77   Temp 98.7 F (37.1 C) (Oral)   Ht '5\' 5"'$  (1.651 m)   Wt 169 lb  2 oz (76.7 kg)   LMP 11/29/2017 (Approximate)   BMI 28.14 kg/m  Patient's last menstrual period was 11/29/2017 (approximate).  General:   Alert,  Well-developed, well-nourished, pleasant and cooperative in NAD Head:  Normocephalic and atraumatic. Eyes:  Sclera clear, no icterus.   Conjunctiva pink. Ears:  Normal auditory acuity. Nose:  No deformity, discharge, or lesions. Mouth:  No deformity or lesions,oropharynx pink & moist. Neck:  Supple; no masses or thyromegaly. Lungs:  Respirations even and unlabored.  Clear throughout to auscultation.   No wheezes, crackles, or rhonchi. No acute distress. Heart:  Regular rate and rhythm; no murmurs, clicks, rubs, or gallops. Abdomen:  Normal bowel sounds. Soft, non-tender and non-distended without masses, hepatosplenomegaly or hernias noted.  No guarding or rebound tenderness.   Rectal: Not performed Msk:  Symmetrical without gross deformities. Good, equal movement & strength bilaterally. Pulses:  Normal pulses noted. Extremities:  No clubbing or edema.  No cyanosis. Neurologic:  Alert and oriented x3;  grossly normal neurologically. Skin:  Intact without significant lesions or rashes. No jaundice. Psych:  Alert and cooperative. Normal mood and affect.  Imaging  Studies: Reviewed  Assessment and Plan:   Elveta Rape is a 56 y.o. pleasant Caucasian female with history of hypertension, chronic constipation is seen in consultation for symptomatic hemorrhoids  Chronic idiopathic constipation TSH normal Her diet is devoid of fiber Discussed about high-fiber diet and fiber supplements, information provided Discussed about adequate intake of water Recommend MiraLAX 34 g daily   Grade 2 symptomatic hemorrhoids Discussed about outpatient hemorrhoid ligation, procedure, risks and benefits Consent obtained, patient has latex allergy Perform hemorrhoid ligation today  Follow up in 2 weeks   Elizabeth Darby, MD

## 2022-08-10 NOTE — Patient Instructions (Signed)

## 2022-08-10 NOTE — Progress Notes (Unsigned)
PROCEDURE NOTE: The patient presents with symptomatic grade 2 hemorrhoids, unresponsive to maximal medical therapy, requesting rubber band ligation of his/her hemorrhoidal disease.  All risks, benefits and alternative forms of therapy were described and informed consent was obtained.  In the Left Lateral Decubitus position (if anoscopy is performed) anoscopic examination revealed grade 2 hemorrhoids in the LL position(s).   The decision was made to band the RP internal hemorrhoid, and the [place] was used to perform band ligation without complication.  Digital anorectal examination was then performed to assure proper positioning of the band, and to adjust the banded tissue as required.  The patient was discharged home without pain or other issues.  Dietary and behavioral recommendations were given and (if necessary - prescriptions were given), along with follow-up instructions.  The patient will return 2 weeks for follow-up and possible additional banding as required.  No complications were encountered and the patient tolerated the procedure well.

## 2022-08-11 ENCOUNTER — Encounter: Payer: Self-pay | Admitting: Gastroenterology

## 2022-08-11 DIAGNOSIS — O24419 Gestational diabetes mellitus in pregnancy, unspecified control: Secondary | ICD-10-CM

## 2022-08-11 DIAGNOSIS — Z8661 Personal history of infections of the central nervous system: Secondary | ICD-10-CM | POA: Insufficient documentation

## 2022-08-11 HISTORY — DX: Gestational diabetes mellitus in pregnancy, unspecified control: O24.419

## 2022-08-13 ENCOUNTER — Encounter: Payer: Self-pay | Admitting: Oncology

## 2022-08-15 ENCOUNTER — Encounter: Payer: Self-pay | Admitting: Oncology

## 2022-08-20 ENCOUNTER — Ambulatory Visit
Admission: RE | Admit: 2022-08-20 | Discharge: 2022-08-20 | Disposition: A | Discharge status: Home / Self Care | Payer: 59 | Source: Ambulatory Visit | Attending: Family Medicine | Admitting: Family Medicine

## 2022-08-20 DIAGNOSIS — Z1231 Encounter for screening mammogram for malignant neoplasm of breast: Secondary | ICD-10-CM | POA: Insufficient documentation

## 2022-09-02 ENCOUNTER — Ambulatory Visit: Payer: Self-pay | Admitting: Gastroenterology

## 2022-09-07 ENCOUNTER — Encounter: Payer: Self-pay | Admitting: Oncology

## 2022-09-07 ENCOUNTER — Other Ambulatory Visit: Payer: Self-pay

## 2022-09-07 MED ORDER — WEGOVY 1 MG/0.5ML ~~LOC~~ SOAJ
1.0000 mg | SUBCUTANEOUS | 1 refills | Status: DC
  Filled 2022-09-07 – 2022-09-17 (×2): qty 2, 28d supply, fill #0
  Filled 2022-10-12: qty 2, 28d supply, fill #1

## 2022-09-08 ENCOUNTER — Other Ambulatory Visit: Payer: Self-pay

## 2022-09-10 ENCOUNTER — Encounter: Payer: Self-pay | Admitting: Gastroenterology

## 2022-09-10 ENCOUNTER — Ambulatory Visit: Payer: Commercial Managed Care - PPO | Admitting: Gastroenterology

## 2022-09-10 VITALS — BP 125/80 | HR 80 | Temp 97.9°F | Ht 65.0 in | Wt 165.5 lb

## 2022-09-10 DIAGNOSIS — K641 Second degree hemorrhoids: Secondary | ICD-10-CM | POA: Diagnosis not present

## 2022-09-10 NOTE — Progress Notes (Addendum)

## 2022-09-17 ENCOUNTER — Other Ambulatory Visit: Payer: Self-pay

## 2022-09-18 ENCOUNTER — Other Ambulatory Visit: Payer: Self-pay

## 2022-09-18 ENCOUNTER — Other Ambulatory Visit: Payer: 59

## 2022-09-18 ENCOUNTER — Encounter: Payer: Self-pay | Admitting: Oncology

## 2022-09-22 ENCOUNTER — Ambulatory Visit: Payer: 59

## 2022-09-22 ENCOUNTER — Ambulatory Visit: Payer: 59 | Admitting: Oncology

## 2022-09-23 DIAGNOSIS — Z124 Encounter for screening for malignant neoplasm of cervix: Secondary | ICD-10-CM | POA: Diagnosis not present

## 2022-09-23 DIAGNOSIS — Z01419 Encounter for gynecological examination (general) (routine) without abnormal findings: Secondary | ICD-10-CM | POA: Diagnosis not present

## 2022-09-23 DIAGNOSIS — Z1239 Encounter for other screening for malignant neoplasm of breast: Secondary | ICD-10-CM | POA: Diagnosis not present

## 2022-09-24 ENCOUNTER — Ambulatory Visit: Payer: Commercial Managed Care - PPO | Admitting: Gastroenterology

## 2022-09-24 ENCOUNTER — Encounter: Payer: Self-pay | Admitting: Gastroenterology

## 2022-09-24 VITALS — BP 147/89 | HR 80 | Temp 97.8°F | Ht 65.0 in | Wt 166.0 lb

## 2022-09-24 DIAGNOSIS — K641 Second degree hemorrhoids: Secondary | ICD-10-CM | POA: Diagnosis not present

## 2022-09-24 NOTE — Progress Notes (Signed)
PROCEDURE NOTE: The patient presents with symptomatic grade 2 hemorrhoids, unresponsive to maximal medical therapy, requesting rubber band ligation of his/her hemorrhoidal disease.  All risks, benefits and alternative forms of therapy were described and informed consent was obtained.  Patient continues to notice improvement in her hemorrhoidal symptoms  Anoscopy was performed which revealed retained band on the right anterior hemorrhoid that was placed on 09/10/2022.  Digital rectal exam was performed to remove the band, but I was only able to loosen the band, not able to completely remove the band.  There was evidence of post banding ulcer and some amount of bleeding during an attempted to remove the band  After the anoscopy, the decision was made to band the LL internal hemorrhoid, and the [place] was used to perform band ligation without complication.  Digital anorectal examination was then performed to assure proper positioning of the band, and to adjust the banded tissue as required.  The patient was discharged home without pain or other issues.  Dietary and behavioral recommendations were given and (if necessary - prescriptions were given), along with follow-up instructions.  The patient will return as needed for follow-up and possible additional banding as required.  No complications were encountered and the patient tolerated the procedure well.

## 2022-09-28 ENCOUNTER — Emergency Department
Admission: EM | Admit: 2022-09-28 | Discharge: 2022-09-28 | Disposition: A | Discharge status: Home / Self Care | Payer: Commercial Managed Care - PPO | Attending: Emergency Medicine | Admitting: Emergency Medicine

## 2022-09-28 ENCOUNTER — Emergency Department: Payer: Commercial Managed Care - PPO

## 2022-09-28 DIAGNOSIS — S199XXA Unspecified injury of neck, initial encounter: Secondary | ICD-10-CM | POA: Diagnosis not present

## 2022-09-28 DIAGNOSIS — M542 Cervicalgia: Secondary | ICD-10-CM | POA: Diagnosis present

## 2022-09-28 DIAGNOSIS — Y92410 Unspecified street and highway as the place of occurrence of the external cause: Secondary | ICD-10-CM | POA: Insufficient documentation

## 2022-09-28 DIAGNOSIS — S060X0A Concussion without loss of consciousness, initial encounter: Secondary | ICD-10-CM | POA: Diagnosis not present

## 2022-09-28 DIAGNOSIS — S161XXA Strain of muscle, fascia and tendon at neck level, initial encounter: Secondary | ICD-10-CM | POA: Diagnosis not present

## 2022-09-28 DIAGNOSIS — R519 Headache, unspecified: Secondary | ICD-10-CM | POA: Insufficient documentation

## 2022-09-28 DIAGNOSIS — S0990XA Unspecified injury of head, initial encounter: Secondary | ICD-10-CM | POA: Diagnosis not present

## 2022-09-28 MED ORDER — IBUPROFEN 600 MG PO TABS
600.0000 mg | ORAL_TABLET | Freq: Once | ORAL | Status: AC
  Administered 2022-09-28: 600 mg via ORAL
  Filled 2022-09-28: qty 1

## 2022-09-28 MED ORDER — OXYCODONE-ACETAMINOPHEN 5-325 MG PO TABS
1.0000 | ORAL_TABLET | Freq: Once | ORAL | Status: AC
  Administered 2022-09-28: 1 via ORAL
  Filled 2022-09-28: qty 1

## 2022-09-28 MED ORDER — CYCLOBENZAPRINE HCL 5 MG PO TABS: 5.0000 mg | ORAL_TABLET | Freq: Three times a day (TID) | ORAL | 0 refills | Status: DC | PRN
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
## 2022-09-28 NOTE — ED Notes (Signed)
Back from CT
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
## 2022-10-02 ENCOUNTER — Other Ambulatory Visit: Payer: Self-pay

## 2022-10-13 ENCOUNTER — Other Ambulatory Visit: Payer: Self-pay

## 2022-10-15 ENCOUNTER — Encounter: Payer: Self-pay | Admitting: Oncology

## 2022-10-15 ENCOUNTER — Other Ambulatory Visit (HOSPITAL_COMMUNITY): Payer: Self-pay

## 2022-10-19 ENCOUNTER — Inpatient Hospital Stay: Payer: Commercial Managed Care - PPO | Attending: Oncology

## 2022-10-19 DIAGNOSIS — R748 Abnormal levels of other serum enzymes: Secondary | ICD-10-CM | POA: Diagnosis not present

## 2022-10-19 DIAGNOSIS — D508 Other iron deficiency anemias: Secondary | ICD-10-CM | POA: Insufficient documentation

## 2022-10-19 DIAGNOSIS — N6099 Unspecified benign mammary dysplasia of unspecified breast: Secondary | ICD-10-CM | POA: Diagnosis not present

## 2022-10-19 DIAGNOSIS — Z9884 Bariatric surgery status: Secondary | ICD-10-CM | POA: Insufficient documentation

## 2022-10-19 DIAGNOSIS — Z79899 Other long term (current) drug therapy: Secondary | ICD-10-CM | POA: Diagnosis not present

## 2022-10-19 LAB — CBC WITH DIFFERENTIAL/PLATELET
Abs Immature Granulocytes: 0.01 10*3/uL (ref 0.00–0.07)
Basophils Absolute: 0 10*3/uL (ref 0.0–0.1)
Basophils Relative: 1 %
Eosinophils Absolute: 0.2 10*3/uL (ref 0.0–0.5)
Eosinophils Relative: 3 %
HCT: 42.5 % (ref 36.0–46.0)
Hemoglobin: 14 g/dL (ref 12.0–15.0)
Immature Granulocytes: 0 %
Lymphocytes Relative: 30 %
Lymphs Abs: 2 10*3/uL (ref 0.7–4.0)
MCH: 28.9 pg (ref 26.0–34.0)
MCHC: 32.9 g/dL (ref 30.0–36.0)
MCV: 87.6 fL (ref 80.0–100.0)
Monocytes Absolute: 0.6 10*3/uL (ref 0.1–1.0)
Monocytes Relative: 9 %
Neutro Abs: 3.8 10*3/uL (ref 1.7–7.7)
Neutrophils Relative %: 57 %
Platelets: 349 10*3/uL (ref 150–400)
RBC: 4.85 MIL/uL (ref 3.87–5.11)
RDW: 12.5 % (ref 11.5–15.5)
WBC: 6.6 10*3/uL (ref 4.0–10.5)
nRBC: 0 % (ref 0.0–0.2)

## 2022-10-19 LAB — VITAMIN B12: Vitamin B-12: 3512 pg/mL — ABNORMAL HIGH (ref 180–914)

## 2022-10-19 LAB — IRON AND TIBC
Iron: 78 ug/dL (ref 28–170)
Saturation Ratios: 24 % (ref 10.4–31.8)
TIBC: 325 ug/dL (ref 250–450)
UIBC: 247 ug/dL

## 2022-10-19 LAB — FERRITIN: Ferritin: 80 ng/mL (ref 11–307)

## 2022-10-20 ENCOUNTER — Telehealth: Payer: Commercial Managed Care - PPO | Admitting: Family Medicine

## 2022-10-20 DIAGNOSIS — J069 Acute upper respiratory infection, unspecified: Secondary | ICD-10-CM | POA: Diagnosis not present

## 2022-10-20 MED ORDER — IPRATROPIUM BROMIDE 0.03 % NA SOLN: 2.0000 | Freq: Two times a day (BID) | NASAL | 0 refills | Status: DC

## 2022-10-20 NOTE — Progress Notes (Signed)
E-Visit for Sinus Problems  We are sorry that you are not feeling well.  Here is how we plan to help!  Based on what you have shared with me it looks like you have sinusitis.  Sinusitis is inflammation and infection in the sinus cavities of the head.  Based on your presentation I believe you most likely have Acute Viral Sinusitis.This is an infection most likely caused by a virus. There is not specific treatment for viral sinusitis other than to help you with the symptoms until the infection runs its course.  You may use an oral decongestant such as Mucinex D or if you have glaucoma or high blood pressure use plain Mucinex. Saline nasal spray help and can safely be used as often as needed for congestion, I have prescribed: Ipratropium Bromide nasal spray 0.03% 2 sprays in eah nostril 2-3 times a day  Add with the Flonase/ steroid nasal spray you are using.  Some authorities believe that zinc sprays or the use of Echinacea may shorten the course of your symptoms.  Sinus infections are not as easily transmitted as other respiratory infection, however we still recommend that you avoid close contact with loved ones, especially the very young and elderly.  Remember to wash your hands thoroughly throughout the day as this is the number one way to prevent the spread of infection!  Home Care: Only take medications as instructed by your medical team. Do not take these medications with alcohol. A steam or ultrasonic humidifier can help congestion.  You can place a towel over your head and breathe in the steam from hot water coming from a faucet. Avoid close contacts especially the very young and the elderly. Cover your mouth when you cough or sneeze. Always remember to wash your hands.  Get Help Right Away If: You develop worsening fever or sinus pain. You develop a severe head ache or visual changes. Your symptoms persist after you have completed your treatment plan.  Make sure you Understand these  instructions. Will watch your condition. Will get help right away if you are not doing well or get worse.   Thank you for choosing an e-visit.  Your e-visit answers were reviewed by a board certified advanced clinical practitioner to complete your personal care plan. Depending upon the condition, your plan could have included both over the counter or prescription medications.  Please review your pharmacy choice. Make sure the pharmacy is open so you can pick up prescription now. If there is a problem, you may contact your provider through [employer] and have the prescription routed to another pharmacy.  Your safety is important to [place]. If you have drug allergies check your prescription carefully.   For the next 24 hours you can use MyChart to ask questions about today's visit, request a non-urgent call back, or ask for a work or school excuse. You will get an email in the next two days asking about your experience. I hope that your e-visit has been valuable and will speed your recovery.  I provided 5 minutes of non face-to-face time during this encounter for chart review, medication and order placement, as well as and documentation.

## 2022-10-21 ENCOUNTER — Other Ambulatory Visit: Payer: Self-pay

## 2022-10-21 ENCOUNTER — Inpatient Hospital Stay: Payer: Commercial Managed Care - PPO

## 2022-10-21 ENCOUNTER — Inpatient Hospital Stay (HOSPITAL_BASED_OUTPATIENT_CLINIC_OR_DEPARTMENT_OTHER): Payer: Commercial Managed Care - PPO | Admitting: Oncology

## 2022-10-21 ENCOUNTER — Encounter: Payer: Self-pay | Admitting: Oncology

## 2022-10-21 VITALS — BP 125/74 | HR 85 | Resp 16

## 2022-10-21 VITALS — BP 134/88 | HR 80 | Temp 97.9°F | Resp 18 | Wt 160.5 lb

## 2022-10-21 DIAGNOSIS — D508 Other iron deficiency anemias: Secondary | ICD-10-CM | POA: Diagnosis not present

## 2022-10-21 DIAGNOSIS — R748 Abnormal levels of other serum enzymes: Secondary | ICD-10-CM

## 2022-10-21 DIAGNOSIS — Z9884 Bariatric surgery status: Secondary | ICD-10-CM | POA: Diagnosis not present

## 2022-10-21 DIAGNOSIS — N6099 Unspecified benign mammary dysplasia of unspecified breast: Secondary | ICD-10-CM | POA: Diagnosis not present

## 2022-10-21 DIAGNOSIS — Z79899 Other long term (current) drug therapy: Secondary | ICD-10-CM | POA: Diagnosis not present

## 2022-10-21 DIAGNOSIS — D509 Iron deficiency anemia, unspecified: Secondary | ICD-10-CM

## 2022-10-21 LAB — ALKALINE PHOSPHATASE: Alkaline Phosphatase: 117 U/L (ref 38–126)

## 2022-10-21 LAB — GAMMA GT: GGT: 16 U/L (ref 7–50)

## 2022-10-21 MED ORDER — SODIUM CHLORIDE 0.9 % IV SOLN
200.0000 mg | Freq: Once | INTRAVENOUS | Status: AC
  Administered 2022-10-21: 200 mg via INTRAVENOUS
  Filled 2022-10-21: qty 200

## 2022-10-21 MED ORDER — SODIUM CHLORIDE 0.9 % IV SOLN
INTRAVENOUS | Status: DC
  Filled 2022-10-21 (×2): qty 250

## 2022-10-21 NOTE — Progress Notes (Signed)
Pt here for follow up. Pt reports feeling tired and feet and hands get cold a lot.

## 2022-10-21 NOTE — Assessment & Plan Note (Signed)
She prefers to have work up done for elevated ALK.  Check GGT, alk bone specific I encourage her to further discuss with pcp regarding work up results.

## 2022-10-21 NOTE — Assessment & Plan Note (Signed)
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 She declined chemoprevention and genetic testing. Continue follows up with primary care provider for annual mammogram.

## 2022-10-21 NOTE — Patient Instructions (Signed)
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
## 2022-10-21 NOTE — Assessment & Plan Note (Addendum)
Labs are reviewed and discussed with patient. Both hemoglobin and iron level are stable.  Patient feels fatigued and previously fatigue improved after IV Venofer.  She will proceed with one dose of Venfoer 235m today as maintenance.   B12 level is elevated. I recommend patient to decrease B12 supplementation to twice weekly.

## 2022-10-27 ENCOUNTER — Ambulatory Visit: Payer: Commercial Managed Care - PPO

## 2022-10-29 ENCOUNTER — Encounter: Payer: Self-pay | Admitting: Oncology

## 2022-11-05 ENCOUNTER — Other Ambulatory Visit: Payer: Self-pay

## 2022-11-05 MED ORDER — SEMAGLUTIDE-WEIGHT MANAGEMENT 1 MG/0.5ML ~~LOC~~ SOAJ
1.0000 mg | SUBCUTANEOUS | 1 refills | Status: DC
  Filled 2022-11-05: qty 2, 28d supply, fill #0
  Filled 2022-11-30: qty 2, 28d supply, fill #1

## 2022-11-06 ENCOUNTER — Other Ambulatory Visit: Payer: Self-pay

## 2022-11-06 MED ORDER — WEGOVY 1 MG/0.5ML ~~LOC~~ SOAJ
1.0000 mg | SUBCUTANEOUS | 0 refills | Status: AC
  Filled 2022-11-06 – 2023-01-12 (×3): qty 2, 28d supply, fill #0

## 2022-12-29 ENCOUNTER — Other Ambulatory Visit: Payer: Self-pay

## 2023-01-11 ENCOUNTER — Other Ambulatory Visit: Payer: Self-pay

## 2023-01-11 DIAGNOSIS — S161XXA Strain of muscle, fascia and tendon at neck level, initial encounter: Secondary | ICD-10-CM | POA: Diagnosis not present

## 2023-01-11 DIAGNOSIS — M546 Pain in thoracic spine: Secondary | ICD-10-CM | POA: Diagnosis not present

## 2023-01-11 DIAGNOSIS — S134XXA Sprain of ligaments of cervical spine, initial encounter: Secondary | ICD-10-CM | POA: Diagnosis not present

## 2023-01-11 MED ORDER — MELOXICAM 15 MG PO TABS
15.0000 mg | ORAL_TABLET | Freq: Every day | ORAL | 1 refills | Status: DC | PRN
  Filled 2023-01-11: qty 30, 30d supply, fill #0
  Filled 2023-02-10: qty 30, 30d supply, fill #1

## 2023-01-11 MED ORDER — TIZANIDINE HCL 4 MG PO TABS
4.0000 mg | ORAL_TABLET | Freq: Every evening | ORAL | 1 refills | Status: DC | PRN
  Filled 2023-01-11: qty 30, 30d supply, fill #0
  Filled 2023-02-10: qty 30, 30d supply, fill #1

## 2023-01-12 ENCOUNTER — Other Ambulatory Visit: Payer: Self-pay | Admitting: Orthopedic Surgery

## 2023-01-12 ENCOUNTER — Other Ambulatory Visit: Payer: Self-pay

## 2023-01-12 DIAGNOSIS — M546 Pain in thoracic spine: Secondary | ICD-10-CM

## 2023-01-13 ENCOUNTER — Other Ambulatory Visit: Payer: Self-pay

## 2023-01-19 ENCOUNTER — Ambulatory Visit
Admission: RE | Admit: 2023-01-19 | Discharge: 2023-01-19 | Disposition: A | Discharge status: Home / Self Care | Payer: Commercial Managed Care - PPO | Source: Ambulatory Visit | Attending: Orthopedic Surgery | Admitting: Orthopedic Surgery

## 2023-01-19 DIAGNOSIS — M4802 Spinal stenosis, cervical region: Secondary | ICD-10-CM | POA: Diagnosis not present

## 2023-01-19 DIAGNOSIS — M546 Pain in thoracic spine: Secondary | ICD-10-CM | POA: Diagnosis not present

## 2023-02-26 ENCOUNTER — Encounter: Payer: Self-pay | Admitting: Oncology

## 2023-03-02 ENCOUNTER — Encounter: Payer: Self-pay | Admitting: Oncology

## 2023-03-11 ENCOUNTER — Ambulatory Visit: Payer: Commercial Managed Care - PPO | Admitting: Physical Therapy

## 2023-03-11 NOTE — Therapy (Deleted)
OUTPATIENT PHYSICAL THERAPY NECK EVALUATION   Patient Name: Elizabeth Good MRN: [id number] DOB:[birth year], 57 y.o., female Today's Date: 03/11/2023    Past Medical History:  Diagnosis Date   Allergy    Anemia    Bartholin cyst 1990   Basal cell carcinoma    Curvature of spine    Family history of adverse reaction to anesthesia    Mother - PONV   GERD (gastroesophageal reflux disease)    Gestational diabetes 08/11/2022   High cholesterol    History of bladder infections    History of hiatal hernia    History of kidney infection    History of kidney stones    h/o   Hypertension    Kidney stones    Low serum iron    Meningitis    Age 85   PONV (postoperative nausea and vomiting)    hard to wake up   PVC's (premature ventricular contractions)    with coffee   Renal disorder    Scoliosis    Seizures (HCC)    none since 05/1997.  on medication   Wears contact lenses    Past Surgical History:  Procedure Laterality Date   ANTERIOR AND POSTERIOR VAGINAL REPAIR     [facility]   BIOPSY N/A 08/08/2019   Procedure: UTERINE SEROSA BIOPSIES;  Surgeon: Vena Austria, MD;  Location: [facility];  Service: Gynecology;  Laterality: N/A;   BLADDER SURGERY     BREAST BIOPSY Left 06/29/2019   left breast stereo bx/ x clip/FOCAL ATYPICAL DUCT HYPERPLASIA WITH MICROCALCIFICATIONS.    BREAST BIOPSY Left 07/14/2019   left breast stereo bx/ coil clip/ neg   BREAST BIOPSY Left 08/11/2019   left breast stereo at [facility]/Atypical lobular hyperplasia [facility]), fibroadenomatoid change with sclerosing adenosis, and focal pseudoangiomatous stromal hyperplasia (PASH) with associated microcalcifications.   BREAST EXCISIONAL BIOPSY Left 09/21/2019   Atypical ductal hyperplasia of left breast   CHOLECYSTECTOMY  2011   COLONOSCOPY WITH PROPOFOL N/A 02/07/2021   Procedure: COLONOSCOPY WITH BIOPSY;  Surgeon: Midge Minium, MD;  Location: [facility];  Service: Endoscopy;  Laterality:  N/A;   COMBINED HYSTEROSCOPY DIAGNOSTIC / D&C  12/06/2014   [facility]   ENDOMETRIAL ABLATION  2011   [facility]  Dr. Logan Bores   EXTRACORPOREAL SHOCK WAVE LITHOTRIPSY Right 08/25/2018   Procedure: EXTRACORPOREAL SHOCK WAVE LITHOTRIPSY (ESWL);  Surgeon: Sondra Come, MD;  Location: [facility];  Service: Urology;  Laterality: Right;   FOOT SURGERY Left    GANGLION CYST EXCISION     GASTRIC BYPASS  2011   INCONTINENCE SURGERY     LAPAROSCOPIC BILATERAL SALPINGECTOMY  08/08/2019   Procedure: LAPAROSCOPIC BILATERAL SALPINGECTOMY;  Surgeon: Vena Austria, MD;  Location: [facility];  Service: Gynecology;;   LITHOTRIPSY  2016   OOPHORECTOMY Left 2020   POLYPECTOMY N/A 02/07/2021   Procedure: POLYPECTOMY;  Surgeon: Midge Minium, MD;  Location: [facility];  Service: Endoscopy;  Laterality: N/A;   RECTAL SURGERY     SEPTOPLASTY     TONSILLECTOMY     Patient Active Problem List   Diagnosis Date Noted   Elevated alkaline phosphatase level 10/21/2022   Atypical ductal hyperplasia of breast 10/21/2022   H/O meningitis 08/11/2022   Special screening for malignant neoplasms, colon    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   GERD (gastroesophageal reflux disease) 09/28/2018   Seizures (HCC) 09/28/2018   Personal history of kidney stones 06/23/2018   Microhematuria 06/23/2018   Dysuria 06/23/2018   Calcium,  urinary 06/16/2018   Varicose veins of both lower extremities with pain 02/11/2018   Chronic venous insufficiency 02/11/2018   Iron deficiency anemia 03/25/2017   H/O gastric bypass 03/25/2017   Uterus, adenomyosis 03/08/2017   Hamartoma (HCC) 03/11/2016   High cholesterol 02/25/2016   Chronic cystitis 08/14/2013   Kidney stone 08/14/2013   Renal colic 08/14/2013    PCP: Leim Fabry, MD  REFERRING PROVIDER: Leim Fabry, MD  REFERRING DIAGNOSIS:  S16.1XXA (ICD-10-CM) - Cervical strain  S13.4XXA (ICD-10-CM) - Sprain of ligaments of  cervical spine, initial encounter    THERAPY DIAG: Cervicalgia  Pain in thoracic spine  RATIONALE FOR EVALUATION AND TREATMENT: Rehabilitation  ONSET DATE: ***  FOLLOW UP APPT WITH PROVIDER: {yes/no:20286}    SUBJECTIVE:                                                                                                                                                                                         Chief Complaint: ***  Pertinent History ***  Pain:  Pain Intensity: Present: /10, Best: /10, Worst: /10 Pain location: *** Pain Quality: {PAIN DESCRIPTION:21022940}  Radiating: {yes/no:20286}  Numbness/Tingling: {yes/no:20286} Focal Weakness: {yes/no:20286} Aggravating factors: *** Relieving factors: *** 24-hour pain behavior: *** History of prior neck injury, pain, surgery, or therapy: {yes/no:20286} Falls: Has patient fallen in last 6 months? {yes/no:20286}, Number of falls: *** Follow-up appointment with MD: {yes/no:20286} Dominant hand: {RIGHT/LEFT:20294} Imaging: {yes/no:20286}  Prior level of function: {PLOF:24004} Occupational demands: Hobbies: Red flags (personal history of cancer, h/o spinal tumors, history of compression fracture, chills/fever, night sweats, nausea, vomiting, unrelenting pain): Negative  Precautions: {Therapy precautions:24002}  Weight Bearing Restrictions: {Yes ***/No:24003}  Living Environment Lives with: {[facility] lives with:25569::"lives with their family"} Lives in: {Lives in:25570}   Patient Goals: ***   OBJECTIVE:   Patient Surveys  {rehab surveys:24030}  Cognition Patient is oriented to person, place, and time.  Recent memory is intact.  Remote memory is intact.  Attention span and concentration are intact.  Expressive speech is intact.  Patient's fund of knowledge is within normal limits for educational level.    Gross Musculoskeletal Assessment Tremor: None Bulk: Normal Tone:  Normal   Gait   Posture   AROM AROM (Normal range in degrees) AROM 03/11/2023  Cervical  Flexion (50)   Extension (80)   Right lateral flexion (45)   Left lateral flexion (45)   Right rotation (85)   Left rotation (85)   (* = pain; Blank rows = not tested)  PROM PROM (Normal range in degrees) PROM 03/11/2023  Cervical  Flexion (50)   Extension (80)   Right lateral flexion (45)  Left lateral flexion (45)   Right rotation (85)   Left rotation (85)   (* = pain; Blank rows = not tested)  MMT MMT (out of 5) Right 03/11/2023 Left 03/11/2023  Cervical (isometric)  Flexion WNL  Extension WNL  Lateral Flexion WNL WNL  Rotation WNL WNL      Shoulder   Flexion    Extension    Abduction    Internal rotation    Horizontal abduction    Horizontal adduction    Lower Trapezius    Rhomboids        Elbow  Flexion    Extension    Pronation    Supination        Wrist  Flexion    Extension    Radial deviation    Ulnar deviation        MCP  Flexion    Extension    Abduction    Adduction    (* = pain; Blank rows = not tested)  Sensation Grossly intact to light touch bilateral UE as determined by testing dermatomes C2-T2. Proprioception and hot/cold testing deferred on this date.  Reflexes R/L Elbow: 2+/2+  Brachioradialis: 2+/2+  Tricep: 2+/2+  Palpation Location LEFT  RIGHT           Suboccipitals    Cervical paraspinals    Upper Trapezius    Levator Scapulae    Rhomboid Major/Minor    (Blank rows = not tested) Graded on 0-4 scale (0 = no pain, 1 = pain, 2 = pain with wincing/grimacing/flinching, 3 = pain with withdrawal, 4 = unwilling to allow palpation), (Blank rows = not tested)  Repeated Movements No centralization or peripheralization of symptoms with repeated cervical protraction and retraction.   Passive Accessory Intervertebral Motion Pt denies reproduction of back pain with CPA L1-L5 and UPA bilaterally L1-L5. Generally, hypomobile  throughout   SPECIAL TESTS Spurlings A (ipsilateral lateral flexion/axial compression): R: {NEGATIVE/POSITIVE RUE:45409} L: {NEGATIVE/POSITIVE WJX:91478} Spurlings B (ipsilateral lateral flexion/contralateral rotation/axial compression): R: {NEGATIVE/POSITIVE GNF:62130} L: {NEGATIVE/POSITIVE QMV:78469} Distraction Test: {NEGATIVE/POSITIVE GEX:52841}  Hoffman Sign (cervical cord compression): R: {NEGATIVE/POSITIVE LKG:40102} L: {NEGATIVE/POSITIVE VOZ:36644} ULTT Median: R: {NEGATIVE/POSITIVE IHK:74259} L: {NEGATIVE/POSITIVE DGL:87564} ULTT Ulnar: R: {NEGATIVE/POSITIVE PPI:95188} L: {NEGATIVE/POSITIVE CZY:60630} ULTT Radial: R: {NEGATIVE/POSITIVE ZSW:10932} L: {NEGATIVE/POSITIVE TFT:73220}  Clinical Prediction Rules  Diagnostic Cervical Radiculopathy ULTTa: Any one of the following: A) symptom reproduction; B) side-to-side difference >10 degrees in elbow extension; or C) with regard to involved/painful side: ipsilateral neck lateral flexion decreases symptoms and/or contralateral neck lateral flexion increases symptoms.  ROM: involved-side cervical rotation range of motion less than 60 degrees Distraction test: symptom reduction.  Spurling's A: symptom reproduction.   Number of Positive Criteria  Sensitivity  Specificity  Pos LR  Neg LR   Two  0.39 0.56 0.88 1.09   Three  0.39  0.94  6.1 0.65   Four  0.24 0.99  30.3 0.77   Pos LR = positive likelihood ratio. Neg LR = negative likelihood ratio.    Interventional  Cervical Manipulation 1. Symptom duration of less than 38 days 2. Positive expectation that manipulation will help 3. Side-to-side difference in cervical rotation ROM of 10 or greater 4. Pain with posteroanterior spring testing of the middle cervical spine If at least 3 attributes were present (+LR, 13.5), the probability of experiencing a successful outcome is 90%. IMPLICATIONS: The findi  Cervical Traction patient reported periperalization with lower cervical spine  (C4-7) mobility testing,  positive shoulder abduction test,  age >  55,  positive upper limb tension test A,  positive neck distraction test  Number of Positive Criteria  Sensitivity  Specificity  Pos LR  Neg LR  Probability of Success w/ traction + exercise  One 0.07 0.97 1.15 0.21 47.6%  Two  0.3 0.97 1.44 0.40 53.2%  Three 0.63 0.87 4.81 0.42 79.2%  Four  0.3 1.0 23.1 0.71 94.8%  Pos LR = positive likelihood ratio. Neg LR = negative likelihood ratio.  Having at least 3 out of 5 predictors appears to be the optimal threshold for choosing the cervical traction as an intervention.    TODAY'S TREATMENT  ***   PATIENT EDUCATION:  Education details: Plan of care Person educated: Patient Education method: Explanation Education comprehension: verbalized understanding   HOME EXERCISE PROGRAM: ***   ASSESSMENT:  CLINICAL IMPRESSION: Patient is a *** y.o. *** who was seen today for physical therapy evaluation and treatment for neck pain. Objective impairments include {opptimpairments:25111}. These impairments are limiting patient from {activity limitations:25113}. Personal factors including {Personal factors:25162} are also affecting patient's functional outcome. Patient will benefit from skilled PT to address above impairments and improve overall function.  REHAB POTENTIAL: {rehabpotential:25112}  CLINICAL DECISION MAKING: {clinical decision making:25114}  EVALUATION COMPLEXITY: {Evaluation complexity:25115}   GOALS: Goals reviewed with patient? Yes  SHORT TERM GOALS: Target date: {follow up:25551}  Pt will be independent with HEP to improve strength and decrease neck pain to improve pain-free function at home and work. Baseline: *** Goal status: INITIAL   LONG TERM GOALS: Target date: {follow up:25551}  Pt will increase FOTO to at least *** to demonstrate significant improvement in function at home and work related to neck pain  Baseline:  Goal status: INITIAL  2.   Pt will decrease worst neck pain by at least 2 points on the NPRS in order to demonstrate clinically significant reduction in neck pain. Baseline: *** Goal status: INITIAL  3.  Pt will decrease NDI score by at least 19% in order demonstrate clinically significant reduction in neck pain/disability.       Baseline: *** Goal status: INITIAL  4.  *** Baseline: *** Goal status: INITIAL   PLAN: PT FREQUENCY: 2x/week  PT DURATION: 8 weeks  PLANNED INTERVENTIONS: Therapeutic exercises, Therapeutic activity, Neuromuscular re-education, Balance training, Gait training, Patient/Family education, Joint manipulation, Joint mobilization, Vestibular training, Canalith repositioning, Dry Needling, Electrical stimulation, Spinal manipulation, Spinal mobilization, Cryotherapy, Moist heat, Taping, Traction, Ultrasound, Ionotophoresis 4mg /ml Dexamethasone, and Manual therapy  PLAN FOR NEXT SESSION: ***   Gertie Exon 03/11/2023, 9:06 AM

## 2023-03-15 ENCOUNTER — Ambulatory Visit: Payer: Commercial Managed Care - PPO | Attending: Orthopedic Surgery | Admitting: Physical Therapy

## 2023-03-15 DIAGNOSIS — M542 Cervicalgia: Secondary | ICD-10-CM

## 2023-03-15 DIAGNOSIS — M546 Pain in thoracic spine: Secondary | ICD-10-CM | POA: Diagnosis not present
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
## 2023-03-17 ENCOUNTER — Encounter: Payer: Self-pay | Admitting: Physical Therapy

## 2023-03-18 ENCOUNTER — Ambulatory Visit: Payer: Commercial Managed Care - PPO | Admitting: Podiatry

## 2023-03-18 ENCOUNTER — Encounter: Payer: Self-pay | Admitting: Podiatry

## 2023-03-18 DIAGNOSIS — L603 Nail dystrophy: Secondary | ICD-10-CM | POA: Diagnosis not present

## 2023-03-18 NOTE — Progress Notes (Signed)
Subjective:  Patient ID: Elizabeth Good, female    DOB: 1966/01/19,  MRN: [id number] HPI Chief Complaint  Patient presents with   Nail Problem    Hallux bilateral - toenails thick and discolored x years, been on lamisil twice with no improvement   New Patient (Initial Visit)    57 y.o. female presents with the above complaint.   ROS: Denies fever chills nausea vomit muscle aches pains calf pain back pain chest pain shortness of breath.  Past Medical History:  Diagnosis Date   Allergy    Anemia    Bartholin cyst 1990   Basal cell carcinoma    Curvature of spine    Family history of adverse reaction to anesthesia    Mother - PONV   GERD (gastroesophageal reflux disease)    Gestational diabetes 08/11/2022   High cholesterol    History of bladder infections    History of hiatal hernia    History of kidney infection    History of kidney stones    h/o   Hypertension    Kidney stones    Low serum iron    Meningitis    Age 42   PONV (postoperative nausea and vomiting)    hard to wake up   PVC's (premature ventricular contractions)    with coffee   Renal disorder    Scoliosis    Seizures (HCC)    none since 05/1997.  on medication   Wears contact lenses    Past Surgical History:  Procedure Laterality Date   ANTERIOR AND POSTERIOR VAGINAL REPAIR     [facility]   BIOPSY N/A 08/08/2019   Procedure: UTERINE SEROSA BIOPSIES;  Surgeon: Vena Austria, MD;  Location: [facility];  Service: Gynecology;  Laterality: N/A;   BLADDER SURGERY     BREAST BIOPSY Left 06/29/2019   left breast stereo bx/ x clip/FOCAL ATYPICAL DUCT HYPERPLASIA WITH MICROCALCIFICATIONS.    BREAST BIOPSY Left 07/14/2019   left breast stereo bx/ coil clip/ neg   BREAST BIOPSY Left 08/11/2019   left breast stereo at [facility]/Atypical lobular hyperplasia [facility]), fibroadenomatoid change with sclerosing adenosis, and focal pseudoangiomatous stromal hyperplasia (PASH) with associated microcalcifications.    BREAST EXCISIONAL BIOPSY Left 09/21/2019   Atypical ductal hyperplasia of left breast   CHOLECYSTECTOMY  2011   COLONOSCOPY WITH PROPOFOL N/A 02/07/2021   Procedure: COLONOSCOPY WITH BIOPSY;  Surgeon: Midge Minium, MD;  Location: [facility];  Service: Endoscopy;  Laterality: N/A;   COMBINED HYSTEROSCOPY DIAGNOSTIC / D&C  12/06/2014   [facility]   ENDOMETRIAL ABLATION  2011   [facility]  Dr. Logan Bores   EXTRACORPOREAL SHOCK WAVE LITHOTRIPSY Right 08/25/2018   Procedure: EXTRACORPOREAL SHOCK WAVE LITHOTRIPSY (ESWL);  Surgeon: Sondra Come, MD;  Location: [facility];  Service: Urology;  Laterality: Right;   FOOT SURGERY Left    GANGLION CYST EXCISION     GASTRIC BYPASS  2011   INCONTINENCE SURGERY     LAPAROSCOPIC BILATERAL SALPINGECTOMY  08/08/2019   Procedure: LAPAROSCOPIC BILATERAL SALPINGECTOMY;  Surgeon: Vena Austria, MD;  Location: [facility];  Service: Gynecology;;   LITHOTRIPSY  2016   OOPHORECTOMY Left 2020   POLYPECTOMY N/A 02/07/2021   Procedure: POLYPECTOMY;  Surgeon: Midge Minium, MD;  Location: [facility];  Service: Endoscopy;  Laterality: N/A;   RECTAL SURGERY     SEPTOPLASTY     TONSILLECTOMY      Current Outpatient Medications:    cetirizine (ZYRTEC) 10 MG tablet, Take 10 mg by mouth  at bedtime. , Disp: , Rfl:    Cholecalciferol (VITAMIN D3) 5000 units TABS, Take 5,000 Units by mouth every 7 (seven) days., Disp: , Rfl:    Cobalamin Combinations (B-12) [phone] MCG SUBL, , Disp: , Rfl:    cyanocobalamin (VITAMIN B12) 1000 MCG tablet, Take 1,000 mcg by mouth daily., Disp: , Rfl:    LamoTRIgine 200 MG TB24 24 hour tablet, One by mouth twice daily. Dr. Betti Cruz brand only., Disp: 180 tablet, Rfl: 3   meloxicam (MOBIC) 15 MG tablet, Take 1 tablet (15 mg total) by mouth daily as needed., Disp: 30 tablet, Rfl: 1   Multiple Vitamin (MULTIVITAMIN) tablet, Take 1 tablet by mouth daily., Disp: , Rfl:    pantoprazole (PROTONIX) 20 MG tablet, Take 1 tablet (20  mg total) by mouth once daily, Disp: 90 tablet, Rfl: 3   Semaglutide-Weight Management (WEGOVY) 1 MG/0.5ML SOAJ, Inject 1 mg into the skin once a week., Disp: 6 mL, Rfl: 0   tiZANidine (ZANAFLEX) 4 MG tablet, Take 1 tablet (4 mg total) by mouth at bedtime as needed., Disp: 30 tablet, Rfl: 1  Allergies  Allergen Reactions   Dermabase [Albolene]    Other Dermatitis, Itching and Other (See Comments)    DERMABOND; BLISTERING   Latex Rash    Dental appliances, balloons, gloves, condoms   Penicillins Rash    Did it involve swelling of the face/tongue/throat, SOB, or low BP? No Did it involve sudden or severe rash/hives, skin peeling, or any reaction on the inside of your mouth or nose? No Did you need to seek medical attention at a hospital or doctor's office? No When did it last happen?       If all above answers are "NO", may proceed with cephalosporin use.    Sulfa Antibiotics Rash   Review of Systems Objective:  There were no vitals filed for this visit.  General: Well developed, nourished, in no acute distress, alert and oriented x3   Dermatological: Skin is warm, dry and supple bilateral. Nails x 10 are well maintained; though she does have thick dystrophic possibly mycotic nails hallux bilateral and second digit of the left foot.  Remaining integument appears unremarkable at this time. There are no open sores, no preulcerative lesions, no rash or signs of infection present.  Vascular: Dorsalis Pedis artery and Posterior Tibial artery pedal pulses are 2/4 bilateral with immedate capillary fill time. Pedal hair growth present. No varicosities and no lower extremity edema present bilateral.   Neruologic: Grossly intact via light touch bilateral. Vibratory intact via tuning fork bilateral. Protective threshold with Semmes Wienstein monofilament intact to all pedal sites bilateral. Patellar and Achilles deep tendon reflexes 2+ bilateral. No Babinski or clonus noted bilateral.    Musculoskeletal: No gross boney pedal deformities bilateral. No pain, crepitus, or limitation noted with foot and ankle range of motion bilateral. Muscular strength 5/5 in all groups tested bilateral.  Gait: Unassisted, Nonantalgic.    Radiographs:  None taken  Assessment & Plan:   Assessment: Nail dystrophy cannot rule out onychomycosis.  Plan: Samples of skin and nail were taken today for pathologic evaluation we will follow-up with her in 1 month for results.  She states that she did respond to oral therapy and it was improving but returned once the therapy was completed.     Almer Bushey T. [place], [place]

## 2023-03-23 ENCOUNTER — Ambulatory Visit: Payer: Commercial Managed Care - PPO | Admitting: Physical Therapy

## 2023-03-23 DIAGNOSIS — M546 Pain in thoracic spine: Secondary | ICD-10-CM | POA: Diagnosis not present

## 2023-03-23 DIAGNOSIS — M542 Cervicalgia: Secondary | ICD-10-CM | POA: Diagnosis not present
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
## 2023-03-25 ENCOUNTER — Encounter: Payer: Self-pay | Admitting: Physical Therapy

## 2023-03-25 ENCOUNTER — Ambulatory Visit: Payer: Commercial Managed Care - PPO | Admitting: Physical Therapy

## 2023-03-25 DIAGNOSIS — M546 Pain in thoracic spine: Secondary | ICD-10-CM

## 2023-03-25 DIAGNOSIS — M542 Cervicalgia: Secondary | ICD-10-CM | POA: Diagnosis not present
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
## 2023-03-26 ENCOUNTER — Other Ambulatory Visit (INDEPENDENT_AMBULATORY_CARE_PROVIDER_SITE_OTHER): Payer: Self-pay | Admitting: Nurse Practitioner

## 2023-03-26 DIAGNOSIS — I83819 Varicose veins of unspecified lower extremities with pain: Secondary | ICD-10-CM

## 2023-03-29 ENCOUNTER — Encounter (INDEPENDENT_AMBULATORY_CARE_PROVIDER_SITE_OTHER): Payer: Self-pay | Admitting: Vascular Surgery

## 2023-03-29 ENCOUNTER — Ambulatory Visit (INDEPENDENT_AMBULATORY_CARE_PROVIDER_SITE_OTHER): Payer: Commercial Managed Care - PPO | Admitting: Vascular Surgery

## 2023-03-29 ENCOUNTER — Ambulatory Visit (INDEPENDENT_AMBULATORY_CARE_PROVIDER_SITE_OTHER): Payer: Commercial Managed Care - PPO

## 2023-03-29 VITALS — BP 133/88 | HR 80 | Resp 16 | Wt 159.0 lb

## 2023-03-29 DIAGNOSIS — K219 Gastro-esophageal reflux disease without esophagitis: Secondary | ICD-10-CM

## 2023-03-29 DIAGNOSIS — I872 Venous insufficiency (chronic) (peripheral): Secondary | ICD-10-CM

## 2023-03-29 DIAGNOSIS — I83819 Varicose veins of unspecified lower extremities with pain: Secondary | ICD-10-CM | POA: Diagnosis not present

## 2023-03-29 DIAGNOSIS — I83813 Varicose veins of bilateral lower extremities with pain: Secondary | ICD-10-CM

## 2023-03-29 DIAGNOSIS — E7800 Pure hypercholesterolemia, unspecified: Secondary | ICD-10-CM | POA: Diagnosis not present

## 2023-03-29 NOTE — Progress Notes (Signed)
MRN : [id number]  Elizabeth Good is a 57 y.o. (July 08, 1966) female who presents with chief complaint of varicose veins hurt.  History of Present Illness:   The patient returns for followup evaluation of her varicose veins. The patient continues to have pain in the lower extremities with dependency. The pain is lessened with elevation. Graduated compression stockings, Class I (20-30 mmHg), have been worn but the stockings do not eliminate the leg pain. Over-the-counter analgesics do not improve the symptoms. The degree of discomfort continues to interfere with daily activities. The patient notes the pain in the legs is causing problems with daily exercise, at the workplace and even with household activities and maintenance such as standing in the kitchen preparing meals and doing dishes.   Venous ultrasound shows normal deep venous system, no evidence of acute or chronic DVT.  Superficial reflux is not present in the great saphenous vein however extensive reflux is noted in the branch varicosities  No outpatient medications have been marked as taking for the 03/29/23 encounter (Appointment) with Gilda Crease, Latina Craver, MD.    Past Medical History:  Diagnosis Date   Allergy    Anemia    Bartholin cyst 1990   Basal cell carcinoma    Curvature of spine    Family history of adverse reaction to anesthesia    Mother - PONV   GERD (gastroesophageal reflux disease)    Gestational diabetes 08/11/2022   High cholesterol    History of bladder infections    History of hiatal hernia    History of kidney infection    History of kidney stones    h/o   Hypertension    Kidney stones    Low serum iron    Meningitis    Age 29   PONV (postoperative nausea and vomiting)    hard to wake up   PVC's (premature ventricular contractions)    with coffee   Renal disorder    Scoliosis    Seizures (HCC)    none since 05/1997.  on medication   Wears contact lenses     Past Surgical  History:  Procedure Laterality Date   ANTERIOR AND POSTERIOR VAGINAL REPAIR     [facility]   BIOPSY N/A 08/08/2019   Procedure: UTERINE SEROSA BIOPSIES;  Surgeon: Vena Austria, MD;  Location: [facility];  Service: Gynecology;  Laterality: N/A;   BLADDER SURGERY     BREAST BIOPSY Left 06/29/2019   left breast stereo bx/ x clip/FOCAL ATYPICAL DUCT HYPERPLASIA WITH MICROCALCIFICATIONS.    BREAST BIOPSY Left 07/14/2019   left breast stereo bx/ coil clip/ neg   BREAST BIOPSY Left 08/11/2019   left breast stereo at [facility]/Atypical lobular hyperplasia [facility] fibroadenomatoid change with sclerosing adenosis, and focal pseudoangiomatous stromal hyperplasia (PASH) with associated microcalcifications.   BREAST EXCISIONAL BIOPSY Left 09/21/2019   Atypical ductal hyperplasia of left breast   CHOLECYSTECTOMY  2011   COLONOSCOPY WITH PROPOFOL N/A 02/07/2021   Procedure: COLONOSCOPY WITH BIOPSY;  Surgeon: Midge Minium, MD;  Location: [facility];  Service: Endoscopy;  Laterality: N/A;   COMBINED HYSTEROSCOPY DIAGNOSTIC / D&C  12/06/2014   [facility]   ENDOMETRIAL ABLATION  2011   [facility]  Dr. Logan Bores   EXTRACORPOREAL SHOCK WAVE LITHOTRIPSY Right 08/25/2018   Procedure: EXTRACORPOREAL SHOCK WAVE LITHOTRIPSY (ESWL);  Surgeon: Sondra Come, MD;  Location: [facility];  Service: Urology;  Laterality: Right;   FOOT  SURGERY Left    GANGLION CYST EXCISION     GASTRIC BYPASS  2011   INCONTINENCE SURGERY     LAPAROSCOPIC BILATERAL SALPINGECTOMY  08/08/2019   Procedure: LAPAROSCOPIC BILATERAL SALPINGECTOMY;  Surgeon: Vena Austria, MD;  Location: [facility];  Service: Gynecology;;   LITHOTRIPSY  2016   OOPHORECTOMY Left 2020   POLYPECTOMY N/A 02/07/2021   Procedure: POLYPECTOMY;  Surgeon: Midge Minium, MD;  Location: [facility];  Service: Endoscopy;  Laterality: N/A;   RECTAL SURGERY     SEPTOPLASTY     TONSILLECTOMY      Social History Social History   Tobacco Use    Smoking status: Never   Smokeless tobacco: Never  Vaping Use   Vaping status: Never Used  Substance Use Topics   Alcohol use: Yes    Alcohol/week: 0.0 standard drinks of alcohol    Comment: social - 1 glass wine/month   Drug use: No    Family History Family History  Problem Relation Age of Onset   Bladder Cancer Mother    Prostate cancer Father    Melanoma Father    Breast cancer Cousin     Allergies  Allergen Reactions   Dermabase [Albolene]    Other Dermatitis, Itching and Other (See Comments)    DERMABOND; BLISTERING   Latex Rash    Dental appliances, balloons, gloves, condoms   Penicillins Rash    Did it involve swelling of the face/tongue/throat, SOB, or low BP? No Did it involve sudden or severe rash/hives, skin peeling, or any reaction on the inside of your mouth or nose? No Did you need to seek medical attention at a hospital or doctor's office? No When did it last happen?       If all above answers are "NO", may proceed with cephalosporin use.    Sulfa Antibiotics Rash     REVIEW OF SYSTEMS (Negative unless checked)  Constitutional: [] Weight loss  [] Fever  [] Chills Cardiac: [] Chest pain   [] Chest pressure   [] Palpitations   [] Shortness of breath when laying flat   [] Shortness of breath with exertion. Vascular:  [] Pain in legs with walking   [x] Pain in legs with standing  [] History of DVT   [] Phlebitis   [] Swelling in legs   [x] Varicose veins   [] Non-healing ulcers Pulmonary:   [] Uses home oxygen   [] Productive cough   [] Hemoptysis   [] Wheeze  [] COPD   [] Asthma Neurologic:  [] Dizziness   [] Seizures   [] History of stroke   [] History of TIA  [] Aphasia   [] Vissual changes   [] Weakness or numbness in arm   [] Weakness or numbness in leg Musculoskeletal:   [] Joint swelling   [] Joint pain   [] Low back pain Hematologic:  [] Easy bruising  [] Easy bleeding   [] Hypercoagulable state   [] Anemic Gastrointestinal:  [] Diarrhea   [] Vomiting  [x] Gastroesophageal reflux/heartburn    [] Difficulty swallowing. Genitourinary:  [] Chronic kidney disease   [] Difficult urination  [] Frequent urination   [] Blood in urine Skin:  [] Rashes   [] Ulcers  Psychological:  [] History of anxiety   []  History of major depression.  Physical Examination  There were no vitals filed for this visit. There is no height or weight on file to calculate BMI. Gen: WD/WN, NAD Head: [place]/AT, No temporalis wasting.  Ear/Nose/Throat: Hearing grossly intact, nares w/o erythema or drainage, pinna without lesions Eyes: PER, EOMI, sclera nonicteric.  Neck: Supple, no gross masses.  No JVD.  Pulmonary:  Good air movement, no audible wheezing, no use of accessory  muscles.  Cardiac: RRR, precordium not hyperdynamic. Vascular:  Large varicosities present, greater than 10 mm bilaterally.  Veins are tender to palpation  Mild venous stasis changes to the legs bilaterally.  Trace soft pitting edema CEAP C3sEpAsPr Vessel Right Left  Radial Palpable Palpable  Gastrointestinal: soft, non-distended. No guarding/no peritoneal signs.  Musculoskeletal: M/S 5/5 throughout.  No deformity.  Neurologic: CN 2-12 intact. Pain and light touch intact in extremities.  Symmetrical.  Speech is fluent. Motor exam as listed above. Psychiatric: Judgment intact, Mood & affect appropriate for pt's clinical situation. Dermatologic: Venous rashes no ulcers noted.  No changes consistent with cellulitis. Lymph : No lichenification or skin changes of chronic lymphedema.  CBC Lab Results  Component Value Date   WBC 6.6 10/19/2022   HGB 14.0 10/19/2022   HCT 42.5 10/19/2022   MCV 87.6 10/19/2022   PLT 349 10/19/2022    BMET    Component Value Date/Time   NA 140 04/29/2017 0833   NA 137 11/21/2014 1327   K 3.8 08/04/2019 0934   K 3.3 (L) 11/21/2014 1327   CL 105 04/29/2017 0833   CL 103 11/21/2014 1327   CO2 28 04/29/2017 0833   CO2 28 11/21/2014 1327   GLUCOSE 85 04/29/2017 0833   GLUCOSE 89 11/21/2014 1327   BUN 9  04/29/2017 0833   BUN 10 11/21/2014 1327   CREATININE 0.74 04/29/2017 0833   CREATININE 0.71 11/21/2014 1327   CALCIUM 8.9 04/29/2017 0833   CALCIUM 8.5 (L) 11/21/2014 1327   GFRNONAA >60 04/29/2017 0833   GFRNONAA >60 11/21/2014 1327   GFRAA >60 04/29/2017 0833   GFRAA >60 11/21/2014 1327   CrCl cannot be calculated (Patient's most recent lab result is older than the maximum 21 days allowed.).  COAG No results found for: "INR", "PROTIME"  Radiology No results found.   Assessment/Plan 1. Varicose veins of both lower extremities with pain Recommend:  The patient has numerous large varicosities that are tender to palpation and in the spite of conservative therapy she is still having persistent symptoms of pain and swelling that are having a negative impact on daily life and daily activities.  Patient should undergo injection sclerotherapy to treat the numerous large varicosities.  The risks, benefits and alternative therapies were reviewed in detail with the patient.  All questions were answered.  The patient agrees to proceed with sclerotherapy at their convenience.  The patient will continue wearing the graduated compression stockings and using the over-the-counter pain medications to treat her symptoms.      2. Chronic venous insufficiency No surgery or intervention at this point in time.   The patient is CEAP C4sEpAsPr   I have discussed with the patient venous insufficiency and why it  causes symptoms. I have discussed with the patient the chronic skin changes that accompany venous insufficiency and the long term sequela such as infection and ulceration.  Patient will begin wearing graduated compression stockings or compression wraps on a daily basis.  The patient will put the compression on first thing in the morning and removing them in the evening. The patient is instructed specifically not to sleep in the compression.    In addition, behavioral modification including  several periods of elevation of the lower extremities during the day will be continued. I have demonstrated that proper elevation is a position with the ankles at heart level.  The patient is instructed to begin routine exercise, especially walking on a daily basis  The patient will be  assessed for a Lymph Pump depending on the effectiveness of conservative therapy and the control of the associated lymphedema.  3. Gastroesophageal reflux disease, unspecified whether esophagitis present Continue PPI as already ordered, this medication has been reviewed and there are no changes at this time.  Avoidence of caffeine and alcohol  Moderate elevation of the head of the bed   4. High cholesterol Continue statin as ordered and reviewed, no changes at this time    Levora Dredge, MD  03/29/2023 1:17 PM

## 2023-03-30 ENCOUNTER — Ambulatory Visit: Payer: Commercial Managed Care - PPO | Admitting: Physical Therapy

## 2023-03-30 DIAGNOSIS — M546 Pain in thoracic spine: Secondary | ICD-10-CM

## 2023-03-30 DIAGNOSIS — M542 Cervicalgia: Secondary | ICD-10-CM | POA: Diagnosis not present
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
## 2023-03-31 ENCOUNTER — Encounter: Payer: Self-pay | Admitting: Physical Therapy

## 2023-04-02 ENCOUNTER — Encounter (INDEPENDENT_AMBULATORY_CARE_PROVIDER_SITE_OTHER): Payer: Self-pay | Admitting: Vascular Surgery

## 2023-04-06 ENCOUNTER — Ambulatory Visit: Payer: Commercial Managed Care - PPO | Attending: Orthopedic Surgery | Admitting: Physical Therapy

## 2023-04-06 DIAGNOSIS — M542 Cervicalgia: Secondary | ICD-10-CM

## 2023-04-06 DIAGNOSIS — M546 Pain in thoracic spine: Secondary | ICD-10-CM | POA: Diagnosis not present
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
## 2023-04-08 ENCOUNTER — Encounter: Payer: Self-pay | Admitting: Physical Therapy

## 2023-04-08 ENCOUNTER — Ambulatory Visit: Payer: Commercial Managed Care - PPO | Admitting: Physical Therapy

## 2023-04-08 DIAGNOSIS — M546 Pain in thoracic spine: Secondary | ICD-10-CM

## 2023-04-08 DIAGNOSIS — M542 Cervicalgia: Secondary | ICD-10-CM | POA: Diagnosis not present
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
## 2023-04-13 ENCOUNTER — Encounter: Payer: Self-pay | Admitting: Physical Therapy

## 2023-04-13 ENCOUNTER — Ambulatory Visit: Payer: Commercial Managed Care - PPO | Admitting: Physical Therapy

## 2023-04-13 DIAGNOSIS — M542 Cervicalgia: Secondary | ICD-10-CM

## 2023-04-13 DIAGNOSIS — M546 Pain in thoracic spine: Secondary | ICD-10-CM | POA: Diagnosis not present
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
## 2023-04-15 ENCOUNTER — Ambulatory Visit: Payer: Commercial Managed Care - PPO | Admitting: Physical Therapy

## 2023-04-19 ENCOUNTER — Other Ambulatory Visit: Payer: Self-pay

## 2023-04-19 DIAGNOSIS — Z79899 Other long term (current) drug therapy: Secondary | ICD-10-CM | POA: Diagnosis not present

## 2023-04-19 DIAGNOSIS — R399 Unspecified symptoms and signs involving the genitourinary system: Secondary | ICD-10-CM | POA: Diagnosis not present

## 2023-04-19 DIAGNOSIS — Z0000 Encounter for general adult medical examination without abnormal findings: Secondary | ICD-10-CM | POA: Diagnosis not present

## 2023-04-19 DIAGNOSIS — Z9884 Bariatric surgery status: Secondary | ICD-10-CM | POA: Diagnosis not present

## 2023-04-19 DIAGNOSIS — Z13220 Encounter for screening for lipoid disorders: Secondary | ICD-10-CM | POA: Diagnosis not present

## 2023-04-19 DIAGNOSIS — Z1331 Encounter for screening for depression: Secondary | ICD-10-CM | POA: Diagnosis not present

## 2023-04-19 DIAGNOSIS — R569 Unspecified convulsions: Secondary | ICD-10-CM | POA: Diagnosis not present

## 2023-04-19 MED ORDER — CEPHALEXIN 500 MG PO CAPS
500.0000 mg | ORAL_CAPSULE | Freq: Three times a day (TID) | ORAL | 0 refills | Status: DC
  Filled 2023-04-19: qty 15, 5d supply, fill #0

## 2023-04-19 MED ORDER — FLUCONAZOLE 150 MG PO TABS
150.0000 mg | ORAL_TABLET | Freq: Once | ORAL | 0 refills | Status: AC
  Filled 2023-04-19: qty 1, 1d supply, fill #0

## 2023-04-19 MED ORDER — PANTOPRAZOLE SODIUM 40 MG PO TBEC
40.0000 mg | DELAYED_RELEASE_TABLET | Freq: Every day | ORAL | 3 refills | Status: DC
  Filled 2023-04-19: qty 90, 90d supply, fill #0
  Filled 2023-08-30: qty 90, 90d supply, fill #1
  Filled 2023-11-29: qty 90, 90d supply, fill #2
  Filled 2024-02-28: qty 90, 90d supply, fill #3

## 2023-04-19 MED ORDER — LAMOTRIGINE ER 200 MG PO TB24
200.0000 mg | ORAL_TABLET | Freq: Two times a day (BID) | ORAL | 3 refills | Status: DC
  Filled 2023-04-19: qty 180, 90d supply, fill #0
  Filled 2023-08-30: qty 180, 90d supply, fill #1
  Filled 2023-11-29: qty 180, 90d supply, fill #2
  Filled 2024-02-28: qty 180, 90d supply, fill #3

## 2023-04-20 ENCOUNTER — Ambulatory Visit: Payer: Commercial Managed Care - PPO | Admitting: Physical Therapy

## 2023-04-20 ENCOUNTER — Other Ambulatory Visit: Payer: Self-pay

## 2023-04-20 DIAGNOSIS — M542 Cervicalgia: Secondary | ICD-10-CM | POA: Diagnosis not present

## 2023-04-20 DIAGNOSIS — M546 Pain in thoracic spine: Secondary | ICD-10-CM
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
## 2023-04-22 ENCOUNTER — Ambulatory Visit: Payer: Commercial Managed Care - PPO | Admitting: Physical Therapy

## 2023-04-22 ENCOUNTER — Encounter: Payer: Self-pay | Admitting: Physical Therapy

## 2023-04-22 ENCOUNTER — Ambulatory Visit: Payer: Commercial Managed Care - PPO | Admitting: Podiatry

## 2023-04-23 ENCOUNTER — Other Ambulatory Visit: Payer: Self-pay

## 2023-04-23 MED ORDER — TRAMADOL HCL 50 MG PO TABS
50.0000 mg | ORAL_TABLET | Freq: Four times a day (QID) | ORAL | 0 refills | Status: DC | PRN
  Filled 2023-04-23: qty 30, 8d supply, fill #0

## 2023-04-23 MED ORDER — CELECOXIB 200 MG PO CAPS
200.0000 mg | ORAL_CAPSULE | Freq: Every day | ORAL | 1 refills | Status: DC
  Filled 2023-04-23: qty 30, 30d supply, fill #0
  Filled 2023-05-28: qty 30, 30d supply, fill #1

## 2023-04-27 ENCOUNTER — Encounter: Payer: Commercial Managed Care - PPO | Admitting: Physical Therapy

## 2023-04-27 ENCOUNTER — Other Ambulatory Visit: Payer: Self-pay

## 2023-04-27 MED ORDER — NITROFURANTOIN MONOHYD MACRO 100 MG PO CAPS
100.0000 mg | ORAL_CAPSULE | Freq: Two times a day (BID) | ORAL | 0 refills | Status: DC
  Filled 2023-04-27: qty 10, 5d supply, fill #0

## 2023-04-29 ENCOUNTER — Encounter: Payer: Commercial Managed Care - PPO | Admitting: Physical Therapy

## 2023-05-04 ENCOUNTER — Ambulatory Visit: Payer: Commercial Managed Care - PPO | Attending: Orthopedic Surgery | Admitting: Physical Therapy

## 2023-05-04 ENCOUNTER — Encounter: Payer: Self-pay | Admitting: Physical Therapy

## 2023-05-04 ENCOUNTER — Encounter: Payer: Self-pay | Admitting: Podiatry

## 2023-05-04 ENCOUNTER — Ambulatory Visit: Payer: Commercial Managed Care - PPO | Admitting: Podiatry

## 2023-05-04 DIAGNOSIS — M542 Cervicalgia: Secondary | ICD-10-CM | POA: Insufficient documentation

## 2023-05-04 DIAGNOSIS — L603 Nail dystrophy: Secondary | ICD-10-CM

## 2023-05-04 DIAGNOSIS — M546 Pain in thoracic spine: Secondary | ICD-10-CM | POA: Diagnosis not present

## 2023-05-04 MED ORDER — TERBINAFINE HCL 250 MG PO TABS: 250.0000 mg | ORAL_TABLET | Freq: Every day | ORAL | 0 refills | Status: DC
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
## 2023-05-04 NOTE — Progress Notes (Signed)
She presents today for follow-up of her nail pathology.  She denies any changes in her nail plates.  Objective: No change from previous evaluation.  Pathology result does demonstrate saprophytic fungus present.  Assessment onychomycosis.  Plan: Start her on long-term therapy with Lamisil blood work was recently performed at [facility] demonstrating comprehensive metabolic panel normal AST ALT and BUN/creatinine ratio.  We will start Lamisil 250 mg tablets.  She will take 1 tablet at night for the next 30 days I will follow-up with her at that time for blood work.  She would also like to start laser therapy for her toenails we will schedule that for her.

## 2023-05-06 ENCOUNTER — Ambulatory Visit: Payer: Commercial Managed Care - PPO | Admitting: Physical Therapy

## 2023-05-06 DIAGNOSIS — M546 Pain in thoracic spine: Secondary | ICD-10-CM | POA: Diagnosis not present

## 2023-05-06 DIAGNOSIS — M542 Cervicalgia: Secondary | ICD-10-CM
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
## 2023-05-10 ENCOUNTER — Other Ambulatory Visit: Payer: Self-pay

## 2023-05-10 ENCOUNTER — Encounter: Payer: Self-pay | Admitting: Physical Therapy

## 2023-05-11 ENCOUNTER — Ambulatory Visit: Payer: Commercial Managed Care - PPO | Admitting: Physical Therapy

## 2023-05-11 DIAGNOSIS — M542 Cervicalgia: Secondary | ICD-10-CM | POA: Diagnosis not present

## 2023-05-11 DIAGNOSIS — M546 Pain in thoracic spine: Secondary | ICD-10-CM | POA: Diagnosis not present
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
## 2023-05-12 ENCOUNTER — Encounter: Payer: Self-pay | Admitting: Physical Therapy

## 2023-05-13 ENCOUNTER — Ambulatory Visit: Payer: Commercial Managed Care - PPO | Admitting: Physical Therapy

## 2023-05-18 ENCOUNTER — Ambulatory Visit: Payer: Commercial Managed Care - PPO | Admitting: Physical Therapy

## 2023-05-20 ENCOUNTER — Ambulatory Visit: Payer: Commercial Managed Care - PPO | Admitting: Physical Therapy

## 2023-05-20 DIAGNOSIS — M542 Cervicalgia: Secondary | ICD-10-CM | POA: Diagnosis not present

## 2023-05-20 DIAGNOSIS — M546 Pain in thoracic spine: Secondary | ICD-10-CM
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
## 2023-05-24 ENCOUNTER — Encounter: Payer: Self-pay | Admitting: Physical Therapy

## 2023-05-25 ENCOUNTER — Ambulatory Visit: Payer: Commercial Managed Care - PPO | Admitting: Physical Therapy

## 2023-05-25 DIAGNOSIS — M542 Cervicalgia: Secondary | ICD-10-CM

## 2023-05-25 DIAGNOSIS — M546 Pain in thoracic spine: Secondary | ICD-10-CM
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
## 2023-05-27 ENCOUNTER — Ambulatory Visit: Payer: Commercial Managed Care - PPO | Admitting: Physical Therapy

## 2023-05-28 ENCOUNTER — Other Ambulatory Visit: Payer: Self-pay

## 2023-05-28 MED ORDER — TRAMADOL HCL 50 MG PO TABS
50.0000 mg | ORAL_TABLET | Freq: Four times a day (QID) | ORAL | 0 refills | Status: DC | PRN
  Filled 2023-05-28: qty 30, 8d supply, fill #0

## 2023-06-02 ENCOUNTER — Telehealth (INDEPENDENT_AMBULATORY_CARE_PROVIDER_SITE_OTHER): Payer: Self-pay

## 2023-06-02 NOTE — Telephone Encounter (Signed)
Tried to call pt LVM for her to call back to office to get sclero appts scheduled

## 2023-06-03 ENCOUNTER — Ambulatory Visit: Payer: Commercial Managed Care - PPO | Admitting: Podiatry

## 2023-06-10 ENCOUNTER — Ambulatory Visit: Payer: Commercial Managed Care - PPO | Attending: Orthopedic Surgery | Admitting: Physical Therapy

## 2023-06-10 DIAGNOSIS — M546 Pain in thoracic spine: Secondary | ICD-10-CM | POA: Insufficient documentation

## 2023-06-10 DIAGNOSIS — M542 Cervicalgia: Secondary | ICD-10-CM | POA: Insufficient documentation
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
## 2023-06-16 ENCOUNTER — Encounter: Payer: Self-pay | Admitting: Physical Therapy

## 2023-06-17 ENCOUNTER — Ambulatory Visit: Payer: Commercial Managed Care - PPO | Admitting: Physical Therapy
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
## 2023-06-24 ENCOUNTER — Ambulatory Visit: Payer: Commercial Managed Care - PPO | Admitting: Physical Therapy

## 2023-06-24 DIAGNOSIS — M542 Cervicalgia: Secondary | ICD-10-CM | POA: Diagnosis not present

## 2023-06-24 DIAGNOSIS — M546 Pain in thoracic spine: Secondary | ICD-10-CM
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
## 2023-06-29 ENCOUNTER — Other Ambulatory Visit: Payer: Self-pay

## 2023-06-29 MED ORDER — ESTRADIOL 0.1 MG/GM VA CREA
TOPICAL_CREAM | VAGINAL | 1 refills | Status: DC
  Filled 2023-06-29: qty 42.5, 90d supply, fill #0

## 2023-06-30 ENCOUNTER — Other Ambulatory Visit: Payer: Self-pay

## 2023-06-30 MED ORDER — TRAMADOL HCL 50 MG PO TABS
50.0000 mg | ORAL_TABLET | Freq: Four times a day (QID) | ORAL | 0 refills | Status: DC | PRN
  Filled 2023-06-30: qty 30, 8d supply, fill #0

## 2023-07-01 ENCOUNTER — Ambulatory Visit: Payer: Commercial Managed Care - PPO | Admitting: Physical Therapy

## 2023-07-01 ENCOUNTER — Encounter: Payer: Self-pay | Admitting: Physical Therapy

## 2023-07-01 DIAGNOSIS — M542 Cervicalgia: Secondary | ICD-10-CM | POA: Diagnosis not present

## 2023-07-01 DIAGNOSIS — M546 Pain in thoracic spine: Secondary | ICD-10-CM | POA: Diagnosis not present

## 2023-07-02 ENCOUNTER — Ambulatory Visit (INDEPENDENT_AMBULATORY_CARE_PROVIDER_SITE_OTHER): Payer: Commercial Managed Care - PPO

## 2023-07-02 DIAGNOSIS — B351 Tinea unguium: Secondary | ICD-10-CM

## 2023-07-02 DIAGNOSIS — Z79899 Other long term (current) drug therapy: Secondary | ICD-10-CM

## 2023-07-02 DIAGNOSIS — L603 Nail dystrophy: Secondary | ICD-10-CM

## 2023-07-02 NOTE — Patient Instructions (Signed)
Follow-up care is important to the success of your foot laser treatment. Please keep these instructions for future reference.  ° ° °Normal activity can resume immediately.  ° °2. IF you doctor prescribed an antifungal cream apply medication to the skin on the bottom of your feet, sides of your feet, between your toes, and around your nails. (This cream is not intended for use on your nails) Apply cream as directed ° °3. IF a topical for your nails was prescribed by your doctor, apply to nail/nails once daily until nail is free from infection unless otherwise directed by your doctor. Maximum use for nail topical is 12 months.  ° °4. Spray the insides of your shoes with an over the counter antifungal spray or Lysol disinfectant (aerosol can) at the end of each day or use an ultra violet shoe sterilizer as directed. Try not to wear the same shoes everyday.  ° °5. Buy new nail clippers and files since your current pair may be infected. Metal nail care instruments may also be cleaned with diluted bleach or boiling water. DO NOT share nail clippers or nail files. ° °6. Keep your toenails trimmed and clean.  ° °7. Wear flip-flops in public places especially hotel rooms and showers, athletic club locker rooms and showers and indoor swimming pools.  ° °8. Avoid nail salons that do not clean their instruments properly or use a whirlpool system.   °

## 2023-07-02 NOTE — Progress Notes (Signed)
Patient presents today for the 1st laser treatment. Diagnosed with mycotic nail infection by Dr. Al Corpus.   Toenail most affected 1&2 on left foot and 1st toe on right foot.  All other systems are negative.  Nails were filed thin. Laser therapy was administered to 1st toenails bilateral and 2nd left and patient tolerated the treatment well. All safety precautions were in place.   Patient was given lab requisition for continuation of Lamisil therapy. She has upcoming appt next Thursday with Dr. Al Corpus.    Follow up in 4 weeks for laser # 2.

## 2023-07-06 ENCOUNTER — Ambulatory Visit (INDEPENDENT_AMBULATORY_CARE_PROVIDER_SITE_OTHER): Payer: Commercial Managed Care - PPO | Admitting: Nurse Practitioner

## 2023-07-07 ENCOUNTER — Ambulatory Visit: Payer: Commercial Managed Care - PPO | Admitting: Physical Therapy

## 2023-07-07 ENCOUNTER — Other Ambulatory Visit: Payer: Self-pay

## 2023-07-07 ENCOUNTER — Ambulatory Visit: Payer: Commercial Managed Care - PPO | Attending: Orthopedic Surgery | Admitting: Physical Therapy

## 2023-07-07 ENCOUNTER — Encounter: Payer: Self-pay | Admitting: Physical Therapy

## 2023-07-07 DIAGNOSIS — M546 Pain in thoracic spine: Secondary | ICD-10-CM | POA: Insufficient documentation

## 2023-07-07 DIAGNOSIS — Z79899 Other long term (current) drug therapy: Secondary | ICD-10-CM | POA: Diagnosis not present

## 2023-07-07 DIAGNOSIS — M542 Cervicalgia: Secondary | ICD-10-CM | POA: Diagnosis not present

## 2023-07-07 DIAGNOSIS — M50122 Cervical disc disorder at C5-C6 level with radiculopathy: Secondary | ICD-10-CM | POA: Diagnosis not present

## 2023-07-07 DIAGNOSIS — M5412 Radiculopathy, cervical region: Secondary | ICD-10-CM | POA: Diagnosis not present

## 2023-07-07 MED ORDER — GABAPENTIN 100 MG PO CAPS
100.0000 mg | ORAL_CAPSULE | Freq: Every day | ORAL | 1 refills | Status: DC
  Filled 2023-07-07: qty 30, 30d supply, fill #0
  Filled 2023-08-09: qty 30, 30d supply, fill #1
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
## 2023-07-08 ENCOUNTER — Ambulatory Visit: Payer: Commercial Managed Care - PPO | Admitting: Podiatry

## 2023-07-08 ENCOUNTER — Encounter: Payer: Commercial Managed Care - PPO | Admitting: Physical Therapy

## 2023-07-08 ENCOUNTER — Other Ambulatory Visit: Payer: Self-pay

## 2023-07-08 LAB — COMPREHENSIVE METABOLIC PANEL
ALT: 17 [IU]/L (ref 0–32)
AST: 25 [IU]/L (ref 0–40)
Albumin: 4.4 g/dL (ref 3.8–4.9)
Alkaline Phosphatase: 124 [IU]/L — ABNORMAL HIGH (ref 44–121)
BUN/Creatinine Ratio: 16 (ref 9–23)
BUN: 14 mg/dL (ref 6–24)
Bilirubin Total: 0.3 mg/dL (ref 0.0–1.2)
CO2: 26 mmol/L (ref 20–29)
Calcium: 9.9 mg/dL (ref 8.7–10.2)
Chloride: 100 mmol/L (ref 96–106)
Creatinine, Ser: 0.86 mg/dL (ref 0.57–1.00)
Globulin, Total: 2.3 g/dL (ref 1.5–4.5)
Glucose: 87 mg/dL (ref 70–99)
Potassium: 4 mmol/L (ref 3.5–5.2)
Sodium: 141 mmol/L (ref 134–144)
Total Protein: 6.7 g/dL (ref 6.0–8.5)
eGFR: 79 mL/min/{1.73_m2} (ref >59)

## 2023-07-08 MED ORDER — TERBINAFINE HCL 250 MG PO TABS
250.0000 mg | ORAL_TABLET | Freq: Every day | ORAL | 0 refills | Status: DC
  Filled 2023-09-09: qty 60, 60d supply, fill #0

## 2023-07-15 ENCOUNTER — Telehealth: Payer: Self-pay

## 2023-07-15 ENCOUNTER — Ambulatory Visit: Payer: Commercial Managed Care - PPO

## 2023-07-15 DIAGNOSIS — M542 Cervicalgia: Secondary | ICD-10-CM

## 2023-07-15 DIAGNOSIS — M546 Pain in thoracic spine: Secondary | ICD-10-CM | POA: Diagnosis not present
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
## 2023-07-15 NOTE — Telephone Encounter (Signed)
Author called pt today to verify appointment scheduled appointment. Author saw note from ortho PA, plan mentioned ' taking a break from PT.' Pt reports intention to continue to come to PT, feels it has been beneficial overall. Pt reports she is still waiting for pain management scheduling for [facility], they have not called yet.   10:59 AM, 07/15/23 Elizabeth Good, PT, DPT Physical Therapist - [facility] in [place]  [phone] (Office)

## 2023-07-22 ENCOUNTER — Ambulatory Visit: Payer: Commercial Managed Care - PPO | Admitting: Physical Therapy

## 2023-07-22 ENCOUNTER — Ambulatory Visit (INDEPENDENT_AMBULATORY_CARE_PROVIDER_SITE_OTHER): Payer: Commercial Managed Care - PPO | Admitting: Vascular Surgery

## 2023-07-22 DIAGNOSIS — M542 Cervicalgia: Secondary | ICD-10-CM | POA: Diagnosis not present

## 2023-07-22 DIAGNOSIS — M546 Pain in thoracic spine: Secondary | ICD-10-CM
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
## 2023-07-26 ENCOUNTER — Other Ambulatory Visit: Payer: Self-pay

## 2023-07-26 MED ORDER — TRAMADOL HCL 50 MG PO TABS
50.0000 mg | ORAL_TABLET | Freq: Four times a day (QID) | ORAL | 0 refills | Status: DC | PRN
  Filled 2023-07-26: qty 30, 8d supply, fill #0

## 2023-07-27 ENCOUNTER — Encounter: Payer: Self-pay | Admitting: Physical Therapy

## 2023-07-27 ENCOUNTER — Ambulatory Visit: Payer: Commercial Managed Care - PPO | Admitting: Physical Therapy

## 2023-07-27 DIAGNOSIS — M546 Pain in thoracic spine: Secondary | ICD-10-CM

## 2023-07-27 DIAGNOSIS — M542 Cervicalgia: Secondary | ICD-10-CM | POA: Diagnosis not present
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
## 2023-08-04 ENCOUNTER — Encounter: Payer: Self-pay | Admitting: Physical Therapy

## 2023-08-04 ENCOUNTER — Ambulatory Visit: Payer: Commercial Managed Care - PPO | Attending: Orthopedic Surgery | Admitting: Physical Therapy

## 2023-08-04 DIAGNOSIS — M546 Pain in thoracic spine: Secondary | ICD-10-CM | POA: Diagnosis not present

## 2023-08-04 DIAGNOSIS — M542 Cervicalgia: Secondary | ICD-10-CM | POA: Diagnosis not present
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
## 2023-08-05 ENCOUNTER — Ambulatory Visit (INDEPENDENT_AMBULATORY_CARE_PROVIDER_SITE_OTHER): Payer: Commercial Managed Care - PPO | Admitting: Nurse Practitioner

## 2023-08-06 ENCOUNTER — Ambulatory Visit (INDEPENDENT_AMBULATORY_CARE_PROVIDER_SITE_OTHER): Payer: Commercial Managed Care - PPO | Admitting: Podiatrist

## 2023-08-06 DIAGNOSIS — L603 Nail dystrophy: Secondary | ICD-10-CM

## 2023-08-06 NOTE — Progress Notes (Signed)
Patient presents today for the 2nd  laser treatment. Diagnosed with mycotic nail infection by Dr. Al Corpus.   Toenail most affected 1&2 on left foot and 1st toe on right foot.  All other systems are negative.  Nails were filed thin. Laser therapy was administered to 1st toenails bilateral and 2nd left and patient tolerated the treatment well. All safety precautions were in place.   Single pass laser treatment administered to all non affected nails.    Follow up in 4 weeks for laser # 2.  ( She already has her appointments for laser 3 and 4 scheduled)

## 2023-08-12 ENCOUNTER — Encounter: Payer: Self-pay | Admitting: Physical Therapy

## 2023-08-12 ENCOUNTER — Ambulatory Visit: Payer: Commercial Managed Care - PPO | Admitting: Physical Therapy

## 2023-08-12 DIAGNOSIS — M546 Pain in thoracic spine: Secondary | ICD-10-CM

## 2023-08-12 DIAGNOSIS — M542 Cervicalgia: Secondary | ICD-10-CM
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
## 2023-08-18 ENCOUNTER — Ambulatory Visit: Payer: Commercial Managed Care - PPO | Admitting: Physical Therapy
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
## 2023-08-19 ENCOUNTER — Ambulatory Visit (INDEPENDENT_AMBULATORY_CARE_PROVIDER_SITE_OTHER): Payer: Commercial Managed Care - PPO | Admitting: Nurse Practitioner

## 2023-08-19 VITALS — BP 136/67 | HR 87 | Resp 16 | Wt 165.4 lb

## 2023-08-19 DIAGNOSIS — I83813 Varicose veins of bilateral lower extremities with pain: Secondary | ICD-10-CM | POA: Diagnosis not present

## 2023-08-23 ENCOUNTER — Encounter: Payer: Self-pay | Admitting: Student in an Organized Health Care Education/Training Program

## 2023-08-23 ENCOUNTER — Ambulatory Visit
Admission: RE | Admit: 2023-08-23 | Discharge: 2023-08-23 | Disposition: A | Discharge status: Home / Self Care | Payer: Commercial Managed Care - PPO | Source: Ambulatory Visit | Attending: Student in an Organized Health Care Education/Training Program | Admitting: Student in an Organized Health Care Education/Training Program

## 2023-08-23 ENCOUNTER — Encounter: Payer: Commercial Managed Care - PPO | Admitting: Physical Therapy

## 2023-08-23 ENCOUNTER — Ambulatory Visit
Payer: Commercial Managed Care - PPO | Attending: Student in an Organized Health Care Education/Training Program | Admitting: Student in an Organized Health Care Education/Training Program

## 2023-08-23 ENCOUNTER — Encounter (INDEPENDENT_AMBULATORY_CARE_PROVIDER_SITE_OTHER): Payer: Self-pay | Admitting: Nurse Practitioner

## 2023-08-23 VITALS — BP 148/81 | HR 71 | Temp 98.1°F | Resp 15 | Ht 64.0 in | Wt 164.0 lb

## 2023-08-23 DIAGNOSIS — M4802 Spinal stenosis, cervical region: Secondary | ICD-10-CM | POA: Diagnosis not present

## 2023-08-23 DIAGNOSIS — M5412 Radiculopathy, cervical region: Secondary | ICD-10-CM | POA: Diagnosis not present

## 2023-08-23 DIAGNOSIS — G894 Chronic pain syndrome: Secondary | ICD-10-CM | POA: Insufficient documentation

## 2023-08-23 MED ORDER — SODIUM CHLORIDE 0.9% FLUSH
1.0000 mL | Freq: Once | INTRAVENOUS | Status: AC
  Administered 2023-08-23: 1 mL

## 2023-08-23 MED ORDER — SODIUM CHLORIDE (PF) 0.9 % IJ SOLN
INTRAMUSCULAR | Status: AC
  Filled 2023-08-23: qty 10

## 2023-08-23 MED ORDER — MIDAZOLAM HCL 2 MG/2ML IJ SOLN
0.5000 mg | Freq: Once | INTRAMUSCULAR | Status: AC
  Administered 2023-08-23: 2 mg via INTRAVENOUS

## 2023-08-23 MED ORDER — IOHEXOL 180 MG/ML  SOLN
10.0000 mL | Freq: Once | INTRAMUSCULAR | Status: AC
  Administered 2023-08-23: 10 mL via EPIDURAL
  Filled 2023-08-23: qty 20

## 2023-08-23 MED ORDER — LACTATED RINGERS IV SOLN: Freq: Once | INTRAVENOUS | Status: AC

## 2023-08-23 MED ORDER — LIDOCAINE HCL 2 % IJ SOLN
20.0000 mL | Freq: Once | INTRAMUSCULAR | Status: AC
  Administered 2023-08-23: 200 mg
  Filled 2023-08-23: qty 40

## 2023-08-23 MED ORDER — DEXAMETHASONE SODIUM PHOSPHATE 10 MG/ML IJ SOLN
10.0000 mg | Freq: Once | INTRAMUSCULAR | Status: AC
  Administered 2023-08-23: 10 mg
  Filled 2023-08-23: qty 1

## 2023-08-23 MED ORDER — ROPIVACAINE HCL 2 MG/ML IJ SOLN
1.0000 mL | Freq: Once | INTRAMUSCULAR | Status: AC
  Administered 2023-08-23: 1 mL via EPIDURAL
  Filled 2023-08-23: qty 20

## 2023-08-23 MED ORDER — MIDAZOLAM HCL 2 MG/2ML IJ SOLN
INTRAMUSCULAR | Status: AC
Start: 2023-08-23 — End: ?
  Filled 2023-08-23: qty 2

## 2023-08-23 NOTE — Progress Notes (Signed)
PROVIDER NOTE: Interpretation of information contained herein should be left to medically-trained personnel. Specific patient instructions are provided elsewhere under "Patient Instructions" section of medical record. This document was created in part using STT-dictation technology, any transcriptional errors that may result from this process are unintentional.  Patient: Elizabeth Good Type: Established DOB: April 04, 1966 MRN: [id number] PCP: Leim Fabry, MD  Service: Procedure DOS: 08/23/2023 Setting: Ambulatory Location: Ambulatory outpatient facility Delivery: Face-to-face Provider: Edward Jolly, MD Specialty: Interventional Pain Management Specialty designation: 09 Location: Outpatient facility Ref. Prov.: Dedra Skeens, PA-C       Interventional Therapy   Procedure: Cervical Epidural Steroid injection (CESI) (Interlaminar) #1  Laterality: Right  Level: C7-T1 Imaging: Fluoroscopy-assisted DOS: 08/23/2023  Performed by: Edward Jolly, MD Anesthesia: Local anesthesia (1-2% Lidocaine) Sedation: Minimal Sedation                         Purpose: Diagnostic/Therapeutic Indications: Cervicalgia, cervical radicular pain, degenerative disc disease, severe enough to impact quality of life or function. 1. Cervical radicular pain (right)   2. Foraminal stenosis of cervical region   3. Chronic pain syndrome    NAS-11 score:   Pre-procedure: 2 /10   Post-procedure: 0-No pain/10      Position  Prep  Materials:  Location setting: Procedure suite Position: Prone, on modified reverse trendelenburg to facilitate breathing, with head in head-cradle. Pillows positioned under chest (below chin-level) with cervical spine flexed. Safety Precautions: Patient was assessed for positional comfort and pressure points before starting the procedure. Prepping solution: DuraPrep (Iodine Povacrylex [0.7% available iodine] and Isopropyl Alcohol, 74% w/w) Prep Area: Entire  cervicothoracic  region Approach: percutaneous, paramedial Intended target: Posterior cervical epidural space Materials Procedure:  Tray: Epidural Needle(s): Epidural (Tuohy) Qty: 1 Length: (90mm) 3.5-inch Gauge: 22G   H&P (Pre-op Assessment):  Elizabeth Good is a 57 y.o. (year old), female patient, seen today for interventional treatment. She  has a past surgical history that includes Tonsillectomy; Cholecystectomy (2011); Gastric bypass (2011); Bladder surgery; Rectal surgery; Foot surgery (Left); Septoplasty; Incontinence surgery; Anterior and posterior vaginal repair; Endometrial ablation (2011); Ganglion cyst excision; Combined hysteroscopy diagnostic / D&C (12/06/2014); Extracorporeal shock wave lithotripsy (Right, 08/25/2018); Breast biopsy (Left, 06/29/2019); Breast biopsy (Left, 07/14/2019); Laparoscopic bilateral salpingectomy (08/08/2019); biopsy (N/A, 08/08/2019); Oophorectomy (Left, 2020); Lithotripsy (2016); Colonoscopy with propofol (N/A, 02/07/2021); polypectomy (N/A, 02/07/2021); Breast biopsy (Left, 08/11/2019); and Breast excisional biopsy (Left, 09/21/2019). Elizabeth Good has a current medication list which includes the following prescription(s): cetirizine, vitamin d3, cyanocobalamin, gabapentin, lamotrigine, multivitamin, pantoprazole, wegovy, terbinafine, tramadol, celecoxib, b-12, estradiol, meloxicam, pantoprazole, tizanidine, tramadol, tramadol, and tramadol, and the following Facility-Administered Medications: lactated ringers. Her primarily concern today is the Neck Pain (Neck, right shoulder and head pain)  Initial Vital Signs:  Pulse/HCG Rate: 71ECG Heart Rate: 78 Temp: 98.1 F (36.7 C) Resp: 15 BP: (!) 151/92 SpO2: 100 %  BMI: Estimated body mass index is 28.15 kg/m as calculated from the following:   Height as of this encounter: 5\' 4"  (1.626 m).   Weight as of this encounter: 164 lb (74.4 kg).  Risk Assessment: Allergies: Reviewed. She is allergic to dermabase [albolene], other,  latex, penicillins, and sulfa antibiotics.  Allergy Precautions: None required Coagulopathies: Reviewed. None identified.  Blood-thinner therapy: None at this time Active Infection(s): Reviewed. None identified. Elizabeth Good is afebrile  Site Confirmation: Elizabeth Good was asked to confirm the procedure and laterality before marking the site Procedure checklist: Completed Consent: Before the procedure and under the  influence of no sedative(s), amnesic(s), or anxiolytics, the patient was informed of the treatment options, risks and possible complications. To fulfill our ethical and legal obligations, as recommended by the American Medical Association's Code of Ethics, I have informed the patient of my clinical impression; the nature and purpose of the treatment or procedure; the risks, benefits, and possible complications of the intervention; the alternatives, including doing nothing; the risk(s) and benefit(s) of the alternative treatment(s) or procedure(s); and the risk(s) and benefit(s) of doing nothing. The patient was provided information about the general risks and possible complications associated with the procedure. These may include, but are not limited to: failure to achieve desired goals, infection, bleeding, organ or nerve damage, allergic reactions, paralysis, and death. In addition, the patient was informed of those risks and complications associated to Spine-related procedures, such as failure to decrease pain; infection (i.e.: Meningitis, epidural or intraspinal abscess); bleeding (i.e.: epidural hematoma, subarachnoid hemorrhage, or any other type of intraspinal or peri-dural bleeding); organ or nerve damage (i.e.: Any type of peripheral nerve, nerve root, or spinal cord injury) with subsequent damage to sensory, motor, and/or autonomic systems, resulting in permanent pain, numbness, and/or weakness of one or several areas of the body; allergic reactions; (i.e.: anaphylactic reaction); and/or  death. Furthermore, the patient was informed of those risks and complications associated with the medications. These include, but are not limited to: allergic reactions (i.e.: anaphylactic or anaphylactoid reaction(s)); adrenal axis suppression; blood sugar elevation that in diabetics may result in ketoacidosis or comma; water retention that in patients with history of congestive heart failure may result in shortness of breath, pulmonary edema, and decompensation with resultant heart failure; weight gain; swelling or edema; medication-induced neural toxicity; particulate matter embolism and blood vessel occlusion with resultant organ, and/or nervous system infarction; and/or aseptic necrosis of one or more joints. Finally, the patient was informed that Medicine is not an exact science; therefore, there is also the possibility of unforeseen or unpredictable risks and/or possible complications that may result in a catastrophic outcome. The patient indicated having understood very clearly. We have given the patient no guarantees and we have made no promises. Enough time was given to the patient to ask questions, all of which were answered to the patient's satisfaction. Elizabeth Good has indicated that she wanted to continue with the procedure. Attestation: I, the ordering provider, attest that I have discussed with the patient the benefits, risks, side-effects, alternatives, likelihood of achieving goals, and potential problems during recovery for the procedure that I have provided informed consent. Date  Time: 08/23/2023  8:05 AM   Pre-Procedure Preparation:  Monitoring: As per clinic protocol. Respiration, ETCO2, SpO2, BP, heart rate and rhythm monitor placed and checked for adequate function Safety Precautions: Patient was assessed for positional comfort and pressure points before starting the procedure. Time-out: I initiated and conducted the "Time-out" before starting the procedure, as per protocol. The  patient was asked to participate by confirming the accuracy of the "Time Out" information. Verification of the correct person, site, and procedure were performed and confirmed by me, the nursing staff, and the patient. "Time-out" conducted as per Joint Commission's Universal Protocol (UP.01.01.01). Time: 0952 Start Time: 0952 hrs.  Description  Narrative of Procedure:          Rationale (medical necessity): procedure needed and proper for the diagnosis and/or treatment of the patient's medical symptoms and needs. Start Time: 0952 hrs. Safety Precautions: Aspiration looking for blood return was conducted prior to all injections. At no  point did we inject any substances, as a needle was being advanced. No attempts were made at seeking any paresthesias. Safe injection practices and needle disposal techniques used. Medications properly checked for expiration dates. SDV (single dose vial) medications used. Description of procedure: Protocol guidelines were followed. The patient was assisted into a comfortable position. The target area was identified and the area prepped in the usual manner. Skin & deeper tissues infiltrated with local anesthetic. Appropriate amount of time allowed to pass for local anesthetics to take effect. Using fluoroscopic guidance, the epidural needle was introduced through the skin, ipsilateral to the reported pain, and advanced to the target area. Posterior laminar os was contacted and the needle walked caudad, until the lamina was cleared. The ligamentum flavum was engaged and the epidural space identified using "loss-of-resistance technique" with 2-3 ml of PF-NaCl (0.9% NSS), in a 5cc dedicated LOR syringe. (See "Imaging guidance" below for use of contrast details.) Once proper needle placement was secured, and negative aspiration confirmed, the solution was injected in intermittent fashion, asking for systemic symptoms every 0.5cc. The needles were then removed and the area cleansed,  making sure to leave some of the prepping solution back to take advantage of its long term bactericidal properties.  3 cc solution made of 1 cc of preservative-free saline, 1 cc of 0.2% ropivacaine, 1 cc of Decadron 10 mg/cc.   Vitals:   08/23/23 0813 08/23/23 0900 08/23/23 0951 08/23/23 0956  BP: (!) 151/92 (!) 150/92 (!) 150/92 (!) 148/81  Pulse: 71     Resp:  15 13 15   Temp: 98.1 F (36.7 C)     SpO2: 100% 100% 99% 100%  Weight:      Height:         End Time: 0956 hrs.  Imaging Guidance (Spinal):          Type of Imaging Technique: Fluoroscopy Guidance (Spinal) Indication(s): Fluoroscopy guidance for needle placement to enhance accuracy in procedures requiring precise needle localization for targeted delivery of medication in or near specific anatomical locations not easily accessible without such real-time imaging assistance. Exposure Time: Please see nurses notes. Contrast: Before injecting any contrast, we confirmed that the patient did not have an allergy to iodine, shellfish, or radiological contrast. Once satisfactory needle placement was completed at the desired level, radiological contrast was injected. Contrast injected under live fluoroscopy. No contrast complications. See chart for type and volume of contrast used. Fluoroscopic Guidance: I was personally present during the use of fluoroscopy. "Tunnel Vision Technique" used to obtain the best possible view of the target area. Parallax error corrected before commencing the procedure. "Direction-depth-direction" technique used to introduce the needle under continuous pulsed fluoroscopy. Once target was reached, antero-posterior, oblique, and lateral fluoroscopic projection used confirm needle placement in all planes. Images permanently stored in EMR. Interpretation: I personally interpreted the imaging intraoperatively. Adequate needle placement confirmed in multiple planes. Appropriate spread of contrast into desired area was  observed. No evidence of afferent or efferent intravascular uptake. No intrathecal or subarachnoid spread observed. Permanent images saved into the patient's record.  Post-operative Assessment:  Post-procedure Vital Signs:  Pulse/HCG Rate: 7179 Temp: 98.1 F (36.7 C) Resp: 15 BP: (!) 148/81 SpO2: 100 %  EBL: None  Complications: No immediate post-treatment complications observed by team, or reported by patient.  Note: The patient tolerated the entire procedure well. A repeat set of vitals were taken after the procedure and the patient was kept under observation following institutional policy, for this type of  procedure. Post-procedural neurological assessment was performed, showing return to baseline, prior to discharge. The patient was provided with post-procedure discharge instructions, including a section on how to identify potential problems. Should any problems arise concerning this procedure, the patient was given instructions to immediately contact us, at any time, without hesitation. In any case, we plan to contact the patient by telephone for a follow-up status report regarding this interventional procedure.  Comments:  No additional relevant information.  Plan of Care (POC)  Orders:  Orders Placed This Encounter  Procedures   Cervical Epidural Injection    Standing Status:   Future    Expected Date:   08/23/2023    Expiration Date:   11/21/2023    Scheduling Instructions:     Right C-ESI    Where will this procedure be performed?:   [facility]   [facility] C-ARM 1-60 MIN NO REPORT    Intraoperative interpretation by procedural physician at [facility].    Standing Status:   Standing    Number of Occurrences:   1    Reason for exam::   Assistance in needle guidance and placement for procedures requiring needle placement in or near specific anatomical locations not easily accessible without such assistance.     Medications ordered for  procedure: Meds ordered this encounter  Medications   iohexol (OMNIPAQUE) 180 MG/ML injection 10 mL    Must be Myelogram-compatible. If not available, you may substitute with a water-soluble, non-ionic, hypoallergenic, myelogram-compatible radiological contrast medium.   lidocaine (XYLOCAINE) 2 % (with pres) injection 400 mg   lactated ringers infusion   ropivacaine (PF) 2 mg/mL (0.2%) (NAROPIN) injection 1 mL   sodium chloride flush (NS) 0.9 % injection 1 mL   dexamethasone (DECADRON) injection 10 mg   midazolam (VERSED) injection 0.5-2 mg    Make sure Flumazenil is available in the pyxis when using this medication. If oversedation occurs, administer 0.2 mg IV over 15 sec. If after 45 sec no response, administer 0.2 mg again over 1 min; may repeat at 1 min intervals; not to exceed 4 doses (1 mg)   Medications administered: We administered iohexol, lidocaine, lactated ringers, ropivacaine (PF) 2 mg/mL (0.2%), sodium chloride flush, dexamethasone, and midazolam.  See the medical record for exact dosing, route, and time of administration.  Follow-up plan:   Return in about 4 weeks (around 09/20/2023) for PPE, F2F.       Right C-ESI 08/23/23    Recent Visits No visits were found meeting these conditions. Showing recent visits within past 90 days and meeting all other requirements Today's Visits Date Type Provider Dept  08/23/23 Office Visit Edward Jolly, MD [facility]  Showing today's visits and meeting all other requirements Future Appointments No visits were found meeting these conditions. Showing future appointments within next 90 days and meeting all other requirements  Disposition: Discharge home  Discharge (Date  Time): 08/23/2023;   hrs.   Primary Care Physician: Leim Fabry, MD Location: [facility] Note by: Edward Jolly, MD (TTS technology used. I apologize for any typographical errors that were not detected and  corrected.) Date: 08/23/2023; Time: 10:05 AM  Disclaimer:  Medicine is not an Visual merchandiser. The only guarantee in medicine is that nothing is guaranteed. It is important to note that the decision to proceed with this intervention was based on the information collected from the patient. The Data and conclusions were drawn from the patient's questionnaire, the interview, and the  physical examination. Because the information was provided in large part by the patient, it cannot be guaranteed that it has not been purposely or unconsciously manipulated. Every effort has been made to obtain as much relevant data as possible for this evaluation. It is important to note that the conclusions that lead to this procedure are derived in large part from the available data. Always take into account that the treatment will also be dependent on availability of resources and existing treatment guidelines, considered by other Pain Management Practitioners as being common knowledge and practice, at the time of the intervention. For Medico-Legal purposes, it is also important to point out that variation in procedural techniques and pharmacological choices are the acceptable norm. The indications, contraindications, technique, and results of the above procedure should only be interpreted and judged by a Board-Certified Interventional Pain Specialist with extensive familiarity and expertise in the same exact procedure and technique.

## 2023-08-23 NOTE — Patient Instructions (Signed)

## 2023-08-23 NOTE — Progress Notes (Signed)
Safety precautions to be maintained throughout the outpatient stay will include: orient to surroundings, keep bed in low position, maintain call bell within reach at all times, provide assistance with transfer out of bed and ambulation.  

## 2023-08-23 NOTE — Progress Notes (Signed)
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   Procedure: Sclerotherapy using hypertonic saline mixed with 1% Lidocaine was performed on the bilateral lower extremity. Compression wraps were placed. The patient tolerated the procedure well. 

## 2023-08-23 NOTE — Progress Notes (Signed)
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 this visit: We administered iohexol, lidocaine, lactated ringers, ropivacaine (PF) 2 mg/mL (0.2%), sodium chloride flush, and dexamethasone.  Provider-requested follow-up: Return in about 4 weeks (around 09/20/2023) for PPE, F2F.  Future Appointments  Date Time Provider Department Center  08/27/2023  8:45 AM Consuela Mimes T, PT [facility] None  08/30/2023  3:00 PM Consuela Mimes T, PT [facility] None  09/03/2023 12:00 PM [facility]  09/14/2023  2:30 PM Georgiana Spinner, NP [facility] None  10/08/2023  1:30 PM [facility]  10/14/2023  2:00 PM Georgiana Spinner, NP [facility] None  10/21/2023  2:30 PM [facility] None  10/26/2023  2:00 PM Rickard Patience, MD [facility] None  10/26/2023  2:30 PM [facility]FUSION CHAIR 1 [facility] None  11/04/2023  2:15 PM Hyatt, Max T, DPM [facility]    Duration of encounter: .   Total time on encounter, as per AMA guidelines included both the face-to-face and non-face-to-face time personally spent by the physician and/or other qualified health care professional(s) on the day of the encounter (includes time  in activities that require the physician or other qualified health care professional and does not include time in activities normally performed by clinical staff). Physician's time may include the following activities when performed: Preparing to see the patient (e.g., pre-charting review of records, searching for previously ordered imaging, lab work, and nerve conduction tests) Review of prior analgesic pharmacotherapies. Reviewing PMP Interpreting ordered tests (e.g., lab work, imaging, nerve conduction tests) Performing post-procedure evaluations, including interpretation of diagnostic procedures Obtaining and/or reviewing separately obtained history Performing a medically appropriate examination and/or evaluation Counseling and educating the patient/family/caregiver Ordering medications, tests, or procedures Referring and communicating with other health care professionals (when not separately reported) Documenting clinical information in the electronic or other health record Independently interpreting results (not separately reported) and communicating results to the patient/ family/caregiver Care coordination (not separately reported)  Note by: Edward Jolly, MD (AI and TTS technology used. I apologize for any typographical errors that were not detected and corrected.) Date: 08/23/2023; Time: 10:02 AM

## 2023-08-26 ENCOUNTER — Other Ambulatory Visit: Payer: Self-pay

## 2023-08-26 MED ORDER — TRAMADOL HCL 50 MG PO TABS
50.0000 mg | ORAL_TABLET | Freq: Four times a day (QID) | ORAL | 0 refills | Status: DC | PRN
  Filled 2023-08-26: qty 20, 5d supply, fill #0

## 2023-08-27 ENCOUNTER — Ambulatory Visit: Payer: Commercial Managed Care - PPO | Admitting: Physical Therapy

## 2023-08-30 ENCOUNTER — Encounter: Payer: Commercial Managed Care - PPO | Admitting: Physical Therapy

## 2023-08-31 ENCOUNTER — Other Ambulatory Visit: Payer: Self-pay

## 2023-09-02 ENCOUNTER — Ambulatory Visit: Payer: Commercial Managed Care - PPO | Admitting: Physical Therapy

## 2023-09-02 ENCOUNTER — Other Ambulatory Visit: Payer: Self-pay

## 2023-09-03 ENCOUNTER — Ambulatory Visit (INDEPENDENT_AMBULATORY_CARE_PROVIDER_SITE_OTHER): Payer: Commercial Managed Care - PPO | Admitting: *Deleted

## 2023-09-03 DIAGNOSIS — L603 Nail dystrophy: Secondary | ICD-10-CM

## 2023-09-03 NOTE — Progress Notes (Signed)
 Patient presents today for the 3rd laser treatment. Diagnosed with mycotic nail infection by Dr. Verta.   Toenail most affected 1&2 on left foot and 1st toe on right foot.  All other systems are negative.  Nails were filed thin. Laser therapy was administered to 1-5 bilateral and patient tolerated the treatment well. All safety precautions were in place.    Follow up in 6 weeks for laser # 4  She will go ahead and make her next 2 appointments

## 2023-09-08 ENCOUNTER — Other Ambulatory Visit: Payer: Self-pay

## 2023-09-08 ENCOUNTER — Ambulatory Visit: Payer: Commercial Managed Care - PPO | Attending: Orthopedic Surgery | Admitting: Physical Therapy

## 2023-09-08 DIAGNOSIS — M542 Cervicalgia: Secondary | ICD-10-CM | POA: Diagnosis not present

## 2023-09-08 DIAGNOSIS — M546 Pain in thoracic spine: Secondary | ICD-10-CM | POA: Insufficient documentation

## 2023-09-08 MED ORDER — GABAPENTIN 100 MG PO CAPS
100.0000 mg | ORAL_CAPSULE | Freq: Every day | ORAL | 1 refills | Status: DC
  Filled 2023-09-08: qty 30, 30d supply, fill #0
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
## 2023-09-09 ENCOUNTER — Other Ambulatory Visit: Payer: Self-pay

## 2023-09-10 ENCOUNTER — Encounter: Payer: Self-pay | Admitting: Physical Therapy

## 2023-09-14 ENCOUNTER — Ambulatory Visit (INDEPENDENT_AMBULATORY_CARE_PROVIDER_SITE_OTHER): Payer: Commercial Managed Care - PPO | Admitting: Nurse Practitioner
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
## 2023-09-15 ENCOUNTER — Other Ambulatory Visit: Payer: Self-pay

## 2023-09-15 ENCOUNTER — Ambulatory Visit: Payer: Commercial Managed Care - PPO | Admitting: Physical Therapy

## 2023-09-15 ENCOUNTER — Encounter: Payer: Self-pay | Admitting: Physical Therapy

## 2023-09-15 DIAGNOSIS — M542 Cervicalgia: Secondary | ICD-10-CM

## 2023-09-15 DIAGNOSIS — M546 Pain in thoracic spine: Secondary | ICD-10-CM

## 2023-09-15 MED ORDER — TRAMADOL HCL 50 MG PO TABS
50.0000 mg | ORAL_TABLET | Freq: Four times a day (QID) | ORAL | 0 refills | Status: DC | PRN
  Filled 2023-09-15: qty 30, 8d supply, fill #0

## 2023-09-20 ENCOUNTER — Ambulatory Visit
Payer: Commercial Managed Care - PPO | Attending: Student in an Organized Health Care Education/Training Program | Admitting: Student in an Organized Health Care Education/Training Program

## 2023-09-20 ENCOUNTER — Encounter: Payer: Self-pay | Admitting: Student in an Organized Health Care Education/Training Program

## 2023-09-20 ENCOUNTER — Ambulatory Visit: Payer: Commercial Managed Care - PPO

## 2023-09-20 VITALS — BP 153/91 | HR 80 | Temp 97.7°F | Resp 16 | Ht 64.0 in | Wt 161.0 lb

## 2023-09-20 DIAGNOSIS — M542 Cervicalgia: Secondary | ICD-10-CM | POA: Diagnosis not present

## 2023-09-20 DIAGNOSIS — M5412 Radiculopathy, cervical region: Secondary | ICD-10-CM

## 2023-09-20 DIAGNOSIS — G894 Chronic pain syndrome: Secondary | ICD-10-CM

## 2023-09-20 DIAGNOSIS — M4802 Spinal stenosis, cervical region: Secondary | ICD-10-CM

## 2023-09-20 DIAGNOSIS — M546 Pain in thoracic spine: Secondary | ICD-10-CM | POA: Diagnosis not present

## 2023-09-20 NOTE — Patient Instructions (Signed)
GENERAL RISKS AND COMPLICATIONS ° °What are the risk, side effects and possible complications? °Generally speaking, most procedures are safe.  However, with any procedure there are risks, side effects, and the possibility of complications.  The risks and complications are dependent upon the sites that are lesioned, or the type of nerve block to be performed.  The closer the procedure is to the spine, the more serious the risks are.  Great care is taken when placing the radio frequency needles, block needles or lesioning probes, but sometimes complications can occur. °Infection: Any time there is an injection through the skin, there is a risk of infection.  This is why sterile conditions are used for these blocks.  There are four possible types of infection. °Localized skin infection. °Central Nervous System Infection-This can be in the form of Meningitis, which can be deadly. °Epidural Infections-This can be in the form of an epidural abscess, which can cause pressure inside of the spine, causing compression of the spinal cord with subsequent paralysis. This would require an emergency surgery to decompress, and there are no guarantees that the patient would recover from the paralysis. °Discitis-This is an infection of the intervertebral discs.  It occurs in about 1% of discography procedures.  It is difficult to treat and it may lead to surgery. ° °      2. Pain: the needles have to go through skin and soft tissues, will cause soreness. °      3. Damage to internal structures:  The nerves to be lesioned may be near blood vessels or   ° other nerves which can be potentially damaged. °      4. Bleeding: Bleeding is more common if the patient is taking blood thinners such as  aspirin, Coumadin, Ticiid, Plavix, etc., or if he/she have some genetic predisposition  such as hemophilia. Bleeding into the spinal canal can cause compression of the spinal  cord with subsequent paralysis.  This would require an emergency  surgery to  decompress and there are no guarantees that the patient would recover from the  paralysis. °      5. Pneumothorax:  Puncturing of a lung is a possibility, every time a needle is introduced in  the area of the chest or upper back.  Pneumothorax refers to free air around the  collapsed lung(s), inside of the thoracic cavity (chest cavity).  Another two possible  complications related to a similar event would include: Hemothorax and Chylothorax.   These are variations of the Pneumothorax, where instead of air around the collapsed  lung(s), you may have blood or chyle, respectively. °      6. Spinal headaches: They may occur with any procedures in the area of the spine. °      7. Persistent CSF (Cerebro-Spinal Fluid) leakage: This is a rare problem, but may occur  with prolonged intrathecal or epidural catheters either due to the formation of a fistulous  track or a dural tear. °      8. Nerve damage: By working so close to the spinal cord, there is always a possibility of  nerve damage, which could be as serious as a permanent spinal cord injury with  paralysis. °      9. Death:  Although rare, severe deadly allergic reactions known as "Anaphylactic  reaction" can occur to any of the medications used. °     10. Worsening of the symptoms:  We can always make thing worse. ° °What are the chances   of something like this happening? Chances of any of this occuring are extremely low.  By statistics, you have more of a chance of getting killed in a motor vehicle accident: while driving to the hospital than any of the above occurring .  Nevertheless, you should be aware that they are possibilities.  In general, it is similar to taking a shower.  Everybody knows that you can slip, hit your head and get killed.  Does that mean that you should not shower again?  Nevertheless always keep in mind that statistics do not mean anything if you happen to be on the wrong side of them.  Even if a procedure has a 1 (one) in a  1,000,000 (million) chance of going wrong, it you happen to be that one..Also, keep in mind that by statistics, you have more of a chance of having something go wrong when taking medications.  Who should not have this procedure? If you are on a blood thinning medication (e.g. Coumadin, Plavix, see list of "Blood Thinners"), or if you have an active infection going on, you should not have the procedure.  If you are taking any blood thinners, please inform your physician.  How should I prepare for this procedure? Do not eat or drink anything at least six hours prior to the procedure. Bring a driver with you .  It cannot be a taxi. Come accompanied by an adult that can drive you back, and that is strong enough to help you if your legs get weak or numb from the local anesthetic. Take all of your medicines the morning of the procedure with just enough water to swallow them. If you have diabetes, make sure that you are scheduled to have your procedure done first thing in the morning, whenever possible. If you have diabetes, take only half of your insulin dose and notify our nurse that you have done so as soon as you arrive at the clinic. If you are diabetic, but only take blood sugar pills (oral hypoglycemic), then do not take them on the morning of your procedure.  You may take them after you have had the procedure. Do not take aspirin or any aspirin-containing medications, at least eleven (11) days prior to the procedure.  They may prolong bleeding. Wear loose fitting clothing that may be easy to take off and that you would not mind if it got stained with Betadine or blood. Do not wear any jewelry or perfume Remove any nail coloring.  It will interfere with some of our monitoring equipment.  NOTE: Remember that this is not meant to be interpreted as a complete list of all possible complications.  Unforeseen problems may occur.  BLOOD THINNERS The following drugs contain aspirin or other products,  which can cause increased bleeding during surgery and should not be taken for 2 weeks prior to and 1 week after surgery.  If you should need take something for relief of minor pain, you may take acetaminophen which is found in Tylenol,m Datril, Anacin-3 and Panadol. It is not blood thinner. The products listed below are.  Do not take any of the products listed below in addition to any listed on your instruction sheet.  A.P.C or A.P.C with Codeine Codeine Phosphate Capsules #3 Ibuprofen Ridaura  ABC compound Congesprin Imuran rimadil  Advil Cope Indocin Robaxisal  Alka-Seltzer Effervescent Pain Reliever and Antacid Coricidin or Coricidin-D  Indomethacin Rufen  Alka-Seltzer plus Cold Medicine Cosprin Ketoprofen S-A-C Tablets  Anacin Analgesic Tablets or Capsules Coumadin   Korlgesic Salflex  Anacin Extra Strength Analgesic tablets or capsules CP-2 Tablets Lanoril Salicylate  Anaprox Cuprimine Capsules Levenox Salocol  Anexsia-D Dalteparin Magan Salsalate  Anodynos Darvon compound Magnesium Salicylate Sine-off  Ansaid Dasin Capsules Magsal Sodium Salicylate  Anturane Depen Capsules Marnal Soma  APF Arthritis pain formula Dewitt's Pills Measurin Stanback  Argesic Dia-Gesic Meclofenamic Sulfinpyrazone  Arthritis Bayer Timed Release Aspirin Diclofenac Meclomen Sulindac  Arthritis pain formula Anacin Dicumarol Medipren Supac  Analgesic (Safety coated) Arthralgen Diffunasal Mefanamic Suprofen  Arthritis Strength Bufferin Dihydrocodeine Mepro Compound Suprol  Arthropan liquid Dopirydamole Methcarbomol with Aspirin Synalgos  ASA tablets/Enseals Disalcid Micrainin Tagament  Ascriptin Doan's Midol Talwin  Ascriptin A/D Dolene Mobidin Tanderil  Ascriptin Extra Strength Dolobid Moblgesic Ticlid  Ascriptin with Codeine Doloprin or Doloprin with Codeine Momentum Tolectin  Asperbuf Duoprin Mono-gesic Trendar  Aspergum Duradyne Motrin or Motrin IB Triminicin  Aspirin plain, buffered or enteric coated  Durasal Myochrisine Trigesic  Aspirin Suppositories Easprin Nalfon Trillsate  Aspirin with Codeine Ecotrin Regular or Extra Strength Naprosyn Uracel  Atromid-S Efficin Naproxen Ursinus  Auranofin Capsules Elmiron Neocylate Vanquish  Axotal Emagrin Norgesic Verin  Azathioprine Empirin or Empirin with Codeine Normiflo Vitamin E  Azolid Emprazil Nuprin Voltaren  Bayer Aspirin plain, buffered or children's or timed BC Tablets or powders Encaprin Orgaran Warfarin Sodium  Buff-a-Comp Enoxaparin Orudis Zorpin  Buff-a-Comp with Codeine Equegesic Os-Cal-Gesic   Buffaprin Excedrin plain, buffered or Extra Strength Oxalid   Bufferin Arthritis Strength Feldene Oxphenbutazone   Bufferin plain or Extra Strength Feldene Capsules Oxycodone with Aspirin   Bufferin with Codeine Fenoprofen Fenoprofen Pabalate or Pabalate-SF   Buffets II Flogesic Panagesic   Buffinol plain or Extra Strength Florinal or Florinal with Codeine Panwarfarin   Buf-Tabs Flurbiprofen Penicillamine   Butalbital Compound Four-way cold tablets Penicillin   Butazolidin Fragmin Pepto-Bismol   Carbenicillin Geminisyn Percodan   Carna Arthritis Reliever Geopen Persantine   Carprofen Gold's salt Persistin   Chloramphenicol Goody's Phenylbutazone   Chloromycetin Haltrain Piroxlcam   Clmetidine heparin Plaquenil   Cllnoril Hyco-pap Ponstel   Clofibrate Hydroxy chloroquine Propoxyphen         Before stopping any of these medications, be sure to consult the physician who ordered them.  Some, such as Coumadin (Warfarin) are ordered to prevent or treat serious conditions such as "deep thrombosis", "pumonary embolisms", and other heart problems.  The amount of time that you may need off of the medication may also vary with the medication and the reason for which you were taking it.  If you are taking any of these medications, please make sure you notify your pain physician before you undergo any procedures.         Moderate Conscious  Sedation, Adult Sedation is the use of medicines to help you relax and not feel pain. Moderate conscious sedation is a type of sedation that makes you less alert than normal. You are still able to respond to instructions, touch, or both. This type of sedation is used during short medical and dental procedures. It is milder than deep sedation, which is a type of sedation you cannot be easily woken up from. It is also milder than general anesthesia, which is the use of medicines to make you fall asleep. Moderate conscious sedation lets you return to your normal activities sooner. Tell a health care provider about: Any allergies you have. All medicines you are taking, including vitamins, herbs, steroids, eye drops, creams, and over-the-counter medicines. Any problems you or family members have had with anesthesia.  Any bleeding problems you have. Any surgeries you have had. Any medical conditions you have. Whether you are pregnant or may be pregnant. Any recent alcohol, tobacco, or drug use. What are the risks? Your health care provider will talk with you about risks. These may include: Oversedation. This is when you get too much medicine. Nausea or vomiting. Allergic reaction to medicines. Trouble breathing. If this happens, a breathing tube may be used. It will be removed when you can breathe better on your own. Heart trouble. Lung trouble. Emergence delirium. This is when you feel confused while the sedation wears off. This gets better with time. What happens before the procedure? When to stop eating and drinking Follow instructions from your health care provider about what you may eat and drink. These may include: 8 hours before your procedure Stop eating most foods. Do not eat meat, fried foods, or fatty foods. Eat only light foods, such as toast or crackers. All liquids are okay except energy drinks and alcohol. 6 hours before your procedure Stop eating. Drink only clear liquids, such  as water, clear fruit juice, black coffee, plain tea, and sports drinks. Do not drink energy drinks or alcohol. 2 hours before your procedure Stop drinking all liquids. You may be allowed to take medicines with small sips of water. If you do not follow your health care provider's instructions, your procedure may be delayed or canceled. Medicines Ask your health care provider about: Changing or stopping your regular medicines. These include any diabetes medicines or blood thinners you take. Taking medicines such as aspirin and ibuprofen. These medicines can thin your blood. Do not take them unless your health care provider tells you to. Taking over-the-counter medicines, vitamins, herbs, and supplements. Tests and exams You may have an exam or testing. You may have a blood or urine sample taken. General instructions Do not use any products that contain nicotine or tobacco for at least 4 weeks before the procedure. These products include cigarettes, chewing tobacco, and vaping devices, such as e-cigarettes. If you need help quitting, ask your health care provider. If you will be going home right after the procedure, plan to have a responsible adult: Take you home from the hospital or clinic. You will not be allowed to drive. Care for you for the time you are told. What happens during the procedure?  You will be given the sedative. It may be given: As a pill you can take by mouth. It can also be put into the rectum. As a spray through the nose. As an injection into muscle. As an injection into a vein through an IV. You may be given oxygen as needed. Your blood pressure, heart rate, breathing rate, and blood oxygen level will be monitored during the procedure. The medical or dental procedure will be done. The procedure may vary among health care providers and hospitals. What happens after the procedure? Your blood pressure, heart rate, breathing rate, and blood oxygen level will be  monitored until you leave the hospital or clinic. You will get fluids through an IV as needed. Do not drive or operate machinery until your health care provider says that it is safe. This information is not intended to replace advice given to you by your health care provider. Make sure you discuss any questions you have with your health care provider. Document Revised: 03/02/2022 Document Reviewed: 03/02/2022 [employer]vier Patient Education  2024 [employer] Epidural Steroid Injection Patient Information  Description: The epidural space surrounds the nerves as  they exit the spinal cord.  In some patients, the nerves can be compressed and inflamed by a bulging disc or a tight spinal canal (spinal stenosis).  By injecting steroids into the epidural space, we can bring irritated nerves into direct contact with a potentially helpful medication.  These steroids act directly on the irritated nerves and can reduce swelling and inflammation which often leads to decreased pain.  Epidural steroids may be injected anywhere along the spine and from the neck to the low back depending upon the location of your pain.   After numbing the skin with local anesthetic (like Novocaine), a small needle is passed into the epidural space slowly.  You may experience a sensation of pressure while this is being done.  The entire block usually last less than 10 minutes.  Conditions which may be treated by epidural steroids:  Low back and leg pain Neck and arm pain Spinal stenosis Post-laminectomy syndrome Herpes zoster (shingles) pain Pain from compression fractures  Preparation for the injection:  Do not eat any solid food or dairy products within 8 hours of your appointment.  You may drink clear liquids up to 3 hours before appointment.  Clear liquids include water, black coffee, juice or soda.  No milk or cream please. You may take your regular medication, including pain medications, with a sip of water before your  appointment  Diabetics should hold regular insulin (if taken separately) and take 1/2 normal NPH dos the morning of the procedure.  Carry some sugar containing items with you to your appointment. A driver must accompany you and be prepared to drive you home after your procedure.  Bring all your current medications with your. An IV may be inserted and sedation may be given at the discretion of the physician.   A blood pressure cuff, EKG and other monitors will often be applied during the procedure.  Some patients may need to have extra oxygen administered for a short period. You will be asked to provide medical information, including your allergies, prior to the procedure.  We must know immediately if you are taking blood thinners (like Coumadin/Warfarin)  Or if you are allergic to IV iodine contrast (dye). We must know if you could possible be pregnant.  Possible side-effects: Bleeding from needle site Infection (rare, may require surgery) Nerve injury (rare) Numbness & tingling (temporary) Difficulty urinating (rare, temporary) Spinal headache ( a headache worse with upright posture) Light -headedness (temporary) Pain at injection site (several days) Decreased blood pressure (temporary) Weakness in arm/leg (temporary) Pressure sensation in back/neck (temporary)  Call if you experience: Fever/chills associated with headache or increased back/neck pain. Headache worsened by an upright position. New onset weakness or numbness of an extremity below the injection site Hives or difficulty breathing (go to the emergency room) Inflammation or drainage at the infection site Severe back/neck pain Any new symptoms which are concerning to you  Please note:  Although the local anesthetic injected can often make your back or neck feel good for several hours after the injection, the pain will likely return.  It takes 3-7 days for steroids to work in the epidural space.  You may not notice any pain  relief for at least that one week.  If effective, we will often do a series of three injections spaced 3-6 weeks apart to maximally decrease your pain.  After the initial series, we generally will wait several months before considering a repeat injection of the same type.  If you have any  questions, please call [phone] [facility]
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
## 2023-09-20 NOTE — Progress Notes (Signed)
PROVIDER NOTE: Information contained herein reflects review and annotations entered in association with encounter. Interpretation of such information and data should be left to medically-trained personnel. Information provided to patient can be located elsewhere in the medical record under "Patient Instructions". Document created using STT-dictation technology, any transcriptional errors that may result from process are unintentional.    Patient: Elizabeth Good  Service Category: E/M  Provider: Edward Jolly, MD  DOB: September 04, 1965  DOS: 09/20/2023  Referring Provider: Leim Fabry, MD  MRN: [id number]  Specialty: Interventional Pain Management  PCP: Leim Fabry, MD  Type: Established Patient  Setting: Ambulatory outpatient    Location: Office  Delivery: Face-to-face     HPI  Ms. Elizabeth Good, a 58 y.o. year old female, is here today because of her Cervical radicular pain [M54.12]. Ms. Macedo primary complain today is Neck Pain  Pain Assessment: Severity of Chronic pain is reported as a 2 /10. Location: Neck Right/to right shoulder down right arm to middle 3 fingers. Onset: More than a month ago. Quality: Aching, Burning, Tingling. Timing: Constant. Modifying factor(s): heat, raising right arm above head, esi. Vitals:  height is 5\' 4"  (1.626 m) and weight is 161 lb (73 kg). Her temperature is 97.7 F (36.5 C). Her blood pressure is 153/91 (abnormal) and her pulse is 80. Her respiration is 16 and oxygen saturation is 98%.  BMI: Estimated body mass index is 27.64 kg/m as calculated from the following:   Height as of this encounter: 5\' 4"  (1.626 m).   Weight as of this encounter: 161 lb (73 kg). Last encounter: 08/23/2023. Last procedure: Visit date not found.  Reason for encounter: post-procedure evaluation and assessment.   Post-procedure evaluation    Procedure: Cervical Epidural Steroid injection (CESI) (Interlaminar) #1  Laterality: Right  Level: C7-T1 Imaging:  Fluoroscopy-assisted DOS: 08/23/2023  Performed by: Edward Jolly, MD Anesthesia: Local anesthesia (1-2% Lidocaine) Sedation: Minimal Sedation                         Purpose: Diagnostic/Therapeutic Indications: Cervicalgia, cervical radicular pain, degenerative disc disease, severe enough to impact quality of life or function. 1. Cervical radicular pain (right)   2. Foraminal stenosis of cervical region   3. Chronic pain syndrome    NAS-11 score:   Pre-procedure: 2 /10   Post-procedure: 0-No pain/10     Effectiveness:  Initial hour after procedure: 30 %  Subsequent 4-6 hours post-procedure: 30 % (headache the first day; pt reports "another really bad headache on day 14)  Analgesia past initial 6 hours: 75 %  Ongoing improvement:  Analgesic:  70% Function: Somewhat improved ROM: Somewhat improved   ROS  Constitutional: Denies any fever or chills Gastrointestinal: No reported hemesis, hematochezia, vomiting, or acute GI distress Musculoskeletal: Denies any acute onset joint swelling, redness, loss of ROM, or weakness Neurological:  cervicalgia and cervical radicular pain  Medication Review  B-12, LamoTRIgine, Semaglutide-Weight Management, Vitamin D3, celecoxib, cetirizine, cyanocobalamin, estradiol, gabapentin, meloxicam, multivitamin, pantoprazole, terbinafine, tiZANidine, and traMADol  History Review  Allergy: Ms. Minnis is allergic to dermabase [albolene], other, latex, penicillins, and sulfa antibiotics. Drug: Ms. Chronis  reports no history of drug use. Alcohol:  reports current alcohol use. Tobacco:  reports that she has never smoked. She has never used smokeless tobacco. Social: Ms. Ranta  reports that she has never smoked. She has never used smokeless tobacco. She reports current alcohol use. She reports that she does not use drugs. Medical:  has  a past medical history of Allergy, Anemia, Bartholin cyst (1990), Basal cell carcinoma, Curvature of spine, Family history of  adverse reaction to anesthesia, GERD (gastroesophageal reflux disease), Gestational diabetes (08/11/2022), High cholesterol, History of bladder infections, History of hiatal hernia, History of kidney infection, History of kidney stones, Hypertension, Kidney stones, Low serum iron, Meningitis, PONV (postoperative nausea and vomiting), PVC's (premature ventricular contractions), Renal disorder, Scoliosis, Seizures (HCC), and Wears contact lenses. Surgical: Ms. Ruckel  has a past surgical history that includes Tonsillectomy; Cholecystectomy (2011); Gastric bypass (2011); Bladder surgery; Rectal surgery; Foot surgery (Left); Septoplasty; Incontinence surgery; Anterior and posterior vaginal repair; Endometrial ablation (2011); Ganglion cyst excision; Combined hysteroscopy diagnostic / D&C (12/06/2014); Extracorporeal shock wave lithotripsy (Right, 08/25/2018); Breast biopsy (Left, 06/29/2019); Breast biopsy (Left, 07/14/2019); Laparoscopic bilateral salpingectomy (08/08/2019); biopsy (N/A, 08/08/2019); Oophorectomy (Left, 2020); Lithotripsy (2016); Colonoscopy with propofol (N/A, 02/07/2021); polypectomy (N/A, 02/07/2021); Breast biopsy (Left, 08/11/2019); and Breast excisional biopsy (Left, 09/21/2019). Family: family history includes Bladder Cancer in her mother; Breast cancer in her cousin; Melanoma in her father; Prostate cancer in her father.  Laboratory Chemistry Profile   Renal Lab Results  Component Value Date   BUN 14 07/07/2023   CREATININE 0.86 07/07/2023   BCR 16 07/07/2023   GFRAA >60 04/29/2017   GFRNONAA >60 04/29/2017    Hepatic Lab Results  Component Value Date   AST 25 07/07/2023   ALT 17 07/07/2023   ALBUMIN 4.4 07/07/2023   ALKPHOS 124 (H) 07/07/2023    Electrolytes Lab Results  Component Value Date   NA 141 07/07/2023   K 4.0 07/07/2023   CL 100 07/07/2023   CALCIUM 9.9 07/07/2023    Bone No results found for: "VD25OH", "VD125OH2TOT", "WU9811BJ4", "NW2956OZ3", "25OHVITD1",  "25OHVITD2", "25OHVITD3", "TESTOFREE", "TESTOSTERONE"  Inflammation (CRP: Acute Phase) (ESR: Chronic Phase) No results found for: "CRP", "ESRSEDRATE", "LATICACIDVEN"       Note: Above Lab results reviewed.  IMPRESSION: 1. Small right paracentral disc protrusion at C5-6 without significant stenosis or impingement. 2. Small central disc protrusion with uncovertebral spurring at C3-4 with resultant mild left C4 foraminal stenosis. 3. Right paracentral disc extrusion with superior migration at T1-2 without significant stenosis. 4. Additional mild noncompressive disc bulging elsewhere within the cervical spine as above. No other significant stenosis or neural impingement. Physical Exam  General appearance: Well nourished, well developed, and well hydrated. In no apparent acute distress Mental status: Alert, oriented x 3 (person, place, & time)       Respiratory: No evidence of acute respiratory distress Eyes: PERLA Vitals: BP (!) 153/91   Pulse 80   Temp 97.7 F (36.5 C)   Resp 16   Ht 5\' 4"  (1.626 m)   Wt 161 lb (73 kg)   LMP 11/29/2017 (Approximate)   SpO2 98%   BMI 27.64 kg/m  BMI: Estimated body mass index is 27.64 kg/m as calculated from the following:   Height as of this encounter: 5\' 4"  (1.626 m).   Weight as of this encounter: 161 lb (73 kg). Ideal: Ideal body weight: 54.7 kg (120 lb 9.5 oz) Adjusted ideal body weight: 62 kg (136 lb 12.1 oz)  Right cervical radicular pain  Assessment   Diagnosis Status  1. Cervical radicular pain (right)   2. Foraminal stenosis of cervical region   3. Chronic pain syndrome    Responding Responding Controlled    Plan of Care  Repeat C-ESI #2   Orders:  Orders Placed This Encounter  Procedures   Cervical Epidural Injection  Sedation: Patient's choice. Purpose: Diagnostic/Therapeutic Indication(s): Radiculitis and cervicalgia associater with cervical degenerative disc disease.    Standing Status:   Future    Expected  Date:   10/04/2023    Expiration Date:   12/19/2023    Scheduling Instructions:     Procedure: Cervical Epidural Steroid Injection/Block     Level(s): C7-T1     Laterality: TBD     Timeframe: As soon as schedule allows    Where will this procedure be performed?:   [facility]             Pricella Gaugh   Follow-up plan:   Return in about 17 days (around 10/07/2023) for Right C-ESI #2, in clinic IV Versed.      Right C-ESI 08/23/23     Recent Visits Date Type Provider Dept  08/23/23 Office Visit Edward Jolly, MD [facility]  Showing recent visits within past 90 days and meeting all other requirements Today's Visits Date Type Provider Dept  09/20/23 Office Visit Edward Jolly, MD [facility]  Showing today's visits and meeting all other requirements Future Appointments Date Type Provider Dept  10/07/23 Appointment Edward Jolly, MD [facility]  Showing future appointments within next 90 days and meeting all other requirements  I discussed the assessment and treatment plan with the patient. The patient was provided an opportunity to ask questions and all were answered. The patient agreed with the plan and demonstrated an understanding of the instructions.  Patient advised to call back or seek an in-person evaluation if the symptoms or condition worsens.  Duration of encounter: 15 minutes.  Total time on encounter, as per AMA guidelines included both the face-to-face and non-face-to-face time personally spent by the physician and/or other qualified health care professional(s) on the day of the encounter (includes time in activities that require the physician or other qualified health care professional and does not include time in activities normally performed by clinical staff). Physician's time may include the following activities when performed: Preparing to see the patient (e.g., pre-charting review of records, searching for previously ordered imaging,  lab work, and nerve conduction tests) Review of prior analgesic pharmacotherapies. Reviewing PMP Interpreting ordered tests (e.g., lab work, imaging, nerve conduction tests) Performing post-procedure evaluations, including interpretation of diagnostic procedures Obtaining and/or reviewing separately obtained history Performing a medically appropriate examination and/or evaluation Counseling and educating the patient/family/caregiver Ordering medications, tests, or procedures Referring and communicating with other health care professionals (when not separately reported) Documenting clinical information in the electronic or other health record Independently interpreting results (not separately reported) and communicating results to the patient/ family/caregiver Care coordination (not separately reported)  Note by: Edward Jolly, MD Date: 09/20/2023; Time: 4:14 PM

## 2023-09-20 NOTE — Progress Notes (Signed)
Safety precautions to be maintained throughout the outpatient stay will include: orient to surroundings, keep bed in low position, maintain call bell within reach at all times, provide assistance with transfer out of bed and ambulation.  

## 2023-09-22 ENCOUNTER — Encounter: Payer: Commercial Managed Care - PPO | Admitting: Physical Therapy

## 2023-09-27 ENCOUNTER — Ambulatory Visit: Payer: Commercial Managed Care - PPO

## 2023-09-27 DIAGNOSIS — M546 Pain in thoracic spine: Secondary | ICD-10-CM | POA: Diagnosis not present

## 2023-09-27 DIAGNOSIS — M542 Cervicalgia: Secondary | ICD-10-CM
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
## 2023-10-04 ENCOUNTER — Ambulatory Visit: Payer: Commercial Managed Care - PPO | Attending: Orthopedic Surgery

## 2023-10-04 DIAGNOSIS — M542 Cervicalgia: Secondary | ICD-10-CM | POA: Insufficient documentation

## 2023-10-04 DIAGNOSIS — M546 Pain in thoracic spine: Secondary | ICD-10-CM | POA: Insufficient documentation
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
## 2023-10-07 ENCOUNTER — Encounter: Payer: Self-pay | Admitting: Student in an Organized Health Care Education/Training Program

## 2023-10-07 ENCOUNTER — Ambulatory Visit
Payer: Commercial Managed Care - PPO | Attending: Student in an Organized Health Care Education/Training Program | Admitting: Student in an Organized Health Care Education/Training Program

## 2023-10-07 ENCOUNTER — Ambulatory Visit
Admission: RE | Admit: 2023-10-07 | Discharge: 2023-10-07 | Disposition: A | Discharge status: Home / Self Care | Payer: Commercial Managed Care - PPO | Source: Ambulatory Visit | Attending: Student in an Organized Health Care Education/Training Program | Admitting: Student in an Organized Health Care Education/Training Program

## 2023-10-07 VITALS — BP 157/94 | HR 76 | Temp 97.9°F | Resp 14 | Ht 64.0 in | Wt 162.0 lb

## 2023-10-07 DIAGNOSIS — M5412 Radiculopathy, cervical region: Secondary | ICD-10-CM | POA: Diagnosis not present

## 2023-10-07 DIAGNOSIS — M4802 Spinal stenosis, cervical region: Secondary | ICD-10-CM | POA: Insufficient documentation

## 2023-10-07 MED ORDER — IOHEXOL 180 MG/ML  SOLN
10.0000 mL | Freq: Once | INTRAMUSCULAR | Status: AC
  Administered 2023-10-07: 10 mL via EPIDURAL

## 2023-10-07 MED ORDER — ROPIVACAINE HCL 2 MG/ML IJ SOLN
INTRAMUSCULAR | Status: AC
  Filled 2023-10-07: qty 20

## 2023-10-07 MED ORDER — MIDAZOLAM HCL 2 MG/2ML IJ SOLN
INTRAMUSCULAR | Status: AC
  Filled 2023-10-07: qty 2

## 2023-10-07 MED ORDER — ROPIVACAINE HCL 2 MG/ML IJ SOLN
1.0000 mL | Freq: Once | INTRAMUSCULAR | Status: AC
  Administered 2023-10-07: 1 mL via EPIDURAL

## 2023-10-07 MED ORDER — MIDAZOLAM HCL 2 MG/2ML IJ SOLN
0.5000 mg | Freq: Once | INTRAMUSCULAR | Status: AC
  Administered 2023-10-07: 2 mg via INTRAVENOUS

## 2023-10-07 MED ORDER — DEXAMETHASONE SODIUM PHOSPHATE 10 MG/ML IJ SOLN
INTRAMUSCULAR | Status: AC
  Filled 2023-10-07: qty 1

## 2023-10-07 MED ORDER — TRAMADOL HCL 50 MG PO TABS: 50.0000 mg | ORAL_TABLET | Freq: Every evening | ORAL | 1 refills | Status: DC | PRN

## 2023-10-07 MED ORDER — IOHEXOL 180 MG/ML  SOLN
INTRAMUSCULAR | Status: AC
  Filled 2023-10-07: qty 20

## 2023-10-07 MED ORDER — LIDOCAINE HCL 2 % IJ SOLN
INTRAMUSCULAR | Status: AC
  Filled 2023-10-07: qty 20

## 2023-10-07 MED ORDER — SODIUM CHLORIDE 0.9% FLUSH
1.0000 mL | Freq: Once | INTRAVENOUS | Status: AC
  Administered 2023-10-07: 1 mL

## 2023-10-07 MED ORDER — DEXAMETHASONE SODIUM PHOSPHATE 10 MG/ML IJ SOLN
10.0000 mg | Freq: Once | INTRAMUSCULAR | Status: AC
Start: 2023-10-07 — End: 2023-10-07
  Administered 2023-10-07: 10 mg

## 2023-10-07 MED ORDER — LACTATED RINGERS IV SOLN: Freq: Once | INTRAVENOUS | Status: AC

## 2023-10-07 MED ORDER — SODIUM CHLORIDE (PF) 0.9 % IJ SOLN
INTRAMUSCULAR | Status: AC
  Filled 2023-10-07: qty 10

## 2023-10-07 MED ORDER — LIDOCAINE HCL 2 % IJ SOLN
20.0000 mL | Freq: Once | INTRAMUSCULAR | Status: AC
  Administered 2023-10-07: 400 mg

## 2023-10-07 NOTE — Patient Instructions (Addendum)
 ______________________________________________________________________    Post-Procedure Discharge Instructions  Instructions: Apply ice:  Purpose: This will minimize any swelling and discomfort after procedure.  When: Day of procedure, as soon as you get home. How: Fill a plastic sandwich bag with crushed ice. Cover it with a small towel and apply to injection site. How long: (15 min on, 15 min off) Apply for 15 minutes then remove x 15 minutes.  Repeat sequence on day of procedure, until you go to bed. Apply heat:  Purpose: To treat any soreness and discomfort from the procedure. When: Starting the next day after the procedure. How: Apply heat to procedure site starting the day following the procedure. How long: May continue to repeat daily, until discomfort goes away. Food intake: Start with clear liquids (like water) and advance to regular food, as tolerated.  Physical activities: Keep activities to a minimum for the first 8 hours after the procedure. After that, then as tolerated. Driving: If you have received any sedation, be responsible and do not drive. You are not allowed to drive for 24 hours after having sedation. Blood thinner: (Applies only to those taking blood thinners) You may restart your blood thinner 6 hours after your procedure. Insulin: (Applies only to Diabetic patients taking insulin) As soon as you can eat, you may resume your normal dosing schedule. Infection prevention: Keep procedure site clean and dry. Shower daily and clean area with soap and water. Post-procedure Pain Diary: Extremely important that this be done correctly and accurately. Recorded information will be used to determine the next step in treatment. For the purpose of accuracy, follow these rules: Evaluate only the area treated. Do not report or include pain from an untreated area. For the purpose of this evaluation, ignore all other areas of pain, except for the treated area. After your procedure,  avoid taking a long nap and attempting to complete the pain diary after you wake up. Instead, set your alarm clock to go off every hour, on the hour, for the initial 8 hours after the procedure. Document the duration of the numbing medicine, and the relief you are getting from it. Do not go to sleep and attempt to complete it later. It will not be accurate. If you received sedation, it is likely that you were given a medication that may cause amnesia. Because of this, completing the diary at a later time may cause the information to be inaccurate. This information is needed to plan your care. Follow-up appointment: Keep your post-procedure follow-up evaluation appointment after the procedure (usually 2 weeks for most procedures, 6 weeks for radiofrequencies). DO NOT FORGET to bring you pain diary with you.   Expect: (What should I expect to see with my procedure?) From numbing medicine (AKA: Local Anesthetics): Numbness or decrease in pain. You may also experience some weakness, which if present, could last for the duration of the local anesthetic. Onset: Full effect within 15 minutes of injected. Duration: It will depend on the type of local anesthetic used. On the average, 1 to 8 hours.  From steroids (Applies only if steroids were used): Decrease in swelling or inflammation. Once inflammation is improved, relief of the pain will follow. Onset of benefits: Depends on the amount of swelling present. The more swelling, the longer it will take for the benefits to be seen. In some cases, up to 10 days. Duration: Steroids will stay in the system x 2 weeks. Duration of benefits will depend on multiple posibilities including persistent irritating factors. Side-effects: If  present, they may typically last 2 weeks (the duration of the steroids). Frequent: Cramps (if they occur, drink Gatorade and take over-the-counter Magnesium 450-500 mg once to twice a day); water retention with temporary weight gain;  increases in blood sugar; decreased immune system response; increased appetite. Occasional: Facial flushing (red, warm cheeks); mood swings; menstrual changes. Uncommon: Long-term decrease or suppression of natural hormones; bone thinning. (These are more common with higher doses or more frequent use. This is why we prefer that our patients avoid having any injection therapies in other practices.)  Very Rare: Severe mood changes; psychosis; aseptic necrosis. From procedure: Some discomfort is to be expected once the numbing medicine wears off. This should be minimal if ice and heat are applied as instructed.  Call if: (When should I call?) You experience numbness and weakness that gets worse with time, as opposed to wearing off. New onset bowel or bladder incontinence. (Applies only to procedures done in the spine)  Emergency Numbers: Durning business hours (Monday - Thursday, 8:00 AM - 4:00 PM) (Friday, 9:00 AM - 12:00 Noon): [phone] After hours: [phone] NOTE: If you are having a problem and are unable connect with, or to talk to a provider, then go to your nearest urgent care or emergency department. If the problem is serious and urgent, please call 911. ______________________________________________________________________      ______________________________________________________________________    Preparing for your procedure  Appointments: If you think you may not be able to keep your appointment, call 24-48 hours in advance to cancel. We need time to make it available to others.  Procedure visits are for procedures only. During your procedure appointment there will be: NO Prescription Refills*. NO medication changes or discussions*. NO discussion of disability issues*. NO unrelated pain problem evaluations*. NO evaluations to order other pain procedures*. *These will be addressed at a separate and distinct evaluation encounter on the provider's evaluation schedule  and not during procedure days.  Instructions: Food intake: Avoid eating anything solid for at least 8 hours prior to your procedure. Clear liquid intake: You may take clear liquids such as water up to 2 hours prior to your procedure. (No carbonated drinks. No soda.) Transportation: Unless otherwise stated by your physician, bring a driver. (Driver cannot be a Market researcher, Pharmacist, community, or any other form of public transportation.) Morning Medicines: Except for blood thinners, take all of your other morning medications with a sip of water. Make sure to take your heart and blood pressure medicines. If your blood pressure's lower number is above 100, the case will be rescheduled. Blood thinners: Make sure to stop your blood thinners as instructed.  If you take a blood thinner, but were not instructed to stop it, call our office [phone] and ask to talk to a nurse. Not stopping a blood thinner prior to certain procedures could lead to serious complications. Diabetics on insulin: Notify the staff so that you can be scheduled 1st case in the morning. If your diabetes requires high dose insulin, take only  of your normal insulin dose the morning of the procedure and notify the staff that you have done so. Preventing infections: Shower with an antibacterial soap the morning of your procedure.  Build-up your immune system: Take 1000 mg of Vitamin C with every meal (3 times a day) the day prior to your procedure. Antibiotics: Inform the nursing staff if you are taking any antibiotics or if you have any conditions that may require antibiotics prior to procedures. (Example: recent joint  implants)   Pregnancy: If you are pregnant make sure to notify the nursing staff. Not doing so may result in injury to the fetus, including death.  Sickness: If you have a cold, fever, or any active infections, call and cancel or reschedule your procedure. Receiving steroids while having an infection may result in complications. Arrival:  You must be in the facility at least 30 minutes prior to your scheduled procedure. Tardiness: Your scheduled time is also the cutoff time. If you do not arrive at least 15 minutes prior to your procedure, you will be rescheduled.  Children: Do not bring any children with you. Make arrangements to keep them home. Dress appropriately: There is always a possibility that your clothing may get soiled. Avoid long dresses. Valuables: Do not bring any jewelry or valuables.  Reasons to call and reschedule or cancel your procedure: (Following these recommendations will minimize the risk of a serious complication.) Surgeries: Avoid having procedures within 2 weeks of any surgery. (Avoid for 2 weeks before or after any surgery). Flu Shots: Avoid having procedures within 2 weeks of a flu shots or . (Avoid for 2 weeks before or after immunizations). Barium: Avoid having a procedure within 7-10 days after having had a radiological study involving the use of radiological contrast. (Myelograms, Barium swallow or enema study). Heart attacks: Avoid any elective procedures or surgeries for the initial 6 months after a "Myocardial Infarction" (Heart Attack). Blood thinners: It is imperative that you stop these medications before procedures. Let us know if you if you take any blood thinner.  Infection: Avoid procedures during or within two weeks of an infection (including chest colds or gastrointestinal problems). Symptoms associated with infections include: Localized redness, fever, chills, night sweats or profuse sweating, burning sensation when voiding, cough, congestion, stuffiness, runny nose, sore throat, diarrhea, nausea, vomiting, cold or Flu symptoms, recent or current infections. It is specially important if the infection is over the area that we intend to treat. Heart and lung problems: Symptoms that may suggest an active cardiopulmonary problem include: cough, chest pain, breathing difficulties or shortness of  breath, dizziness, ankle swelling, uncontrolled high or unusually low blood pressure, and/or palpitations. If you are experiencing any of these symptoms, cancel your procedure and contact your primary care physician for an evaluation.  Remember:  Regular Business hours are:  Monday to Thursday 8:00 AM to 4:00 PM  Provider's Schedule: Delano Metz, MD:  Procedure days: Tuesday and Thursday 7:30 AM to 4:00 PM  Edward Jolly, MD:  Procedure days: Monday and Wednesday 7:30 AM to 4:00 PM Last  Updated: 08/10/2023 ______________________________________________________________________

## 2023-10-07 NOTE — Progress Notes (Signed)
 PROVIDER NOTE: Interpretation of information contained herein should be left to medically-trained personnel. Specific patient instructions are provided elsewhere under Patient Instructions section of medical record. This document was created in part using STT-dictation technology, any transcriptional errors that may result from this process are unintentional.  Patient: Elizabeth Good Type: Established DOB: 1966/08/01 MRN: [id number] PCP: Jyl Railing, MD  Service: Procedure DOS: 10/07/2023 Setting: Ambulatory Location: Ambulatory outpatient facility Delivery: Face-to-face Provider: Wallie Sherry, MD Specialty: Interventional Pain Management Specialty designation: 09 Location: Outpatient facility Ref. Prov.: Jyl Railing, MD       Interventional Therapy   Procedure: Cervical Epidural Steroid injection (CESI) (Interlaminar) #2  Laterality: Right  Level: C7-T1 Imaging: Fluoroscopy-assisted DOS: 10/07/2023  Performed by: Wallie Sherry, MD Anesthesia: Local anesthesia (1-2% Lidocaine ) Sedation: Minimal Sedation                         Purpose: Diagnostic/Therapeutic Indications: Cervicalgia, cervical radicular pain, degenerative disc disease, severe enough to impact quality of life or function. 1. Cervical radicular pain (right)   2. Foraminal stenosis of cervical region    NAS-11 score:   Pre-procedure: 5 /10   Post-procedure: 5 /10      Position  Prep  Materials:  Location setting: Procedure suite Position: Prone, on modified reverse trendelenburg to facilitate breathing, with head in head-cradle. Pillows positioned under chest (below chin-level) with cervical spine flexed. Safety Precautions: Patient was assessed for positional comfort and pressure points before starting the procedure. Prepping solution: DuraPrep (Iodine Povacrylex [0.7% available iodine] and Isopropyl Alcohol, 74% w/w) Prep Area: Entire  cervicothoracic region Approach: percutaneous,  paramedial Intended target: Posterior cervical epidural space Materials Procedure:  Tray: Epidural Needle(s): Epidural (Tuohy) Qty: 1 Length: (90mm) 3.5-inch Gauge: 22G   H&P (Pre-op Assessment):  Elizabeth Good is a 58 y.o. (year old), female patient, seen today for interventional treatment. She  has a past surgical history that includes Tonsillectomy; Cholecystectomy (2011); Gastric bypass (2011); Bladder surgery; Rectal surgery; Foot surgery (Left); Septoplasty; Incontinence surgery; Anterior and posterior vaginal repair; Endometrial ablation (2011); Ganglion cyst excision; Combined hysteroscopy diagnostic / D&C (12/06/2014); Extracorporeal shock wave lithotripsy (Right, 08/25/2018); Breast biopsy (Left, 06/29/2019); Breast biopsy (Left, 07/14/2019); Laparoscopic bilateral salpingectomy (08/08/2019); biopsy (N/A, 08/08/2019); Oophorectomy (Left, 2020); Lithotripsy (2016); Colonoscopy with propofol  (N/A, 02/07/2021); polypectomy (N/A, 02/07/2021); Breast biopsy (Left, 08/11/2019); and Breast excisional biopsy (Left, 09/21/2019). Elizabeth Good has a current medication list which includes the following prescription(s): cetirizine, vitamin d3, cyanocobalamin , gabapentin , lamotrigine , multivitamin, pantoprazole , tramadol , celecoxib , b-12, estradiol , meloxicam , pantoprazole , wegovy , terbinafine , and tizanidine , and the following Facility-Administered Medications: lactated ringers . Her primarily concern today is the Neck Pain  Initial Vital Signs:  Pulse/HCG Rate: 76ECG Heart Rate: 80 Temp: 97.9 F (36.6 C) Resp: 14 BP: (!) 149/90 SpO2: 100 %  BMI: Estimated body mass index is 27.81 kg/m as calculated from the following:   Height as of this encounter: 5' 4 (1.626 m).   Weight as of this encounter: 162 lb (73.5 kg).  Risk Assessment: Allergies: Reviewed. She is allergic to dermabase [albolene], other, latex, penicillins, and sulfa antibiotics.  Allergy Precautions: None required Coagulopathies:  Reviewed. None identified.  Blood-thinner therapy: None at this time Active Infection(s): Reviewed. None identified. [name] is afebrile  Site Confirmation: Elizabeth Good was asked to confirm the procedure and laterality before marking the site Procedure checklist: Completed Consent: Before the procedure and under the influence of no sedative(s), amnesic(s), or anxiolytics, the patient was informed of the treatment options,  risks and possible complications. To fulfill our ethical and legal obligations, as recommended by the American Medical Association's Code of Ethics, I have informed the patient of my clinical impression; the nature and purpose of the treatment or procedure; the risks, benefits, and possible complications of the intervention; the alternatives, including doing nothing; the risk(s) and benefit(s) of the alternative treatment(s) or procedure(s); and the risk(s) and benefit(s) of doing nothing. The patient was provided information about the general risks and possible complications associated with the procedure. These may include, but are not limited to: failure to achieve desired goals, infection, bleeding, organ or nerve damage, allergic reactions, paralysis, and death. In addition, the patient was informed of those risks and complications associated to Spine-related procedures, such as failure to decrease pain; infection (i.e.: Meningitis, epidural or intraspinal abscess); bleeding (i.e.: epidural hematoma, subarachnoid hemorrhage, or any other type of intraspinal or peri-dural bleeding); organ or nerve damage (i.e.: Any type of peripheral nerve, nerve root, or spinal cord injury) with subsequent damage to sensory, motor, and/or autonomic systems, resulting in permanent pain, numbness, and/or weakness of one or several areas of the body; allergic reactions; (i.e.: anaphylactic reaction); and/or death. Furthermore, the patient was informed of those risks and complications associated with the  medications. These include, but are not limited to: allergic reactions (i.e.: anaphylactic or anaphylactoid reaction(s)); adrenal axis suppression; blood sugar elevation that in diabetics may result in ketoacidosis or comma; water  retention that in patients with history of congestive heart failure may result in shortness of breath, pulmonary edema, and decompensation with resultant heart failure; weight gain; swelling or edema; medication-induced neural toxicity; particulate matter embolism and blood vessel occlusion with resultant organ, and/or nervous system infarction; and/or aseptic necrosis of one or more joints. Finally, the patient was informed that Medicine is not an exact science; therefore, there is also the possibility of unforeseen or unpredictable risks and/or possible complications that may result in a catastrophic outcome. The patient indicated having understood very clearly. We have given the patient no guarantees and we have made no promises. Enough time was given to the patient to ask questions, all of which were answered to the patient's satisfaction. Ms. Hibberd has indicated that she wanted to continue with the procedure. Attestation: I, the ordering provider, attest that I have discussed with the patient the benefits, risks, side-effects, alternatives, likelihood of achieving goals, and potential problems during recovery for the procedure that I have provided informed consent. Date  Time: 10/07/2023  2:20 PM   Pre-Procedure Preparation:  Monitoring: As per clinic protocol. Respiration, ETCO2, SpO2, BP, heart rate and rhythm monitor placed and checked for adequate function Safety Precautions: Patient was assessed for positional comfort and pressure points before starting the procedure. Time-out: I initiated and conducted the Time-out before starting the procedure, as per protocol. The patient was asked to participate by confirming the accuracy of the Time Out information. Verification  of the correct person, site, and procedure were performed and confirmed by me, the nursing staff, and the patient. Time-out conducted as per Joint Commission's Universal Protocol (UP.01.01.01). Time: 1514 Start Time: 1515 hrs.  Description  Narrative of Procedure:          Rationale (medical necessity): procedure needed and proper for the diagnosis and/or treatment of the patient's medical symptoms and needs. Start Time: 1515 hrs. Safety Precautions: Aspiration looking for blood return was conducted prior to all injections. At no point did we inject any substances, as a needle was being advanced. No attempts were  made at seeking any paresthesias. Safe injection practices and needle disposal techniques used. Medications properly checked for expiration dates. SDV (single dose vial) medications used. Description of procedure: Protocol guidelines were followed. The patient was assisted into a comfortable position. The target area was identified and the area prepped in the usual manner. Skin & deeper tissues infiltrated with local anesthetic. Appropriate amount of time allowed to pass for local anesthetics to take effect. Using fluoroscopic guidance, the epidural needle was introduced through the skin, ipsilateral to the reported pain, and advanced to the target area. Posterior laminar os was contacted and the needle walked caudad, until the lamina was cleared. The ligamentum flavum was engaged and the epidural space identified using "loss-of-resistance technique" with 2-3 ml of PF-NaCl (0.9% NSS), in a 5cc dedicated LOR syringe. (See Imaging guidance below for use of contrast details.) Once proper needle placement was secured, and negative aspiration confirmed, the solution was injected in intermittent fashion, asking for systemic symptoms every 0.5cc. The needles were then removed and the area cleansed, making sure to leave some of the prepping solution back to take advantage of its long term bactericidal  properties.  3.5 cc solution made of 1.5 cc of preservative-free saline, 1 cc of 0.2% ropivacaine , 1 cc of Decadron  10 mg/cc.   Vitals:   10/07/23 1450 10/07/23 1510 10/07/23 1515 10/07/23 1521  BP: (!) 149/90 (!) 142/92 (!) 142/90 (!) 144/90  Pulse: 76     Resp: 14 15 14 14   Temp: 97.9 F (36.6 C)     TempSrc: Temporal     SpO2: 100% 100% 100% 100%  Weight: 162 lb (73.5 kg)     Height: 5' 4 (1.626 m)        End Time: 1520 hrs.  Imaging Guidance (Spinal):          Type of Imaging Technique: Fluoroscopy Guidance (Spinal) Indication(s): Fluoroscopy guidance for needle placement to enhance accuracy in procedures requiring precise needle localization for targeted delivery of medication in or near specific anatomical locations not easily accessible without such real-time imaging assistance. Exposure Time: Please see nurses notes. Contrast: Before injecting any contrast, we confirmed that the patient did not have an allergy to iodine, shellfish, or radiological contrast. Once satisfactory needle placement was completed at the desired level, radiological contrast was injected. Contrast injected under live fluoroscopy. No contrast complications. See chart for type and volume of contrast used. Fluoroscopic Guidance: I was personally present during the use of fluoroscopy. Tunnel Vision Technique used to obtain the best possible view of the target area. Parallax error corrected before commencing the procedure. Direction-depth-direction technique used to introduce the needle under continuous pulsed fluoroscopy. Once target was reached, antero-posterior, oblique, and lateral fluoroscopic projection used confirm needle placement in all planes. Images permanently stored in EMR. Interpretation: I personally interpreted the imaging intraoperatively. Adequate needle placement confirmed in multiple planes. Appropriate spread of contrast into desired area was observed. No evidence of afferent or efferent  intravascular uptake. No intrathecal or subarachnoid spread observed. Permanent images saved into the patient's record.  Post-operative Assessment:  Post-procedure Vital Signs:  Pulse/HCG Rate: 7680 Temp: 97.9 F (36.6 C) Resp: 14 BP: (!) 144/90 SpO2: 100 %  EBL: None  Complications: No immediate post-treatment complications observed by team, or reported by patient.  Note: The patient tolerated the entire procedure well. A repeat set of vitals were taken after the procedure and the patient was kept under observation following institutional policy, for this type of procedure. Post-procedural neurological  assessment was performed, showing return to baseline, prior to discharge. The patient was provided with post-procedure discharge instructions, including a section on how to identify potential problems. Should any problems arise concerning this procedure, the patient was given instructions to immediately contact us , at any time, without hesitation. In any case, we plan to contact the patient by telephone for a follow-up status report regarding this interventional procedure.  Comments:  No additional relevant information.  Plan of Care (POC)  Orders:  Orders Placed This Encounter  Procedures   DG PAIN CLINIC C-ARM 1-60 MIN NO REPORT    Intraoperative interpretation by procedural physician at [facility].    Standing Status:   Standing    Number of Occurrences:   1    Reason for exam::   Assistance in needle guidance and placement for procedures requiring needle placement in or near specific anatomical locations not easily accessible without such assistance.   Compliance Drug Analysis, Ur    Volume: 30 ml(s). Minimum 3 ml of urine is needed. Document temperature of fresh sample. Indications: Long term (current) use of opiate analgesic (Z79.891) Test#: [id number] (Comprehensive Profile)    Release to patient:   Immediate     Medications ordered for procedure: Meds ordered this  encounter  Medications   iohexol  (OMNIPAQUE ) 180 MG/ML injection 10 mL    Must be Myelogram-compatible. If not available, you may substitute with a water -soluble, non-ionic, hypoallergenic, myelogram-compatible radiological contrast medium.   lidocaine  (XYLOCAINE ) 2 % (with pres) injection 400 mg   ropivacaine  (PF) 2 mg/mL (0.2%) (NAROPIN ) injection 1 mL   sodium chloride  flush (NS) 0.9 % injection 1 mL   dexamethasone  (DECADRON ) injection 10 mg   lactated ringers  infusion   midazolam  (VERSED ) injection 0.5-2 mg    Make sure Flumazenil is available in the pyxis when using this medication. If oversedation occurs, administer 0.2 mg IV over 15 sec. If after 45 sec no response, administer 0.2 mg again over 1 min; may repeat at 1 min intervals; not to exceed 4 doses (1 mg)   traMADol  (ULTRAM ) 50 MG tablet    Sig: Take 1-2 tablets (50-100 mg total) by mouth at bedtime as needed for severe pain (pain score 7-10).    Dispense:  60 tablet    Refill:  1    Fill one day early if pharmacy is closed on scheduled refill date.   Medications administered: We administered iohexol , lidocaine , ropivacaine  (PF) 2 mg/mL (0.2%), sodium chloride  flush, dexamethasone , lactated ringers , and midazolam .  See the medical record for exact dosing, route, and time of administration.  Follow-up plan:   Return in about 4 weeks (around 11/04/2023) for PPE, VV.       Right C-ESI 08/23/23, 10/07/23    Recent Visits Date Type Provider Dept  09/20/23 Office Visit Marcelino Nurse, MD [facility]  08/23/23 Office Visit Marcelino Nurse, MD [facility]  Showing recent visits within past 90 days and meeting all other requirements Today's Visits Date Type Provider Dept  10/07/23 Procedure visit Marcelino Nurse, MD [facility]  Showing today's visits and meeting all other requirements Future Appointments Date Type Provider Dept  11/04/23 Appointment Marcelino Nurse, MD [facility]  Showing  future appointments within next 90 days and meeting all other requirements  Disposition: Discharge home  Discharge (Date  Time): 10/07/2023;   hrs.   Primary Care Physician: Jyl Railing, MD Location: [facility] Note by: Nurse Marcelino, MD (TTS technology used.  I apologize for any typographical errors that were not detected and corrected.) Date: 10/07/2023; Time: 3:25 PM  Disclaimer:  Medicine is not an visual merchandiser. The only guarantee in medicine is that nothing is guaranteed. It is important to note that the decision to proceed with this intervention was based on the information collected from the patient. The Data and conclusions were drawn from the patient's questionnaire, the interview, and the physical examination. Because the information was provided in large part by the patient, it cannot be guaranteed that it has not been purposely or unconsciously manipulated. Every effort has been made to obtain as much relevant data as possible for this evaluation. It is important to note that the conclusions that lead to this procedure are derived in large part from the available data. Always take into account that the treatment will also be dependent on availability of resources and existing treatment guidelines, considered by other Pain Management Practitioners as being common knowledge and practice, at the time of the intervention. For Medico-Legal purposes, it is also important to point out that variation in procedural techniques and pharmacological choices are the acceptable norm. The indications, contraindications, technique, and results of the above procedure should only be interpreted and judged by a Board-Certified Interventional Pain Specialist with extensive familiarity and expertise in the same exact procedure and technique.

## 2023-10-08 ENCOUNTER — Telehealth: Payer: Self-pay

## 2023-10-08 ENCOUNTER — Ambulatory Visit (INDEPENDENT_AMBULATORY_CARE_PROVIDER_SITE_OTHER): Payer: Commercial Managed Care - PPO | Admitting: Podiatrist

## 2023-10-08 ENCOUNTER — Other Ambulatory Visit: Payer: Self-pay

## 2023-10-08 DIAGNOSIS — L603 Nail dystrophy: Secondary | ICD-10-CM

## 2023-10-08 MED ORDER — GABAPENTIN 100 MG PO CAPS
100.0000 mg | ORAL_CAPSULE | Freq: Every day | ORAL | 1 refills | Status: DC
  Filled 2023-10-08: qty 30, 30d supply, fill #0
  Filled 2023-11-09: qty 30, 30d supply, fill #1

## 2023-10-08 NOTE — Telephone Encounter (Signed)
 Called PP no answer. Left message to call if needed.

## 2023-10-08 NOTE — Progress Notes (Signed)
 Patient presents today for the 4th  laser treatment. Diagnosed with mycotic nail infection by Dr. Verta.   Toenail most affected 1&2 on left foot and 1st toe on right foot. Fungus seems cleared from the left first, however, the nail growth is extremely slow.  Some fungus likely still present right hallux nail especially borders.    See photo below-  taken 10/08/23 to monitor growth/ progress       All other systems are negative.   Laser therapy was administered to 1,2 left and 1 Right x 3 passes each.  Single pass laser treatment applied to all other nails.   Patient tolerated the treatment well. All safety precautions were in place.   She has a follow up appointment with Dr. Verta  on March 6th and another appointment for laser on November 26, 2023.

## 2023-10-11 ENCOUNTER — Other Ambulatory Visit: Payer: Self-pay | Admitting: Student in an Organized Health Care Education/Training Program

## 2023-10-11 ENCOUNTER — Other Ambulatory Visit: Payer: Self-pay

## 2023-10-11 DIAGNOSIS — M5412 Radiculopathy, cervical region: Secondary | ICD-10-CM

## 2023-10-11 DIAGNOSIS — M4802 Spinal stenosis, cervical region: Secondary | ICD-10-CM

## 2023-10-11 MED ORDER — TRAMADOL HCL 50 MG PO TABS: 50.0000 mg | ORAL_TABLET | Freq: Every evening | ORAL | 1 refills | Status: DC | PRN

## 2023-10-11 MED ORDER — TRAMADOL HCL 50 MG PO TABS: 50.0000 mg | ORAL_TABLET | Freq: Every evening | ORAL | 1 refills | Status: AC | PRN

## 2023-10-13 LAB — COMPLIANCE DRUG ANALYSIS, UR

## 2023-10-14 ENCOUNTER — Ambulatory Visit (INDEPENDENT_AMBULATORY_CARE_PROVIDER_SITE_OTHER): Payer: Commercial Managed Care - PPO | Admitting: Nurse Practitioner

## 2023-10-14 ENCOUNTER — Encounter (INDEPENDENT_AMBULATORY_CARE_PROVIDER_SITE_OTHER): Payer: Self-pay | Admitting: Nurse Practitioner

## 2023-10-14 VITALS — BP 130/82 | HR 88 | Resp 16 | Wt 168.4 lb

## 2023-10-14 DIAGNOSIS — I83813 Varicose veins of bilateral lower extremities with pain: Secondary | ICD-10-CM | POA: Diagnosis not present

## 2023-10-17 ENCOUNTER — Encounter (INDEPENDENT_AMBULATORY_CARE_PROVIDER_SITE_OTHER): Payer: Self-pay | Admitting: Nurse Practitioner

## 2023-10-17 NOTE — Progress Notes (Signed)
 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   Procedure: Sclerotherapy using hypertonic saline mixed with 1% Lidocaine was performed on the bilateral lower extremity. Compression wraps were placed. The patient tolerated the procedure well.

## 2023-10-18 ENCOUNTER — Ambulatory Visit: Payer: Commercial Managed Care - PPO

## 2023-10-18 DIAGNOSIS — M542 Cervicalgia: Secondary | ICD-10-CM

## 2023-10-18 DIAGNOSIS — M546 Pain in thoracic spine: Secondary | ICD-10-CM | POA: Diagnosis not present
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
## 2023-10-21 ENCOUNTER — Inpatient Hospital Stay: Payer: Commercial Managed Care - PPO

## 2023-10-22 ENCOUNTER — Other Ambulatory Visit: Payer: Commercial Managed Care - PPO

## 2023-10-25 ENCOUNTER — Inpatient Hospital Stay: Payer: Commercial Managed Care - PPO | Attending: Oncology

## 2023-10-25 ENCOUNTER — Ambulatory Visit: Payer: Commercial Managed Care - PPO

## 2023-10-25 DIAGNOSIS — D509 Iron deficiency anemia, unspecified: Secondary | ICD-10-CM | POA: Insufficient documentation

## 2023-10-25 DIAGNOSIS — M542 Cervicalgia: Secondary | ICD-10-CM

## 2023-10-25 DIAGNOSIS — M546 Pain in thoracic spine: Secondary | ICD-10-CM | POA: Diagnosis not present

## 2023-10-25 DIAGNOSIS — R748 Abnormal levels of other serum enzymes: Secondary | ICD-10-CM

## 2023-10-25 LAB — CBC WITH DIFFERENTIAL (CANCER CENTER ONLY)
Abs Immature Granulocytes: 0.01 10*3/uL (ref 0.00–0.07)
Basophils Absolute: 0 10*3/uL (ref 0.0–0.1)
Basophils Relative: 1 %
Eosinophils Absolute: 0.2 10*3/uL (ref 0.0–0.5)
Eosinophils Relative: 3 %
HCT: 40 % (ref 36.0–46.0)
Hemoglobin: 13.3 g/dL (ref 12.0–15.0)
Immature Granulocytes: 0 %
Lymphocytes Relative: 29 %
Lymphs Abs: 1.7 10*3/uL (ref 0.7–4.0)
MCH: 29.4 pg (ref 26.0–34.0)
MCHC: 33.3 g/dL (ref 30.0–36.0)
MCV: 88.3 fL (ref 80.0–100.0)
Monocytes Absolute: 0.5 10*3/uL (ref 0.1–1.0)
Monocytes Relative: 8 %
Neutro Abs: 3.3 10*3/uL (ref 1.7–7.7)
Neutrophils Relative %: 59 %
Platelet Count: 326 10*3/uL (ref 150–400)
RBC: 4.53 MIL/uL (ref 3.87–5.11)
RDW: 11.4 % — ABNORMAL LOW (ref 11.5–15.5)
WBC Count: 5.7 10*3/uL (ref 4.0–10.5)
nRBC: 0 % (ref 0.0–0.2)

## 2023-10-25 LAB — FERRITIN: Ferritin: 47 ng/mL (ref 11–307)

## 2023-10-25 LAB — VITAMIN B12: Vitamin B-12: 450 pg/mL (ref 180–914)

## 2023-10-25 LAB — IRON AND TIBC
Iron: 94 ug/dL (ref 28–170)
Saturation Ratios: 29 % (ref 10.4–31.8)
TIBC: 330 ug/dL (ref 250–450)
UIBC: 236 ug/dL
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
## 2023-10-26 ENCOUNTER — Inpatient Hospital Stay: Payer: Commercial Managed Care - PPO

## 2023-10-26 ENCOUNTER — Inpatient Hospital Stay: Payer: Commercial Managed Care - PPO | Admitting: Oncology

## 2023-10-26 ENCOUNTER — Encounter: Payer: Self-pay | Admitting: Oncology

## 2023-10-26 VITALS — BP 123/75 | HR 94 | Resp 16

## 2023-10-26 VITALS — BP 133/88 | HR 86 | Temp 97.8°F | Resp 18 | Wt 167.5 lb

## 2023-10-26 DIAGNOSIS — N6099 Unspecified benign mammary dysplasia of unspecified breast: Secondary | ICD-10-CM

## 2023-10-26 DIAGNOSIS — Z9884 Bariatric surgery status: Secondary | ICD-10-CM | POA: Diagnosis not present

## 2023-10-26 DIAGNOSIS — D509 Iron deficiency anemia, unspecified: Secondary | ICD-10-CM

## 2023-10-26 MED ORDER — IRON SUCROSE 20 MG/ML IV SOLN
200.0000 mg | Freq: Once | INTRAVENOUS | Status: AC
Start: 2023-10-26 — End: 2023-10-26
  Administered 2023-10-26: 200 mg via INTRAVENOUS
  Filled 2023-10-26: qty 10

## 2023-10-26 NOTE — Assessment & Plan Note (Addendum)
 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  She declined chemoprevention and genetic testing. Continue follows up with primary care provider for annual mammogram - she is overdue

## 2023-10-26 NOTE — Patient Instructions (Signed)
Iron Sucrose Injection What is this medication? IRON SUCROSE (EYE ern SOO krose) treats low levels of iron (iron deficiency anemia) in people with kidney disease. Iron is a mineral that plays an important role in making red blood cells, which carry oxygen from your lungs to the rest of your body. This medicine may be used for other purposes; ask your health care provider or pharmacist if you have questions. COMMON BRAND NAME(S): Venofer What should I tell my care team before I take this medication? They need to know if you have any of these conditions: Anemia not caused by low iron levels Heart disease High levels of iron in the blood Kidney disease Liver disease An unusual or allergic reaction to iron, other medications, foods, dyes, or preservatives Pregnant or trying to get pregnant Breastfeeding How should I use this medication? This medication is for infusion into a vein. It is given in a hospital or clinic setting. Talk to your care team about the use of this medication in children. While this medication may be prescribed for children as young as 2 years for selected conditions, precautions do apply. Overdosage: If you think you have taken too much of this medicine contact a poison control center or emergency room at once. NOTE: This medicine is only for you. Do not share this medicine with others. What if I miss a dose? Keep appointments for follow-up doses. It is important not to miss your dose. Call your care team if you are unable to keep an appointment. What may interact with this medication? Do not take this medication with any of the following: Deferoxamine Dimercaprol Other iron products This medication may also interact with the following: Chloramphenicol Deferasirox This list may not describe all possible interactions. Give your health care provider a list of all the medicines, herbs, non-prescription drugs, or dietary supplements you use. Also tell them if you smoke,  drink alcohol, or use illegal drugs. Some items may interact with your medicine. What should I watch for while using this medication? Visit your care team regularly. Tell your care team if your symptoms do not start to get better or if they get worse. You may need blood work done while you are taking this medication. You may need to follow a special diet. Talk to your care team. Foods that contain iron include: whole grains/cereals, dried fruits, beans, or peas, leafy green vegetables, and organ meats (liver, kidney). What side effects may I notice from receiving this medication? Side effects that you should report to your care team as soon as possible: Allergic reactions--skin rash, itching, hives, swelling of the face, lips, tongue, or throat Low blood pressure--dizziness, feeling faint or lightheaded, blurry vision Shortness of breath Side effects that usually do not require medical attention (report to your care team if they continue or are bothersome): Flushing Headache Joint pain Muscle pain Nausea Pain, redness, or irritation at injection site This list may not describe all possible side effects. Call your doctor for medical advice about side effects. You may report side effects to FDA at 1-800-FDA-1088. Where should I keep my medication? This medication is given in a hospital or clinic and will not be stored at home. NOTE: This sheet is a summary. It may not cover all possible information. If you have questions about this medicine, talk to your doctor, pharmacist, or health care provider.  2023 Elsevier/Gold Standard (2020-11-28 00:00:00)  

## 2023-10-26 NOTE — Assessment & Plan Note (Addendum)
 Labs are reviewed and discussed with patient. Both hemoglobin and iron level are stable.  Lab Results  Component Value Date   HGB 13.3 10/25/2023   TIBC 330 10/25/2023   IRONPCTSAT 29 10/25/2023   FERRITIN 47 10/25/2023    She will proceed with one dose of Venfoer 200mg  today as maintenance.   B12 level has normalized I recommend patient to take B12 supplementation daily
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
## 2023-11-01 ENCOUNTER — Ambulatory Visit: Payer: Commercial Managed Care - PPO

## 2023-11-03 ENCOUNTER — Telehealth: Payer: Self-pay

## 2023-11-03 NOTE — Telephone Encounter (Signed)
 Patient contacted for nurse portion of telephone visit with Dr. Cherylann Ratel tomorrow.

## 2023-11-04 ENCOUNTER — Ambulatory Visit: Payer: Commercial Managed Care - PPO | Admitting: Podiatry

## 2023-11-04 ENCOUNTER — Ambulatory Visit
Payer: Commercial Managed Care - PPO | Attending: Student in an Organized Health Care Education/Training Program | Admitting: Student in an Organized Health Care Education/Training Program

## 2023-11-04 ENCOUNTER — Encounter: Payer: Self-pay | Admitting: Podiatry

## 2023-11-04 ENCOUNTER — Other Ambulatory Visit: Payer: Self-pay

## 2023-11-04 DIAGNOSIS — M4802 Spinal stenosis, cervical region: Secondary | ICD-10-CM

## 2023-11-04 DIAGNOSIS — M5412 Radiculopathy, cervical region: Secondary | ICD-10-CM

## 2023-11-04 DIAGNOSIS — L603 Nail dystrophy: Secondary | ICD-10-CM | POA: Diagnosis not present

## 2023-11-04 DIAGNOSIS — G894 Chronic pain syndrome: Secondary | ICD-10-CM

## 2023-11-04 MED ORDER — TERBINAFINE HCL 250 MG PO TABS
250.0000 mg | ORAL_TABLET | Freq: Every day | ORAL | 0 refills | Status: DC
  Filled 2023-11-04: qty 30, 30d supply, fill #0

## 2023-11-04 NOTE — Progress Notes (Signed)
 She presents today for follow-up of her nail fungus hallux bilaterally and second toe left.  States that she has had for laser treatments and has taken 30 days of Lamisil which she has completed and is now started and almost completed the next 90 days.  She denies fever chills nausea vomiting muscle aches pains calf pain back pain chest pain shortness of breath.  She states that she does see clearing if she sees improvement she says they just will never look like the other nails but she is happy with what she is getting.  Objective: Vital signs are stable alert oriented x 3.  Pulses are palpable.  There is no erythema edema cellulitis drainage or odor to the foot.  Nail plates are thicker but much more clear and normal appearing.  Assessment: Well-healing onychomycosis with long-term therapy with Lamisil and laser therapy.  Plan: I am going to continue her Lamisil therapy 1 tablet every other day.  Dispense 30 tablets.  She will continue laser monthly.  And I will follow-up with her in 3 months.

## 2023-11-04 NOTE — Progress Notes (Signed)
 Patient: Elizabeth Good  Service Category: E/M  Provider: Edward Jolly, MD  DOB: 29-Apr-1966  DOS: 11/04/2023  Location: Office  MRN: [id number]  Setting: Ambulatory outpatient  Referring Provider: Leim Fabry, MD  Type: Established Patient  Specialty: Interventional Pain Management  PCP: Leim Fabry, MD  Location: Remote location  Delivery: TeleHealth     Virtual Encounter - Pain Management PROVIDER NOTE: Information contained herein reflects review and annotations entered in association with encounter. Interpretation of such information and data should be left to medically-trained personnel. Information provided to patient can be located elsewhere in the medical record under "Patient Instructions". Document created using STT-dictation technology, any transcriptional errors that may result from process are unintentional.    Contact & Pharmacy Preferred: [phone] Home: [phone] (home) Mobile: [phone] (mobile) E-mail: [email or web address]  [facility] [phone] [place], [place] - [address] AT [facility] OF [address] [place] [address] [place] [address] Phone: [phone] Fax: [phone]   Pre-screening  Elizabeth Good offered "in-person" vs "virtual" encounter. She indicated preferring virtual for this encounter.   Reason COVID-19*  Social distancing based on CDC and AMA recommendations.   I contacted Elizabeth Good on 11/04/2023 via telephone.      I clearly identified myself as Edward Jolly, MD. I verified that I was speaking with the correct person using two identifiers (Name: Elizabeth Good, and date of birth: 1965-12-31).  Consent I sought verbal advanced consent from Elizabeth Good for virtual visit interactions. I informed Elizabeth Good of possible security and privacy concerns, risks, and limitations associated with providing "not-in-person" medical evaluation and management services. I also informed Elizabeth Good of the availability of  "in-person" appointments. Finally, I informed her that there would be a charge for the virtual visit and that she could be  personally, fully or partially, financially responsible for it. Elizabeth Good expressed understanding and agreed to proceed.   Historic Elements   Elizabeth Good is a 58 y.o. year old, female patient evaluated today after our last contact on 10/07/2023. Ms. Schall  has a past medical history of Allergy, Anemia, Bartholin cyst (1990), Basal cell carcinoma, Curvature of spine, Family history of adverse reaction to anesthesia, GERD (gastroesophageal reflux disease), Gestational diabetes (08/11/2022), High cholesterol, History of bladder infections, History of hiatal hernia, History of kidney infection, History of kidney stones, Hypertension, Kidney stones, Low serum iron, Meningitis, PONV (postoperative nausea and vomiting), PVC's (premature ventricular contractions), Renal disorder, Scoliosis, Seizures (HCC), and Wears contact lenses. She also  has a past surgical history that includes Tonsillectomy; Cholecystectomy (2011); Gastric bypass (2011); Bladder surgery; Rectal surgery; Foot surgery (Left); Septoplasty; Incontinence surgery; Anterior and posterior vaginal repair; Endometrial ablation (2011); Ganglion cyst excision; Combined hysteroscopy diagnostic / D&C (12/06/2014); Extracorporeal shock wave lithotripsy (Right, 08/25/2018); Breast biopsy (Left, 06/29/2019); Breast biopsy (Left, 07/14/2019); Laparoscopic bilateral salpingectomy (08/08/2019); biopsy (N/A, 08/08/2019); Oophorectomy (Left, 2020); Lithotripsy (2016); Colonoscopy with propofol (N/A, 02/07/2021); polypectomy (N/A, 02/07/2021); Breast biopsy (Left, 08/11/2019); and Breast excisional biopsy (Left, 09/21/2019). Elizabeth Good has a current medication list which includes the following prescription(s): cetirizine, vitamin d3, cyanocobalamin, gabapentin, lamotrigine, multivitamin, pantoprazole, wegovy, tizanidine, tramadol, and  terbinafine. She  reports that she has never smoked. She has never used smokeless tobacco. She reports current alcohol use. She reports that she does not use drugs. Elizabeth Good is allergic to dermabase [albolene], other, latex, penicillins, and sulfa antibiotics.  BMI: Estimated body mass index is 28.75 kg/m as calculated from  the following:   Height as of 10/07/23: 5\' 4"  (1.626 m).   Weight as of 10/26/23: 167 lb 8 oz (76 kg). Last encounter: 09/20/2023. Last procedure: 10/07/2023.  HPI  Today, she is being contacted for a post-procedure assessment.   Post-procedure evaluation    Procedure: Cervical Epidural Steroid injection (CESI) (Interlaminar) #2  Laterality: Right  Level: C7-T1 Imaging: Fluoroscopy-assisted DOS: 10/07/2023  Performed by: Edward Jolly, MD Anesthesia: Local anesthesia (1-2% Lidocaine) Sedation: Minimal Sedation                         Purpose: Diagnostic/Therapeutic Indications: Cervicalgia, cervical radicular pain, degenerative disc disease, severe enough to impact quality of life or function. 1. Cervical radicular pain (right)   2. Foraminal stenosis of cervical region    NAS-11 score:   Pre-procedure: 5 /10   Post-procedure: 5 /10     Effectiveness:  Initial hour after procedure: 100 %  Subsequent 4-6 hours post-procedure: 100 %  Analgesia past initial 6 hours: 75 % (Day 9 headache started and headache sporadic but overall less frequancy of headache. Neck ROM has improved. Neck pain has improved 50-75% depending on the day.)  Ongoing improvement:  Analgesic:  50-75% Function: Elizabeth Good reports improvement in function ROM: Elizabeth Good reports improvement in ROM   Laboratory Chemistry Profile   Renal Lab Results  Component Value Date   BUN 14 07/07/2023   CREATININE 0.86 07/07/2023   BCR 16 07/07/2023   GFRAA >60 04/29/2017   GFRNONAA >60 04/29/2017    Hepatic Lab Results  Component Value Date   AST 25 07/07/2023   ALT 17 07/07/2023   ALBUMIN 4.4  07/07/2023   ALKPHOS 124 (H) 07/07/2023    Electrolytes Lab Results  Component Value Date   NA 141 07/07/2023   K 4.0 07/07/2023   CL 100 07/07/2023   CALCIUM 9.9 07/07/2023    Bone No results found for: "VD25OH", "VD125OH2TOT", "JY7829FA2", "ZH0865HQ4", "25OHVITD1", "25OHVITD2", "25OHVITD3", "TESTOFREE", "TESTOSTERONE"  Inflammation (CRP: Acute Phase) (ESR: Chronic Phase) No results found for: "CRP", "ESRSEDRATE", "LATICACIDVEN"       Note: Above Lab results reviewed.   Assessment  The primary encounter diagnosis was Cervical radicular pain (right). Diagnoses of Foraminal stenosis of cervical region and Chronic pain syndrome were also pertinent to this visit.  Plan of Care  Excellent relief after C-ESI, repeat PRN  Orders:  Orders Placed This Encounter  Procedures   Cervical Epidural Injection    Sedation: Patient's choice. Purpose: Diagnostic/Therapeutic Indication(s): Radiculitis and cervicalgia associater with cervical degenerative disc disease.    Standing Status:   Standing    Number of Occurrences:   2    Next Expected Occurrence:   01/21/2024    Expiration Date:   05/06/2024    Scheduling Instructions:     Procedure: Cervical Epidural Steroid Injection/Block     Level(s): C7-T1     Laterality: TBD     Timeframe: As soon as schedule allows    Where will this procedure be performed?:   [facility]             Enyah Moman   Follow-up plan:   Return for patient will call to schedule F2F appt prn.      Right C-ESI 08/23/23, 10/07/23    Recent Visits Date Type Provider Dept  10/07/23 Procedure visit Edward Jolly, MD [facility]  09/20/23 Office Visit Edward Jolly, MD [facility]  08/23/23 Office Visit Edward Jolly,  MD [facility]  Showing recent visits within past 90 days and meeting all other requirements Today's Visits Date Type Provider Dept  11/04/23 Office Visit Edward Jolly, MD [facility]  Showing today's  visits and meeting all other requirements Future Appointments No visits were found meeting these conditions. Showing future appointments within next 90 days and meeting all other requirements  I discussed the assessment and treatment plan with the patient. The patient was provided an opportunity to ask questions and all were answered. The patient agreed with the plan and demonstrated an understanding of the instructions.  Patient advised to call back or seek an in-person evaluation if the symptoms or condition worsens.  Duration of encounter: .  Note by: Edward Jolly, MD Date: 11/04/2023; Time: 3:06 PM

## 2023-11-08 ENCOUNTER — Ambulatory Visit: Payer: Commercial Managed Care - PPO | Attending: Orthopedic Surgery

## 2023-11-08 DIAGNOSIS — M542 Cervicalgia: Secondary | ICD-10-CM | POA: Insufficient documentation

## 2023-11-08 DIAGNOSIS — M546 Pain in thoracic spine: Secondary | ICD-10-CM | POA: Diagnosis not present
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
## 2023-11-10 ENCOUNTER — Telehealth (INDEPENDENT_AMBULATORY_CARE_PROVIDER_SITE_OTHER): Payer: Self-pay | Admitting: Vascular Surgery

## 2023-11-10 NOTE — Telephone Encounter (Signed)
 LVM for pt Elizabeth Good and advise of if she is traveling overseas as it was recommended by Sheppard Plumber, NP, not to fly so soon after sclerotherapy.

## 2023-11-11 ENCOUNTER — Encounter (INDEPENDENT_AMBULATORY_CARE_PROVIDER_SITE_OTHER): Payer: Self-pay | Admitting: Nurse Practitioner

## 2023-11-11 ENCOUNTER — Ambulatory Visit (INDEPENDENT_AMBULATORY_CARE_PROVIDER_SITE_OTHER): Payer: Commercial Managed Care - PPO | Admitting: Nurse Practitioner

## 2023-11-11 VITALS — BP 149/84 | HR 78 | Resp 18 | Ht 64.0 in | Wt 167.0 lb

## 2023-11-11 DIAGNOSIS — I83813 Varicose veins of bilateral lower extremities with pain: Secondary | ICD-10-CM | POA: Diagnosis not present

## 2023-11-14 NOTE — Progress Notes (Signed)
 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   Procedure: Sclerotherapy using hypertonic saline mixed with 1% Lidocaine was performed on the bilateral lower extremity. Compression wraps were placed. The patient tolerated the procedure well.

## 2023-11-15 ENCOUNTER — Ambulatory Visit: Payer: Commercial Managed Care - PPO

## 2023-11-15 DIAGNOSIS — M546 Pain in thoracic spine: Secondary | ICD-10-CM

## 2023-11-15 DIAGNOSIS — M542 Cervicalgia: Secondary | ICD-10-CM
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
## 2023-11-22 ENCOUNTER — Ambulatory Visit: Payer: Commercial Managed Care - PPO

## 2023-11-22 DIAGNOSIS — M542 Cervicalgia: Secondary | ICD-10-CM

## 2023-11-22 DIAGNOSIS — M546 Pain in thoracic spine: Secondary | ICD-10-CM
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
## 2023-11-26 ENCOUNTER — Ambulatory Visit (INDEPENDENT_AMBULATORY_CARE_PROVIDER_SITE_OTHER): Payer: Commercial Managed Care - PPO

## 2023-11-26 DIAGNOSIS — L603 Nail dystrophy: Secondary | ICD-10-CM

## 2023-11-26 NOTE — Progress Notes (Signed)
 Patient presents today for the 5th laser treatment. Diagnosed with mycotic nail infection by Dr. Al Corpus.   Toenail most affected 1 & 2 on left foot and 1st on right foot. Nail growth is still extremely slow..  All other systems are negative.  Nails were clipped & filed thin. Laser therapy was administered to 1-5 toenails bilaterally and patient tolerated the treatment well. All safety precautions were in place.    Follow up in 6 weeks for laser # 8.

## 2023-11-29 ENCOUNTER — Other Ambulatory Visit: Payer: Self-pay

## 2023-11-29 ENCOUNTER — Other Ambulatory Visit (HOSPITAL_COMMUNITY): Payer: Self-pay

## 2023-11-29 ENCOUNTER — Ambulatory Visit: Payer: Commercial Managed Care - PPO

## 2023-11-29 ENCOUNTER — Other Ambulatory Visit: Payer: Self-pay | Admitting: Orthopedic Surgery

## 2023-11-29 DIAGNOSIS — M542 Cervicalgia: Secondary | ICD-10-CM

## 2023-11-29 DIAGNOSIS — M546 Pain in thoracic spine: Secondary | ICD-10-CM | POA: Diagnosis not present

## 2023-11-29 MED ORDER — GABAPENTIN 100 MG PO CAPS
100.0000 mg | ORAL_CAPSULE | Freq: Every day | ORAL | 1 refills | Status: AC
Start: 2023-11-29 — End: ?
  Filled 2023-11-29 – 2023-12-10 (×3): qty 30, 30d supply, fill #0
  Filled 2024-01-07: qty 30, 30d supply, fill #1
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
## 2023-11-30 ENCOUNTER — Other Ambulatory Visit: Payer: Self-pay

## 2023-12-06 ENCOUNTER — Ambulatory Visit: Attending: Orthopedic Surgery

## 2023-12-06 DIAGNOSIS — M546 Pain in thoracic spine: Secondary | ICD-10-CM | POA: Diagnosis not present

## 2023-12-06 DIAGNOSIS — M542 Cervicalgia: Secondary | ICD-10-CM | POA: Diagnosis not present
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
## 2023-12-08 ENCOUNTER — Encounter

## 2023-12-10 ENCOUNTER — Other Ambulatory Visit: Payer: Self-pay

## 2023-12-13 ENCOUNTER — Ambulatory Visit

## 2023-12-13 DIAGNOSIS — M546 Pain in thoracic spine: Secondary | ICD-10-CM

## 2023-12-13 DIAGNOSIS — M542 Cervicalgia: Secondary | ICD-10-CM
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
## 2023-12-15 ENCOUNTER — Ambulatory Visit (INDEPENDENT_AMBULATORY_CARE_PROVIDER_SITE_OTHER): Payer: Commercial Managed Care - PPO | Admitting: Nurse Practitioner

## 2023-12-15 ENCOUNTER — Encounter (INDEPENDENT_AMBULATORY_CARE_PROVIDER_SITE_OTHER): Payer: Self-pay | Admitting: Nurse Practitioner

## 2023-12-15 VITALS — BP 135/81 | HR 93 | Resp 16 | Wt 167.0 lb

## 2023-12-15 DIAGNOSIS — I83813 Varicose veins of bilateral lower extremities with pain: Secondary | ICD-10-CM | POA: Diagnosis not present

## 2023-12-15 NOTE — Progress Notes (Signed)
 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   Procedure: Sclerotherapy using hypertonic saline mixed with 1% Lidocaine was performed on the bilateral lower extremity. Compression wraps were placed. The patient tolerated the procedure well.

## 2023-12-20 ENCOUNTER — Ambulatory Visit

## 2023-12-27 ENCOUNTER — Ambulatory Visit

## 2023-12-27 DIAGNOSIS — M542 Cervicalgia: Secondary | ICD-10-CM

## 2023-12-27 DIAGNOSIS — M546 Pain in thoracic spine: Secondary | ICD-10-CM

## 2023-12-27 DIAGNOSIS — R300 Dysuria: Secondary | ICD-10-CM | POA: Diagnosis not present
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
## 2023-12-29 ENCOUNTER — Other Ambulatory Visit: Payer: Self-pay | Admitting: Family Medicine

## 2023-12-29 ENCOUNTER — Ambulatory Visit
Admission: RE | Admit: 2023-12-29 | Discharge: 2023-12-29 | Disposition: A | Discharge status: Home / Self Care | Source: Ambulatory Visit | Attending: Family Medicine | Admitting: Family Medicine

## 2023-12-29 ENCOUNTER — Ambulatory Visit
Admission: RE | Admit: 2023-12-29 | Discharge: 2023-12-29 | Disposition: A | Discharge status: Home / Self Care | Attending: Family Medicine | Admitting: Family Medicine

## 2023-12-29 DIAGNOSIS — R319 Hematuria, unspecified: Secondary | ICD-10-CM

## 2023-12-29 DIAGNOSIS — R109 Unspecified abdominal pain: Secondary | ICD-10-CM

## 2023-12-29 DIAGNOSIS — R10A0 Flank pain, unspecified side: Secondary | ICD-10-CM

## 2024-01-03 ENCOUNTER — Ambulatory Visit: Attending: Orthopedic Surgery

## 2024-01-03 DIAGNOSIS — M542 Cervicalgia: Secondary | ICD-10-CM | POA: Insufficient documentation

## 2024-01-03 DIAGNOSIS — M546 Pain in thoracic spine: Secondary | ICD-10-CM | POA: Diagnosis not present
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
## 2024-01-04 ENCOUNTER — Ambulatory Visit
Admission: RE | Admit: 2024-01-04 | Discharge: 2024-01-04 | Disposition: A | Discharge status: Home / Self Care | Source: Ambulatory Visit | Attending: Family Medicine | Admitting: Family Medicine

## 2024-01-04 DIAGNOSIS — R109 Unspecified abdominal pain: Secondary | ICD-10-CM | POA: Insufficient documentation

## 2024-01-04 DIAGNOSIS — N200 Calculus of kidney: Secondary | ICD-10-CM | POA: Diagnosis not present

## 2024-01-04 DIAGNOSIS — R319 Hematuria, unspecified: Secondary | ICD-10-CM | POA: Diagnosis not present

## 2024-01-07 ENCOUNTER — Ambulatory Visit (INDEPENDENT_AMBULATORY_CARE_PROVIDER_SITE_OTHER): Payer: Commercial Managed Care - PPO | Admitting: *Deleted

## 2024-01-07 DIAGNOSIS — L603 Nail dystrophy: Secondary | ICD-10-CM

## 2024-01-07 NOTE — Progress Notes (Signed)
 Patient presents today for the 6th laser treatment. Diagnosed with mycotic nail infection by Dr. Lara Plants.   Toenail most affected hallux bilateral and 2nd left. Coloration of nails do look better, but there is still some thickness that has not improved  All other systems are negative.  Nails were clipped & filed thin. Laser therapy was administered to 1-5 toenails bilaterally and patient tolerated the treatment well. All safety precautions were in place.    Patient has completed the recommended laser treatments. He will follow up with Dr. Lara Plants in 3 months to evaluate progress.

## 2024-01-10 ENCOUNTER — Ambulatory Visit

## 2024-01-10 ENCOUNTER — Other Ambulatory Visit: Payer: Self-pay | Admitting: Urology

## 2024-01-10 DIAGNOSIS — M542 Cervicalgia: Secondary | ICD-10-CM

## 2024-01-10 DIAGNOSIS — M546 Pain in thoracic spine: Secondary | ICD-10-CM

## 2024-01-10 DIAGNOSIS — N200 Calculus of kidney: Secondary | ICD-10-CM | POA: Diagnosis not present

## 2024-01-10 DIAGNOSIS — R1084 Generalized abdominal pain: Secondary | ICD-10-CM | POA: Diagnosis not present

## 2024-01-10 DIAGNOSIS — Z87442 Personal history of urinary calculi: Secondary | ICD-10-CM | POA: Diagnosis not present

## 2024-01-10 NOTE — Progress Notes (Signed)
 Left voicemail with arrival time 0600 on Monday 5/23 (called back to correct date to Friday 5/23), bring blue folder, stop wegovy  by Thursday 5/15, If needed, do not take aspirin after Monday night prior to procedure, and stop all vitamins, supplements on Tuesday 5/20, we will review all other medications when we call back, bring blue folder without signatures, dates, times, wear comfortable clothing with  no metal from waist down and do not wear flip flops, sandals, or crocs. You may call us  back on [phone] today and we will also try to call back I spoke with the patient about this.

## 2024-01-10 NOTE — Progress Notes (Signed)
 Spoke with patient and agreed that tomorrow or Wednesday morning any time after 0700 will be a good time to call her with instructions, etc
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
## 2024-01-12 ENCOUNTER — Other Ambulatory Visit (HOSPITAL_COMMUNITY): Payer: Self-pay

## 2024-01-12 ENCOUNTER — Encounter (HOSPITAL_COMMUNITY): Payer: Self-pay | Admitting: Urology

## 2024-01-12 ENCOUNTER — Other Ambulatory Visit: Payer: Self-pay

## 2024-01-12 NOTE — Progress Notes (Signed)
 Spoke w/ via phone for pre-op interview---Pt Lab needs dos----KUB         Lab results------ COVID test -----patient states asymptomatic no test needed Arrive at -------0600 NPO after MN  Pre-Surgery Ensure or G2:  Med rec completed Medications to take morning of surgery -----Gabapentin , Protonix , Zyrtec, Lamisil , Lamotrigine , Tylenol  if needed  Diabetic medication -----  GLP1 agonist last dose: 01/03/2024 GLP1 instructions:  Patient instructed no nail polish to be worn day of surgery Patient instructed to bring photo id and insurance card day of surgery Patient aware to have Driver (ride ) / caregiver    for 24 hours after surgery -dtr- Maddie [phone]  Patient Special Instructions ----- Pre-Op special Instructions -----per Lanis Pitcher and Litho,including laxative, hydrate well and light dinner day before procedure. Stop Ibuprofen , all vitamins,  by Tuesday prior to procedure and no aspirin past Monday night prior to procedure  Patient verbalized understanding of instructions that were given at this phone interview. Patient denies chest pain, sob, fever, cough at the interview.

## 2024-01-13 ENCOUNTER — Other Ambulatory Visit (HOSPITAL_COMMUNITY): Payer: Self-pay

## 2024-01-13 ENCOUNTER — Other Ambulatory Visit: Payer: Self-pay

## 2024-01-13 MED ORDER — URELLE 81 MG PO TABS
81.0000 mg | ORAL_TABLET | Freq: Four times a day (QID) | ORAL | 0 refills | Status: AC | PRN
  Filled 2024-01-13: qty 30, 8d supply, fill #0

## 2024-01-17 ENCOUNTER — Ambulatory Visit

## 2024-01-17 ENCOUNTER — Other Ambulatory Visit: Payer: Self-pay

## 2024-01-17 DIAGNOSIS — M546 Pain in thoracic spine: Secondary | ICD-10-CM

## 2024-01-17 DIAGNOSIS — M542 Cervicalgia: Secondary | ICD-10-CM | POA: Diagnosis not present
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
## 2024-01-18 ENCOUNTER — Encounter (INDEPENDENT_AMBULATORY_CARE_PROVIDER_SITE_OTHER): Payer: Self-pay

## 2024-01-19 ENCOUNTER — Ambulatory Visit (INDEPENDENT_AMBULATORY_CARE_PROVIDER_SITE_OTHER): Admitting: Nurse Practitioner

## 2024-01-19 ENCOUNTER — Other Ambulatory Visit: Payer: Self-pay

## 2024-01-19 ENCOUNTER — Encounter (INDEPENDENT_AMBULATORY_CARE_PROVIDER_SITE_OTHER): Payer: Self-pay | Admitting: Nurse Practitioner

## 2024-01-19 VITALS — BP 138/78 | HR 80 | Resp 16 | Wt 162.8 lb

## 2024-01-19 DIAGNOSIS — I83813 Varicose veins of bilateral lower extremities with pain: Secondary | ICD-10-CM

## 2024-01-21 ENCOUNTER — Encounter (HOSPITAL_COMMUNITY)
Admission: RE | Disposition: A | Discharge status: Home / Self Care | Payer: Self-pay | Source: Home / Self Care | Attending: Urology

## 2024-01-21 ENCOUNTER — Ambulatory Visit (HOSPITAL_COMMUNITY)

## 2024-01-21 ENCOUNTER — Other Ambulatory Visit: Payer: Self-pay

## 2024-01-21 ENCOUNTER — Ambulatory Visit (HOSPITAL_COMMUNITY)
Admission: RE | Admit: 2024-01-21 | Discharge: 2024-01-21 | Disposition: A | Discharge status: Home / Self Care | Attending: Urology | Admitting: Urology

## 2024-01-21 ENCOUNTER — Encounter (HOSPITAL_COMMUNITY): Payer: Self-pay | Admitting: Urology

## 2024-01-21 DIAGNOSIS — Z87442 Personal history of urinary calculi: Secondary | ICD-10-CM | POA: Diagnosis not present

## 2024-01-21 DIAGNOSIS — N200 Calculus of kidney: Secondary | ICD-10-CM | POA: Diagnosis not present

## 2024-01-21 HISTORY — PX: EXTRACORPOREAL SHOCK WAVE LITHOTRIPSY: SHX1557

## 2024-01-21 SURGERY — LITHOTRIPSY, ESWL
Anesthesia: LOCAL | Laterality: Right

## 2024-01-21 MED ORDER — DIPHENHYDRAMINE HCL 25 MG PO CAPS
25.0000 mg | ORAL_CAPSULE | ORAL | Status: AC
  Administered 2024-01-21: 25 mg via ORAL
  Filled 2024-01-21: qty 1

## 2024-01-21 MED ORDER — TRAMADOL HCL 50 MG PO TABS: 50.0000 mg | ORAL_TABLET | Freq: Four times a day (QID) | ORAL | 0 refills | Status: AC | PRN

## 2024-01-21 MED ORDER — SODIUM CHLORIDE 0.9 % IV SOLN: INTRAVENOUS | Status: DC

## 2024-01-21 MED ORDER — ONDANSETRON HCL 4 MG/2ML IJ SOLN
INTRAMUSCULAR | Status: AC
  Filled 2024-01-21: qty 2

## 2024-01-21 MED ORDER — DIAZEPAM 5 MG PO TABS
10.0000 mg | ORAL_TABLET | ORAL | Status: AC
  Administered 2024-01-21: 10 mg via ORAL
  Filled 2024-01-21: qty 2

## 2024-01-21 MED ORDER — CIPROFLOXACIN HCL 500 MG PO TABS
500.0000 mg | ORAL_TABLET | ORAL | Status: AC
  Administered 2024-01-21: 500 mg via ORAL
  Filled 2024-01-21: qty 1

## 2024-01-21 MED ORDER — BISACODYL 5 MG PO TBEC: 5.0000 mg | DELAYED_RELEASE_TABLET | Freq: Every day | ORAL | Status: DC | PRN

## 2024-01-21 MED ORDER — ONDANSETRON HCL 4 MG PO TABS: 4.0000 mg | ORAL_TABLET | Freq: Three times a day (TID) | ORAL | 0 refills | Status: AC | PRN

## 2024-01-21 NOTE — Discharge Instructions (Addendum)
 See [facility] discharge instructions in chart.

## 2024-01-21 NOTE — Op Note (Signed)
 See [employer] [facility] OP note scanned into chart. Also because of the size, density, location and other factors that cannot be anticipated I feel this will likely be a staged procedure. This fact supersedes any indication in the scanned [place] [facility] operative note to the contrary.

## 2024-01-21 NOTE — H&P (Signed)
 1. possible right renal stone -patient had some intermittent right flank pain radiating down to her groin over the last several weeks. She has had a KUB and a renal ultrasound done. KUB clearly demonstrates a calcification that looks to be in the renal pelvis. Elizabeth Good had a ultrasound the following which which shows no right hydro and a hyperechoic area in the right lower pole appears consistent with a stone. She has had several stones before and has had lithotripsy twice.   No acute complaints today. She feels well today but has had pain intermittently. Denies nausea or vomiting.   Does not take any blood thinners. Is on weekly Wegovy .     ALLERGIES: Dermabond - Itching Latex - Skin Rash PCN - Skin Rash Sulfa - Skin Rash    MEDICATIONS: ZyrTEC 10 MG Tablet Chewable  Gabapentin  100 MG Capsule  LaMICtal  200 MG Tablet  Multi Vitamin  Protonix  40 MG Tablet Delayed Release  Terbinafine  HCl 250 MG Tablet  Vitamin B12  Vitamin D3  Wegovy      GU PSH: None     PSH Notes: T&A  Septoplasty  Bunlonectomy  Bladder Sling & Post Repair, 2015  [place] Lithotripsy, 2018-2019   NON-GU PSH: Breast lumpectomy, Left - 2021 Gastric bypass - 2011 Remove Gallbladder - 2011 Salpingo Oophorectomy, Bilateral - 2020     GU PMH: None     PMH Notes: History of kidney stones    NON-GU PMH: GERD Hypertension Seizure disorder Skin Cancer, History    FAMILY HISTORY: 1 Daughter - Runs in Family 1 son - Runs in Family Bladder Cancer - Mother Death - Father Dementia - Father heart failure - Father Hypertension - Father Kidney Failure - Grandmother melanoma - Father Prostate Cancer - Father   SOCIAL HISTORY: Marital Status: Divorced Preferred Language: English; Ethnicity: Not Hispanic Or Latino; Race: White Current Smoking Status: Patient has never smoked.   Tobacco Use Assessment Completed: Used Tobacco in last 30 days? Does not use smokeless tobacco. Social Drinker.  Does not use  drugs. Drinks 3 caffeinated drinks per day. Has not had a blood transfusion.    REVIEW OF SYSTEMS:    GU Review Female:   Patient reports frequent urination, burning /pain with urination, and get up at night to urinate. Patient denies hard to postpone urination, leakage of urine, stream starts and stops, trouble starting your stream, have to strain to urinate, and being pregnant.  Gastrointestinal (Upper):   Patient reports nausea and indigestion/ heartburn. Patient denies vomiting.  Gastrointestinal (Lower):   Patient reports constipation and diarrhea.   Constitutional:   Patient reports weight loss and fatigue. Patient denies fever and night sweats.  Skin:   Patient reports itching. Patient denies skin rash/ lesion.  Eyes:   Patient denies blurred vision and double vision.  Ears/ Nose/ Throat:   Patient reports sinus problems. Patient denies sore throat.  Hematologic/Lymphatic:   Patient denies swollen glands and easy bruising.  Cardiovascular:   Patient denies leg swelling and chest pains.  Respiratory:   Patient reports cough. Patient denies shortness of breath.  Endocrine:   Patient denies excessive thirst.  Musculoskeletal:   Patient reports back pain. Patient denies joint pain.  Neurological:   Patient denies headaches and dizziness.  Psychologic:   Patient denies depression and anxiety.   Notes: hematuria, uri    VITAL SIGNS:      01/10/2024 08:29 AM  Weight 163 lb / 73.94 kg  Height 65 in / 165.1 cm  BP 144/82 mmHg  Pulse 76 /min  Temperature 97.5 F / 36.3 C  BMI 27.1 kg/m   GU PHYSICAL EXAMINATION:    External Genitalia: No hirsutism, no rash, no scarring, no cyst, no erythematous lesion, no papular lesion, no blanched lesion, no warty lesion. No edema.  Urethral Meatus: Normal size. Normal position. No discharge.  Urethra: No tenderness, no mass, no scarring. No hypermobility. No leakage.  Bladder: Normal to palpation, no tenderness, no mass, normal size.  Vagina: No  atrophy, no stenosis. No rectocele. No cystocele. No enterocele.  Cervix: No inflammation, no discharge, no lesion, no tenderness, no wart.  Uterus: Normal size. Normal consistency. Normal position. No mobility. No descent.  Adnexa / Parametria: No tenderness. No adnexal mass. Normal left ovary. Normal right ovary.  Anus and Perineum: No hemorrhoids. No anal stenosis. No rectal fissure, no anal fissure. No edema, no dimple, no perineal tenderness, no anal tenderness.   MULTI-SYSTEM PHYSICAL EXAMINATION:    Constitutional: Well-nourished. No physical deformities. Normally developed. Good grooming.  Neck: Neck symmetrical, not swollen. Normal tracheal position.  Respiratory: No labored breathing, no use of accessory muscles.   Cardiovascular: Normal temperature, normal extremity pulses, no swelling, no varicosities.  Lymphatic: No enlargement of neck, axillae, groin.  Skin: No paleness, no jaundice, no cyanosis. No lesion, no ulcer, no rash.  Neurologic / Psychiatric: Oriented to time, oriented to place, oriented to person. No depression, no anxiety, no agitation.  Gastrointestinal: No mass, no tenderness, no rigidity, non obese abdomen.  Eyes: Normal conjunctivae. Normal eyelids.  Ears, Nose, Mouth, and Throat: Left ear no scars, no lesions, no masses. Right ear no scars, no lesions, no masses. Nose no scars, no lesions, no masses. Normal hearing. Normal lips.  Musculoskeletal: Normal gait and station of head and neck.     Complexity of Data:  Source Of History:  Patient, Medical Record Summary  Records Review:   Previous Doctor Records, Previous Patient Records  Urine Test Review:   Urinalysis  X-Ray Review: Outside Ultrasound: Reviewed Films. Reviewed Report. Discussed With Patient.  Outside X-Ray: Reviewed Films. Reviewed Report. Discussed With Patient.     PROCEDURES: None   ASSESSMENT:      ICD-10 Details  1 GU:   Renal calculus - N20.0 Undiagnosed New Problem  2   Flank Pain -  R10.84 Undiagnosed New Problem  3   History of urolithiasis - Z87.442 Undiagnosed New Problem   PLAN:           Orders Labs CULTURE, URINE          Document Letter(s):  Created for Patient: Clinical Summary         Notes:   We discussed the management of urinary stones. These options include observation, ureteroscopy, and shockwave lithotripsy. We discussed which options are relevant to these particular stones. We discussed the natural history of stones as well as the complications of untreated stones and the impact on quality of life without treatment as well as with each of the above listed treatments. We also discussed the efficacy of each treatment in its ability to clear the stone burden. With any of these management options I discussed the signs and symptoms of infection and the need for emergent treatment should these be experienced. For each option we discussed the ability of each procedure to clear the patient of their stone burden.   For observation I described the risks which include but are not limited to silent renal damage, life-threatening infection, need for  emergent surgery, failure to pass stone, and pain.   For ureteroscopy I described the risks which include heart attack, stroke, pulmonary embolus, death, bleeding, infection, damage to contiguous structures, positioning injury, ureteral stricture, ureteral avulsion, ureteral injury, need for ureteral stent, inability to perform ureteroscopy, need for an interval procedure, inability to clear stone burden, stent discomfort and pain.   For shockwave lithotripsy I described the risks which include arrhythmia, kidney contusion, kidney hemorrhage, need for transfusion, pain, inability to break up stone, inability to pass stone fragments, Steinstrasse, infection associated with obstructing stones, need for different surgical procedure, need for repeat shockwave lithotripsy.   I recommended a CT scan today. We reviewed the  limitations of KUB and renal ultrasound in diagnosing stones and determining definitive characteristics. Elizabeth Good declined today. She wishes to move forward with ESWL which I feel is reasonable. We reviewed warning signs and symptoms for which she should notify the office in the meantime. All questions answered

## 2024-01-24 ENCOUNTER — Encounter (INDEPENDENT_AMBULATORY_CARE_PROVIDER_SITE_OTHER): Payer: Self-pay | Admitting: Nurse Practitioner

## 2024-01-24 NOTE — Progress Notes (Signed)
 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   Procedure: Sclerotherapy using hypertonic saline mixed with 1% Lidocaine was performed on the bilateral lower extremity. Compression wraps were placed. The patient tolerated the procedure well.

## 2024-01-25 ENCOUNTER — Encounter (HOSPITAL_COMMUNITY): Payer: Self-pay | Admitting: Urology

## 2024-01-26 ENCOUNTER — Ambulatory Visit

## 2024-01-31 ENCOUNTER — Ambulatory Visit: Attending: Orthopedic Surgery

## 2024-01-31 DIAGNOSIS — M546 Pain in thoracic spine: Secondary | ICD-10-CM | POA: Diagnosis not present

## 2024-01-31 DIAGNOSIS — M542 Cervicalgia: Secondary | ICD-10-CM | POA: Diagnosis not present
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
## 2024-02-07 ENCOUNTER — Ambulatory Visit

## 2024-02-07 DIAGNOSIS — M542 Cervicalgia: Secondary | ICD-10-CM

## 2024-02-07 DIAGNOSIS — M546 Pain in thoracic spine: Secondary | ICD-10-CM

## 2024-02-09 ENCOUNTER — Other Ambulatory Visit: Payer: Self-pay

## 2024-02-09 ENCOUNTER — Encounter: Payer: Self-pay | Admitting: Podiatry

## 2024-02-09 ENCOUNTER — Ambulatory Visit: Admitting: Podiatry

## 2024-02-09 DIAGNOSIS — L603 Nail dystrophy: Secondary | ICD-10-CM | POA: Diagnosis not present

## 2024-02-09 DIAGNOSIS — Z79899 Other long term (current) drug therapy: Secondary | ICD-10-CM | POA: Diagnosis not present

## 2024-02-09 MED ORDER — TERBINAFINE HCL 250 MG PO TABS
250.0000 mg | ORAL_TABLET | Freq: Every day | ORAL | 0 refills | Status: AC
  Filled 2024-02-09 – 2024-02-28 (×2): qty 30, 30d supply, fill #0

## 2024-02-09 NOTE — Progress Notes (Signed)
 He was brought to presents today for follow-up of nail fungus.  She completed 6 laser treatments and feels that they are starting to look better that she has recently had a Kerlix from them so she says were not labeled with them today.  She states that she has been taking every other day dose of her medication and is doing fine with that medication plan.  She would like to continue doing that.  Objective: Vitals.  Oriented x 3 nails look fine she is alcoholic, but cannot see the old nail at all.  Assessment: According to patient no problems taking medication well-healing nails to slightly grow out.  Plan: Continue the use of every other day medication follow-up with her in about 4 months.  Dispense 30 tablets.
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
## 2024-02-24 ENCOUNTER — Other Ambulatory Visit: Payer: Self-pay

## 2024-02-28 ENCOUNTER — Other Ambulatory Visit: Payer: Self-pay

## 2024-02-28 ENCOUNTER — Ambulatory Visit

## 2024-02-28 DIAGNOSIS — M542 Cervicalgia: Secondary | ICD-10-CM

## 2024-02-28 DIAGNOSIS — M546 Pain in thoracic spine: Secondary | ICD-10-CM
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
## 2024-02-29 ENCOUNTER — Other Ambulatory Visit: Payer: Self-pay

## 2024-03-01 ENCOUNTER — Other Ambulatory Visit: Payer: Self-pay

## 2024-03-01 ENCOUNTER — Emergency Department

## 2024-03-01 ENCOUNTER — Emergency Department
Admission: EM | Admit: 2024-03-01 | Discharge: 2024-03-01 | Disposition: A | Discharge status: Home / Self Care | Attending: Emergency Medicine | Admitting: Emergency Medicine

## 2024-03-01 ENCOUNTER — Encounter: Payer: Self-pay | Admitting: Emergency Medicine

## 2024-03-01 DIAGNOSIS — Z85828 Personal history of other malignant neoplasm of skin: Secondary | ICD-10-CM | POA: Diagnosis not present

## 2024-03-01 DIAGNOSIS — Z7982 Long term (current) use of aspirin: Secondary | ICD-10-CM | POA: Insufficient documentation

## 2024-03-01 DIAGNOSIS — S0101XA Laceration without foreign body of scalp, initial encounter: Secondary | ICD-10-CM | POA: Diagnosis not present

## 2024-03-01 DIAGNOSIS — I10 Essential (primary) hypertension: Secondary | ICD-10-CM | POA: Diagnosis not present

## 2024-03-01 DIAGNOSIS — S63502A Unspecified sprain of left wrist, initial encounter: Secondary | ICD-10-CM | POA: Diagnosis not present

## 2024-03-01 DIAGNOSIS — W19XXXA Unspecified fall, initial encounter: Secondary | ICD-10-CM

## 2024-03-01 DIAGNOSIS — Z23 Encounter for immunization: Secondary | ICD-10-CM | POA: Insufficient documentation

## 2024-03-01 DIAGNOSIS — S0990XA Unspecified injury of head, initial encounter: Secondary | ICD-10-CM | POA: Diagnosis present

## 2024-03-01 DIAGNOSIS — W01198A Fall on same level from slipping, tripping and stumbling with subsequent striking against other object, initial encounter: Secondary | ICD-10-CM | POA: Insufficient documentation

## 2024-03-01 DIAGNOSIS — S61512A Laceration without foreign body of left wrist, initial encounter: Secondary | ICD-10-CM | POA: Diagnosis not present

## 2024-03-01 DIAGNOSIS — M25532 Pain in left wrist: Secondary | ICD-10-CM | POA: Diagnosis not present

## 2024-03-01 MED ORDER — LIDOCAINE-EPINEPHRINE (PF) 2 %-1:200000 IJ SOLN
10.0000 mL | Freq: Once | INTRAMUSCULAR | Status: AC
  Administered 2024-03-01: 10 mL
  Filled 2024-03-01: qty 20

## 2024-03-01 MED ORDER — OXYCODONE-ACETAMINOPHEN 5-325 MG PO TABS
1.0000 | ORAL_TABLET | Freq: Once | ORAL | Status: AC
  Administered 2024-03-01: 1 via ORAL
  Filled 2024-03-01: qty 1

## 2024-03-01 MED ORDER — TETANUS-DIPHTH-ACELL PERTUSSIS 5-2.5-18.5 LF-MCG/0.5 IM SUSY
0.5000 mL | PREFILLED_SYRINGE | Freq: Once | INTRAMUSCULAR | Status: AC
Start: 2024-03-01 — End: 2024-03-01
  Administered 2024-03-01: 0.5 mL via INTRAMUSCULAR
  Filled 2024-03-01: qty 0.5
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
## 2024-03-01 NOTE — Addendum Note (Signed)
 Addended by: Vania Rosero E on: 03/01/2024 09:25 PM   Modules accepted: Orders

## 2024-03-20 DIAGNOSIS — N200 Calculus of kidney: Secondary | ICD-10-CM | POA: Diagnosis not present

## 2024-03-20 DIAGNOSIS — N390 Urinary tract infection, site not specified: Secondary | ICD-10-CM | POA: Diagnosis not present

## 2024-03-20 DIAGNOSIS — B961 Klebsiella pneumoniae [K. pneumoniae] as the cause of diseases classified elsewhere: Secondary | ICD-10-CM | POA: Diagnosis not present

## 2024-03-24 ENCOUNTER — Ambulatory Visit (INDEPENDENT_AMBULATORY_CARE_PROVIDER_SITE_OTHER): Admitting: Nurse Practitioner

## 2024-03-31 ENCOUNTER — Other Ambulatory Visit: Payer: Self-pay | Admitting: Family Medicine

## 2024-03-31 DIAGNOSIS — Z1231 Encounter for screening mammogram for malignant neoplasm of breast: Secondary | ICD-10-CM

## 2024-04-03 ENCOUNTER — Ambulatory Visit

## 2024-04-04 ENCOUNTER — Ambulatory Visit: Attending: Orthopedic Surgery

## 2024-04-04 DIAGNOSIS — M546 Pain in thoracic spine: Secondary | ICD-10-CM | POA: Diagnosis not present

## 2024-04-04 DIAGNOSIS — M542 Cervicalgia: Secondary | ICD-10-CM | POA: Diagnosis not present
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
## 2024-04-10 ENCOUNTER — Ambulatory Visit: Payer: Self-pay | Admitting: Physician Assistant

## 2024-04-10 ENCOUNTER — Ambulatory Visit

## 2024-04-10 ENCOUNTER — Ambulatory Visit
Admission: EM | Admit: 2024-04-10 | Discharge: 2024-04-10 | Disposition: A | Discharge status: Home / Self Care | Attending: Physician Assistant | Admitting: Physician Assistant

## 2024-04-10 DIAGNOSIS — A045 Campylobacter enteritis: Secondary | ICD-10-CM | POA: Diagnosis present

## 2024-04-10 DIAGNOSIS — A084 Viral intestinal infection, unspecified: Secondary | ICD-10-CM

## 2024-04-10 DIAGNOSIS — R109 Unspecified abdominal pain: Secondary | ICD-10-CM | POA: Insufficient documentation

## 2024-04-10 DIAGNOSIS — R110 Nausea: Secondary | ICD-10-CM | POA: Diagnosis not present

## 2024-04-10 DIAGNOSIS — A059 Bacterial foodborne intoxication, unspecified: Secondary | ICD-10-CM | POA: Insufficient documentation

## 2024-04-10 DIAGNOSIS — R197 Diarrhea, unspecified: Secondary | ICD-10-CM | POA: Insufficient documentation

## 2024-04-10 LAB — CBC WITH DIFFERENTIAL/PLATELET
Abs Immature Granulocytes: 0.01 K/uL (ref 0.00–0.07)
Basophils Absolute: 0.1 K/uL (ref 0.0–0.1)
Basophils Relative: 1 %
Eosinophils Absolute: 0.1 K/uL (ref 0.0–0.5)
Eosinophils Relative: 1 %
HCT: 40.9 % (ref 36.0–46.0)
Hemoglobin: 13.9 g/dL (ref 12.0–15.0)
Immature Granulocytes: 0 %
Lymphocytes Relative: 13 %
Lymphs Abs: 1.1 K/uL (ref 0.7–4.0)
MCH: 29.1 pg (ref 26.0–34.0)
MCHC: 34 g/dL (ref 30.0–36.0)
MCV: 85.7 fL (ref 80.0–100.0)
Monocytes Absolute: 1 K/uL (ref 0.1–1.0)
Monocytes Relative: 12 %
Neutro Abs: 6 K/uL (ref 1.7–7.7)
Neutrophils Relative %: 73 %
Platelets: 295 K/uL (ref 150–400)
RBC: 4.77 MIL/uL (ref 3.87–5.11)
RDW: 12.4 % (ref 11.5–15.5)
WBC: 8.2 K/uL (ref 4.0–10.5)
nRBC: 0 % (ref 0.0–0.2)

## 2024-04-10 LAB — COMPREHENSIVE METABOLIC PANEL WITH GFR
ALT: 17 U/L (ref 0–44)
AST: 27 U/L (ref 15–41)
Albumin: 3.6 g/dL (ref 3.5–5.0)
Alkaline Phosphatase: 104 U/L (ref 38–126)
Anion gap: 11 (ref 5–15)
BUN: 16 mg/dL (ref 6–20)
CO2: 23 mmol/L (ref 22–32)
Calcium: 8.8 mg/dL — ABNORMAL LOW (ref 8.9–10.3)
Chloride: 101 mmol/L (ref 98–111)
Creatinine, Ser: 0.82 mg/dL (ref 0.44–1.00)
GFR, Estimated: >60 mL/min (ref >60)
Glucose, Bld: 92 mg/dL (ref 70–99)
Potassium: 3.5 mmol/L (ref 3.5–5.1)
Sodium: 135 mmol/L (ref 135–145)
Total Bilirubin: 0.9 mg/dL (ref 0.0–1.2)
Total Protein: 6.9 g/dL (ref 6.5–8.1)

## 2024-04-10 LAB — GASTROINTESTINAL PANEL BY PCR, STOOL (REPLACES STOOL CULTURE)

## 2024-04-10 LAB — C DIFFICILE QUICK SCREEN W PCR REFLEX
C Diff antigen: NEGATIVE
C Diff interpretation: NOT DETECTED
C Diff toxin: NEGATIVE

## 2024-04-10 MED ORDER — AZITHROMYCIN 500 MG PO TABS: 500.0000 mg | ORAL_TABLET | Freq: Every day | ORAL | 0 refills | Status: AC

## 2024-04-10 NOTE — ED Triage Notes (Signed)
 Sx since Friday  Nausea and diarrhea. Can't eat anything or drink.

## 2024-04-10 NOTE — ED Provider Notes (Addendum)
 [facility]    CSN: [id number] Arrival date & time: 04/10/24  1106      History   Chief Complaint Chief Complaint  Patient presents with   Abdominal Pain   Nausea   Diarrhea    HPI Elizabeth Good is a 58 y.o. female presenting for 3-day history of nausea and diarrhea.  Also reports fatigue and mild generalized abdominal cramping.  Temp up to 100.6 degrees yesterday but no fever today. No vomiting. No black or bloody stool. No cough, congestion, sore throat, chest pain, shortness of breath.  No dysuria, urgency or frequency. No sick contacts.  Reports travel to [place]  3 weeks ago.  Treated 3 weeks ago with doxycycline  for UTI per patient. Has been drinking a lot of Gatorade by believes she is dehydrated.   Medical history significant for kidney stones, GERD, chronic cystitis, seizures, gastric bypass, bladder and rectal surgery.  HPI  Past Medical History:  Diagnosis Date   Allergy    Anemia    Bartholin cyst 1990   Basal cell carcinoma    Curvature of spine    Family history of adverse reaction to anesthesia    Mother - PONV   GERD (gastroesophageal reflux disease)    Gestational diabetes 08/11/2022   High cholesterol    resolved   History of bladder infections    History of hiatal hernia    History of kidney infection    History of kidney stones    h/o   Hypertension    resolved. BP fine at home and MD instructred her to stop BP med   Kidney stones    Low serum iron     Meningitis    Age 49   PONV (postoperative nausea and vomiting)    hard to wake up   Renal disorder    Scoliosis    Seizures (HCC)    none since 05/1997.  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   GERD (gastroesophageal reflux disease) 09/28/2018   Seizures (HCC) 09/28/2018   Personal history of kidney stones 06/23/2018   Microhematuria 06/23/2018   Dysuria 06/23/2018   Calcium, urinary 06/16/2018   Varicose veins of both lower extremities with pain 02/11/2018   Chronic venous insufficiency 02/11/2018   Iron  deficiency anemia 03/25/2017   H/O gastric bypass 03/25/2017   Uterus, adenomyosis 03/08/2017   Hamartoma (HCC) 03/11/2016   High cholesterol 02/25/2016   Chronic cystitis 08/14/2013   Kidney stone 08/14/2013   Renal colic 08/14/2013    Past Surgical History:  Procedure Laterality Date   ANTERIOR AND POSTERIOR VAGINAL REPAIR     [facility]   BIOPSY N/A 08/08/2019   Procedure: UTERINE SEROSA BIOPSIES;  Surgeon: Lake Read, MD;  Location: [facility];  Service: Gynecology;  Laterality: N/A;   BLADDER SURGERY     BREAST BIOPSY Left 06/29/2019   left breast stereo bx/ x clip/FOCAL ATYPICAL DUCT HYPERPLASIA WITH MICROCALCIFICATIONS.    BREAST BIOPSY Left 07/14/2019   left breast stereo bx/ coil clip/ neg   BREAST BIOPSY Left 08/11/2019   left breast stereo at [facility]/Atypical  lobular hyperplasia (ALH), fibroadenomatoid change with sclerosing adenosis, and focal pseudoangiomatous stromal hyperplasia (PASH) with associated microcalcifications.   BREAST EXCISIONAL BIOPSY Left 09/21/2019   Atypical ductal hyperplasia of left breast   CHOLECYSTECTOMY  2011   COLONOSCOPY WITH PROPOFOL  N/A 02/07/2021   Procedure: COLONOSCOPY WITH BIOPSY;  Surgeon: Jinny Carmine, MD;  Location: [facility];  Service: Endoscopy;  Laterality: N/A;   COMBINED HYSTEROSCOPY DIAGNOSTIC / D&C  12/06/2014   [facility]   ENDOMETRIAL ABLATION  2011   [facility]  Dr. Janit   EXTRACORPOREAL SHOCK WAVE LITHOTRIPSY Right 08/25/2018   Procedure:  EXTRACORPOREAL SHOCK WAVE LITHOTRIPSY (ESWL);  Surgeon: Francisca Redell BROCKS, MD;  Location: [facility];  Service: Urology;  Laterality: Right;   EXTRACORPOREAL SHOCK WAVE LITHOTRIPSY Right 01/21/2024   Procedure: LITHOTRIPSY, ESWL;  Surgeon: Cam Morene ORN, MD;  Location: [facility];  Service: Urology;  Laterality: Right;  RIGHT EXTRACORPOREAL SHOCKWAVE LITHOTRIPSY   FOOT SURGERY Left    GANGLION CYST EXCISION     GASTRIC BYPASS  2011   INCONTINENCE SURGERY     LAPAROSCOPIC BILATERAL SALPINGECTOMY  08/08/2019   Procedure: LAPAROSCOPIC BILATERAL SALPINGECTOMY;  Surgeon: Lake Read, MD;  Location: [facility];  Service: Gynecology;;   LITHOTRIPSY  2016   OOPHORECTOMY Left 2020   POLYPECTOMY N/A 02/07/2021   Procedure: POLYPECTOMY;  Surgeon: Jinny Carmine, MD;  Location: [facility];  Service: Endoscopy;  Laterality: N/A;   RECTAL SURGERY     SEPTOPLASTY     TONSILLECTOMY      OB History     Gravida  2   Para  2   Term      Preterm      AB      Living  2      SAB      IAB      Ectopic      Multiple      Live Births               Home Medications    Prior to Admission medications   Medication Sig Start Date End Date Taking? Authorizing Provider  cetirizine (ZYRTEC) 10 MG tablet Take 10 mg by mouth at bedtime.    Yes [provider]  Cholecalciferol (VITAMIN D3) 5000 units TABS Take 5,000 Units by mouth every 7 (seven) days. 10/12/11  Yes [provider]  cyanocobalamin  (VITAMIN B12) 1000 MCG tablet Take 1,000 mcg by mouth daily. 04/13/22  Yes [provider]  LamoTRIgine  200 MG TB24 24 hour tablet Take 1 tablet (200 mg total) by mouth 2 (two) times daily. Dr. Jess brand only. 04/19/23  Yes   pantoprazole  (PROTONIX ) 40 MG tablet Take 1 tablet (40 mg total) by mouth daily. 04/19/23  Yes   Semaglutide -Weight Management (WEGOVY ) 1 MG/0.5ML SOAJ Inject 1 mg into the skin once a week. 11/06/22  Yes   acetaminophen  (TYLENOL ) 500 MG tablet  Take 1,000 mg by mouth 2 (two) times daily as needed.    [provider]  gabapentin  (NEURONTIN ) 100 MG capsule Take 1 capsule (100 mg total) by mouth at bedtime. 11/29/23     ibuprofen  (ADVIL ) 800 MG tablet Take 800 mg by mouth every 8 (eight) hours as needed.    [provider]  Multiple Vitamin (MULTIVITAMIN) tablet Take 1 tablet by mouth daily.    [provider]  ondansetron  (ZOFRAN ) 4 MG tablet Take 1 tablet (4 mg total) by mouth every 8 (eight) hours as needed for nausea or vomiting.  01/21/24   Cam Morene ORN, MD  terbinafine  (LAMISIL ) 250 MG tablet Take 1 tablet (250 mg total) by mouth daily. 02/09/24   Hyatt, Max T, DPM  traMADol  (ULTRAM ) 50 MG tablet Take 1-2 tablets (50-100 mg total) by mouth every 6 (six) hours as needed for moderate pain (pain score 4-6). 01/21/24   Cam Morene ORN, MD  Urelle  (URELLE ALMER) 81 MG TABS tablet Take 1 tablet (81 mg total) by mouth 4 (four) times daily as needed. 01/13/24       Family History Family History  Problem Relation Age of Onset   Bladder Cancer Mother    Prostate cancer Father    Melanoma Father    Breast cancer Cousin     Social History Social History   Tobacco Use   Smoking status: Never   Smokeless tobacco: Never  Vaping Use   Vaping status: Never Used  Substance Use Topics   Alcohol use: Yes    Alcohol/week: 0.0 standard drinks of alcohol    Comment: social - 1 glass wine/month   Drug use: No     Allergies   Dermabase [albolene], Other, Latex, Penicillins, and Sulfa antibiotics   Review of Systems Review of Systems  Constitutional:  Positive for appetite change and fatigue. Negative for chills, diaphoresis and fever.  HENT:  Negative for congestion, ear pain, rhinorrhea and sore throat.   Respiratory:  Negative for cough and shortness of breath.   Cardiovascular:  Negative for chest pain.  Gastrointestinal:  Positive for abdominal pain, diarrhea and nausea. Negative for vomiting.   Musculoskeletal:  Negative for myalgias.  Skin:  Negative for rash.  Neurological:  Negative for weakness and headaches.  Hematological:  Negative for adenopathy.     Physical Exam Triage Vital Signs ED Triage Vitals  Encounter Vitals Group     BP 04/10/24 1127 126/79     Girls Systolic BP Percentile --      Girls Diastolic BP Percentile --      Boys Systolic BP Percentile --      Boys Diastolic BP Percentile --      Pulse Rate 04/10/24 1127 94     Resp 04/10/24 1127 17     Temp 04/10/24 1127 98.6 F (37 C)     Temp Source 04/10/24 1127 Oral     SpO2 04/10/24 1127 99 %     Weight 04/10/24 1126 161 lb (73 kg)     Height --      Head Circumference --      Peak Flow --      Pain Score 04/10/24 1126 2     Pain Loc --      Pain Education --      Exclude from Growth Chart --    No data found.  Updated Vital Signs BP 126/79 (BP Location: Left Arm)   Pulse 94   Temp 98.6 F (37 C) (Oral)   Resp 17   Wt 161 lb (73 kg)   LMP 11/29/2017 (Approximate)   SpO2 99%   BMI 27.64 kg/m      Physical Exam Vitals and nursing note reviewed.  Constitutional:      General: She is not in acute distress.    Appearance: Normal appearance. She is not ill-appearing or toxic-appearing.  HENT:     Head: Normocephalic and atraumatic.     Mouth/Throat:     Mouth: Mucous membranes are moist.     Pharynx: Oropharynx is clear.  Eyes:  General: No scleral icterus.       Right eye: No discharge.        Left eye: No discharge.     Conjunctiva/sclera: Conjunctivae normal.  Cardiovascular:     Rate and Rhythm: Normal rate and regular rhythm.     Heart sounds: Normal heart sounds.  Pulmonary:     Effort: Pulmonary effort is normal. No respiratory distress.     Breath sounds: Normal breath sounds.  Abdominal:     Palpations: Abdomen is soft.     Tenderness: There is abdominal tenderness (mild generalized). There is no guarding or rebound.  Musculoskeletal:     Cervical back: Neck  supple.  Skin:    General: Skin is dry.  Neurological:     General: No focal deficit present.     Mental Status: She is alert. Mental status is at baseline.     Motor: No weakness.     Gait: Gait normal.  Psychiatric:        Mood and Affect: Mood normal.        Behavior: Behavior normal.      UC Treatments / Results  Labs (all labs ordered are listed, but only abnormal results are displayed) Labs Reviewed  GASTROINTESTINAL PANEL BY PCR, STOOL (REPLACES STOOL CULTURE) - Abnormal; Notable for the following components:      Result Value   Campylobacter species DETECTED (*)    All other components within normal limits  COMPREHENSIVE METABOLIC PANEL WITH GFR - Abnormal; Notable for the following components:   Calcium 8.8 (*)    All other components within normal limits  C DIFFICILE QUICK SCREEN W PCR REFLEX    CBC WITH DIFFERENTIAL/PLATELET    EKG   Radiology No results found.  Procedures Procedures (including critical care time)  Medications Ordered in UC Medications - No data to display  Initial Impression / Assessment and Plan / UC Course  I have reviewed the triage vital signs and the nursing notes.  Pertinent labs & imaging results that were available during my care of the patient were reviewed by me and considered in my medical decision making (see chart for details).   58 year old female presents for fatigue, generalized abdominal cramping, nausea without vomiting and diarrhea x 3 days.  No reported sick contacts.  Not taking any OTC meds.  Reports drinking a lot of Gatorade to stay hydrated.  Patient has had too many episodes of diarrhea to count and says every time she eats something she has diarrhea.  Vitals are all stable and normal and patient is overall well-appearing.  No acute distress.  On exam has mild generalized abdominal tenderness without guarding or rebound.  CBC and CMP obtained.  Normal CBC and CMP without significant findings. Normal  electrolytes, BUN, GFR and creatinine.   Food poisoning vs viral gastroenteritis.  Reviewed all labs with patient. Patient requests to have stool studies. Collected c diff and GI panel. Will call if positive. Supportive care at this time with increasing rest and fluids. Patient reports she will take promethazine  at home as needed.  Reviewed return and ED precautions. Work note given. May amend treatment plan based on lab results.  Update: Negative C. Diff. Positive campylobacter. Discussed results with patient. Given medical history, will treat with 3 day course of azithromycin . Advised against Imodium and just continue hydrating well. ED if weakness, fever, worsening abdominal pain.   Final Clinical Impressions(s) / UC Diagnoses   Final diagnoses:  Diarrhea, unspecified type  Nausea  Campylobacter diarrhea  Abdominal cramping  Food poisoning     Discharge Instructions      -Labs are perfect. You are well hydrated. Continue to drink the gatorade. -Take home promethazine  and start Imodium if having more than 6 episodes or diarrhea per day.  ABDOMINAL PAIN: You may take Tylenol  for pain relief. Use medications as directed including antiemetics and antidiarrheal medications if suggested or prescribed. You should increase fluids and electrolytes as well as rest over these next several days. If you have any questions or concerns, or if your symptoms are not improving or if especially if they acutely worsen, please call or stop back to the clinic immediately and we will be happy to help you or go to the ER   ABDOMINAL PAIN RED FLAGS: Seek immediate further care if: symptoms remain the same or worsen over the next 3-7 days, you are unable to keep fluids down, you see blood or mucus in your stool, you vomit black or dark red material, you have a fever of 101.F or higher, you have localized and/or persistent abdominal pain       ED Prescriptions   None    PDMP not reviewed this  encounter.   Arvis Jolan NOVAK, PA-C 04/10/24 1308    Arvis Jolan B, PA-C 04/10/24 1845

## 2024-04-10 NOTE — Discharge Instructions (Signed)
-  Labs are perfect. You are well hydrated. Continue to drink the gatorade. -Take home promethazine  and start Imodium if having more than 6 episodes or diarrhea per day.  ABDOMINAL PAIN: You may take Tylenol  for pain relief. Use medications as directed including antiemetics and antidiarrheal medications if suggested or prescribed. You should increase fluids and electrolytes as well as rest over these next several days. If you have any questions or concerns, or if your symptoms are not improving or if especially if they acutely worsen, please call or stop back to the clinic immediately and we will be happy to help you or go to the ER   ABDOMINAL PAIN RED FLAGS: Seek immediate further care if: symptoms remain the same or worsen over the next 3-7 days, you are unable to keep fluids down, you see blood or mucus in your stool, you vomit black or dark red material, you have a fever of 101.F or higher, you have localized and/or persistent abdominal pain

## 2024-04-10 NOTE — Telephone Encounter (Signed)
 Positive Campylobacter.  Call discussed results with patient.  Sent azithromycin  500 mg daily x 3 days to pharmacy.

## 2024-04-11 ENCOUNTER — Encounter (HOSPITAL_COMMUNITY): Payer: Self-pay

## 2024-04-17 ENCOUNTER — Encounter

## 2024-04-20 ENCOUNTER — Encounter

## 2024-04-24 ENCOUNTER — Encounter

## 2024-04-24 DIAGNOSIS — K912 Postsurgical malabsorption, not elsewhere classified: Secondary | ICD-10-CM | POA: Diagnosis not present

## 2024-04-24 DIAGNOSIS — Z1331 Encounter for screening for depression: Secondary | ICD-10-CM | POA: Diagnosis not present

## 2024-04-24 DIAGNOSIS — Z79899 Other long term (current) drug therapy: Secondary | ICD-10-CM | POA: Diagnosis not present

## 2024-04-24 DIAGNOSIS — Z9884 Bariatric surgery status: Secondary | ICD-10-CM | POA: Diagnosis not present

## 2024-04-24 DIAGNOSIS — K9089 Other intestinal malabsorption: Secondary | ICD-10-CM | POA: Diagnosis not present

## 2024-04-24 DIAGNOSIS — Z0000 Encounter for general adult medical examination without abnormal findings: Secondary | ICD-10-CM | POA: Diagnosis not present

## 2024-04-24 DIAGNOSIS — R569 Unspecified convulsions: Secondary | ICD-10-CM | POA: Diagnosis not present

## 2024-04-24 DIAGNOSIS — G40909 Epilepsy, unspecified, not intractable, without status epilepticus: Secondary | ICD-10-CM | POA: Diagnosis not present

## 2024-04-24 DIAGNOSIS — Z1330 Encounter for screening examination for mental health and behavioral disorders, unspecified: Secondary | ICD-10-CM | POA: Diagnosis not present

## 2024-04-24 DIAGNOSIS — L820 Inflamed seborrheic keratosis: Secondary | ICD-10-CM | POA: Diagnosis not present

## 2024-05-02 ENCOUNTER — Ambulatory Visit

## 2024-05-02 ENCOUNTER — Other Ambulatory Visit: Payer: Self-pay

## 2024-05-02 LAB — MISCELLANEOUS TEST

## 2024-05-02 MED ORDER — TRIAMTERENE-HCTZ 37.5-25 MG PO CAPS
1.0000 | ORAL_CAPSULE | Freq: Every day | ORAL | 0 refills | Status: AC
  Filled 2024-05-02 – 2024-05-18 (×2): qty 30, 30d supply, fill #0

## 2024-05-02 MED ORDER — PANTOPRAZOLE SODIUM 40 MG PO TBEC
40.0000 mg | DELAYED_RELEASE_TABLET | Freq: Every day | ORAL | 3 refills | Status: AC
  Filled 2024-05-02 – 2024-05-22 (×3): qty 90, 90d supply, fill #0
  Filled 2024-08-17: qty 90, 90d supply, fill #1

## 2024-05-02 MED ORDER — CLOTRIMAZOLE-BETAMETHASONE 1-0.05 % EX CREA
TOPICAL_CREAM | Freq: Two times a day (BID) | CUTANEOUS | 0 refills | Status: AC
  Filled 2024-05-02 – 2024-05-22 (×2): qty 15, 7d supply, fill #0

## 2024-05-02 MED ORDER — LAMOTRIGINE ER 200 MG PO TB24
200.0000 mg | ORAL_TABLET | Freq: Two times a day (BID) | ORAL | 3 refills | Status: AC
  Filled 2024-05-02 – 2024-05-22 (×3): qty 180, 90d supply, fill #0
  Filled 2024-08-17: qty 180, 90d supply, fill #1

## 2024-05-03 ENCOUNTER — Encounter

## 2024-05-03 ENCOUNTER — Ambulatory Visit
Admission: EM | Admit: 2024-05-03 | Discharge: 2024-05-03 | Disposition: A | Discharge status: Home / Self Care | Attending: Nurse Practitioner | Admitting: Nurse Practitioner

## 2024-05-03 DIAGNOSIS — R21 Rash and other nonspecific skin eruption: Secondary | ICD-10-CM

## 2024-05-03 MED ORDER — DEXAMETHASONE SODIUM PHOSPHATE 10 MG/ML IJ SOLN
10.0000 mg | Freq: Once | INTRAMUSCULAR | Status: AC
  Administered 2024-05-03: 10 mg via INTRAMUSCULAR

## 2024-05-03 MED ORDER — PREDNISONE 10 MG (21) PO TBPK: ORAL_TABLET | Freq: Every day | ORAL | 0 refills | Status: AC

## 2024-05-03 MED ORDER — TRIAMCINOLONE ACETONIDE 0.1 % EX CREA: 1.0000 | TOPICAL_CREAM | Freq: Two times a day (BID) | CUTANEOUS | 0 refills | Status: AC

## 2024-05-03 NOTE — ED Provider Notes (Signed)
 [facility]    CSN: [id number] Arrival date & time: 05/03/24  0806      History   Chief Complaint Chief Complaint  Patient presents with   Rash    HPI Elizabeth Good is a 58 y.o. female.   Discussed the use of AI scribe software for clinical note transcription with the patient, who gave verbal consent to proceed.   The patient presents with an itchy rash that developed after spending time at a pool on Sunday. The rash began Monday night with redness and itching, primarily affecting the upper chest and some on the anterior thighs where shorts were worn. The patient reports the rash has not spread but continues to be intensely itchy, disrupting sleep. The patient denies any rash on the belly, back, arms, or other areas of the legs. The rash appears red due to scratching. The patient has not experienced a similar reaction before. The patient denies any changes in soap, lotion, or detergent that might have triggered the reaction. They applied sunscreen before pool exposure, but note that areas typically covered by sunscreen (arms and legs) are not uniformly affected. Cool compresses, benadryl  spray, oral benadryl  and hydrocortisone  cream have been attempted for relief without significant improvement.  The following portions of the patient's history were reviewed and updated as appropriate: allergies, current medications, past family history, past medical history, past social history, past surgical history, and problem list.     Past Medical History:  Diagnosis Date   Allergy    Anemia    Bartholin cyst 1990   Basal cell carcinoma    Curvature of spine    Family history of adverse reaction to anesthesia    Mother - PONV   GERD (gastroesophageal reflux disease)    Gestational diabetes 08/11/2022   High cholesterol    resolved   History of bladder infections    History of hiatal hernia    History of kidney infection    History of kidney stones    h/o    Hypertension    resolved. BP fine at home and MD instructred her to stop BP med   Kidney stones    Low serum iron     Meningitis    Age 82   PONV (postoperative nausea and vomiting)    hard to wake up   Renal disorder    Scoliosis    Seizures (HCC)    none since 05/1997.  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   GERD (gastroesophageal reflux disease) 09/28/2018   Seizures (HCC) 09/28/2018   Personal history of kidney stones 06/23/2018   Microhematuria 06/23/2018   Dysuria 06/23/2018   Calcium, urinary 06/16/2018   Varicose veins of both lower extremities with pain 02/11/2018   Chronic venous insufficiency 02/11/2018   Iron  deficiency anemia 03/25/2017   H/O gastric bypass 03/25/2017   Uterus, adenomyosis 03/08/2017   Hamartoma (HCC) 03/11/2016   High cholesterol 02/25/2016   Chronic cystitis 08/14/2013   Kidney stone 08/14/2013   Renal colic 08/14/2013    Past Surgical History:  Procedure Laterality Date   ANTERIOR  AND POSTERIOR VAGINAL REPAIR     [facility]   BIOPSY N/A 08/08/2019   Procedure: UTERINE SEROSA BIOPSIES;  Surgeon: Lake Read, MD;  Location: [facility];  Service: Gynecology;  Laterality: N/A;   BLADDER SURGERY     BREAST BIOPSY Left 06/29/2019   left breast stereo bx/ x clip/FOCAL ATYPICAL DUCT HYPERPLASIA WITH MICROCALCIFICATIONS.    BREAST BIOPSY Left 07/14/2019   left breast stereo bx/ coil clip/ neg   BREAST BIOPSY Left 08/11/2019   left breast stereo at [facility]/Atypical lobular hyperplasia [facility]), fibroadenomatoid change with  sclerosing adenosis, and focal pseudoangiomatous stromal hyperplasia (PASH) with associated microcalcifications.   BREAST EXCISIONAL BIOPSY Left 09/21/2019   Atypical ductal hyperplasia of left breast   CHOLECYSTECTOMY  2011   COLONOSCOPY WITH PROPOFOL  N/A 02/07/2021   Procedure: COLONOSCOPY WITH BIOPSY;  Surgeon: Jinny Carmine, MD;  Location: [facility];  Service: Endoscopy;  Laterality: N/A;   COMBINED HYSTEROSCOPY DIAGNOSTIC / D&C  12/06/2014   [facility]   ENDOMETRIAL ABLATION  2011   [facility]  Dr. Janit   EXTRACORPOREAL SHOCK WAVE LITHOTRIPSY Right 08/25/2018   Procedure: EXTRACORPOREAL SHOCK WAVE LITHOTRIPSY (ESWL);  Surgeon: Francisca Redell BROCKS, MD;  Location: [facility];  Service: Urology;  Laterality: Right;   EXTRACORPOREAL SHOCK WAVE LITHOTRIPSY Right 01/21/2024   Procedure: LITHOTRIPSY, ESWL;  Surgeon: Cam Morene ORN, MD;  Location: [facility];  Service: Urology;  Laterality: Right;  RIGHT EXTRACORPOREAL SHOCKWAVE LITHOTRIPSY   FOOT SURGERY Left    GANGLION CYST EXCISION     GASTRIC BYPASS  2011   INCONTINENCE SURGERY     LAPAROSCOPIC BILATERAL SALPINGECTOMY  08/08/2019   Procedure: LAPAROSCOPIC BILATERAL SALPINGECTOMY;  Surgeon: Lake Read, MD;  Location: [facility];  Service: Gynecology;;   LITHOTRIPSY  2016   OOPHORECTOMY Left 2020   POLYPECTOMY N/A 02/07/2021   Procedure: POLYPECTOMY;  Surgeon: Jinny Carmine, MD;  Location: [facility];  Service: Endoscopy;  Laterality: N/A;   RECTAL SURGERY     SEPTOPLASTY     TONSILLECTOMY      OB History     Gravida  2   Para  2   Term      Preterm      AB      Living  2      SAB      IAB      Ectopic      Multiple      Live Births               Home Medications    Prior to Admission medications   Medication Sig Start Date End Date Taking? Authorizing Provider  LamoTRIgine  200 MG TB24 24 hour tablet Take 1 tablet (200 mg total) by mouth 2 (two) times daily. 05/02/24  Yes    pantoprazole  (PROTONIX ) 40 MG tablet Take 1 tablet (40 mg total) by mouth daily. 05/02/24  Yes   predniSONE  (STERAPRED UNI-PAK 21 TAB) 10 MG (21) TBPK tablet Take by mouth daily. Take 6 tabs by mouth daily  for 2 days, then 5 tabs for 2 days, then 4 tabs for 2 days, then 3 tabs for 2 days, 2 tabs for 2 days, then 1 tab by mouth daily for 2 days 05/03/24  Yes Iola Lukes, FNP  triamcinolone  cream (KENALOG ) 0.1 % Apply 1 Application topically 2 (two) times daily. Apply to affected areas two times a day until symptoms resolve for up to 2 weeks 05/03/24  Yes Iola Lukes, FNP  acetaminophen  (TYLENOL )  500 MG tablet Take 1,000 mg by mouth 2 (two) times daily as needed.    [provider]  cetirizine (ZYRTEC) 10 MG tablet Take 10 mg by mouth at bedtime.     [provider]  Cholecalciferol (VITAMIN D3) 5000 units TABS Take 5,000 Units by mouth every 7 (seven) days. 10/12/11   [provider]  clotrimazole -betamethasone  (LOTRISONE ) cream Apply topically 2 (two) times daily 05/02/24     cyanocobalamin  (VITAMIN B12) 1000 MCG tablet Take 1,000 mcg by mouth daily. 04/13/22   [provider]  gabapentin  (NEURONTIN ) 100 MG capsule Take 1 capsule (100 mg total) by mouth at bedtime. 11/29/23     ibuprofen  (ADVIL ) 800 MG tablet Take 800 mg by mouth every 8 (eight) hours as needed.    [provider]  Multiple Vitamin (MULTIVITAMIN) tablet Take 1 tablet by mouth daily.    [provider]  ondansetron  (ZOFRAN ) 4 MG tablet Take 1 tablet (4 mg total) by mouth every 8 (eight) hours as needed for nausea or vomiting. 01/21/24   Cam Morene ORN, MD  Semaglutide -Weight Management (WEGOVY ) 1 MG/0.5ML SOAJ Inject 1 mg into the skin once a week. 11/06/22     terbinafine  (LAMISIL ) 250 MG tablet Take 1 tablet (250 mg total) by mouth daily. 02/09/24   Hyatt, Max T, DPM  traMADol  (ULTRAM ) 50 MG tablet Take 1-2 tablets (50-100 mg total) by mouth every 6 (six) hours as needed for  moderate pain (pain score 4-6). 01/21/24   Cam Morene ORN, MD  triamterene -hydrochlorothiazide  (DYAZIDE ) 37.5-25 MG capsule Take 1 each (1 capsule total) by mouth daily if BP over 140/90. 05/02/24     Urelle  (URELLE /URISED) 81 MG TABS tablet Take 1 tablet (81 mg total) by mouth 4 (four) times daily as needed. 01/13/24       Family History Family History  Problem Relation Age of Onset   Bladder Cancer Mother    Prostate cancer Father    Melanoma Father    Breast cancer Cousin     Social History Social History   Tobacco Use   Smoking status: Never   Smokeless tobacco: Never  Vaping Use   Vaping status: Never Used  Substance Use Topics   Alcohol use: Yes    Alcohol/week: 0.0 standard drinks of alcohol    Comment: social - 1 glass wine/month   Drug use: No     Allergies   Dermabase [albolene], Other, Latex, Penicillins, and Sulfa antibiotics   Review of Systems Review of Systems  Constitutional:  Negative for fever.  Skin:  Positive for rash.  All other systems reviewed and are negative.    Physical Exam Triage Vital Signs ED Triage Vitals  Encounter Vitals Group     BP 05/03/24 0825 (!) 147/90     Girls Systolic BP Percentile --      Girls Diastolic BP Percentile --      Boys Systolic BP Percentile --      Boys Diastolic BP Percentile --      Pulse Rate 05/03/24 0825 79     Resp 05/03/24 0825 18     Temp 05/03/24 0825 98.8 F (37.1 C)     Temp Source 05/03/24 0825 Oral     SpO2 05/03/24 0825 97 %     Weight 05/03/24 0823 172 lb 3.2 oz (78.1 kg)     Height --      Head Circumference --      Peak Flow --  Pain Score 05/03/24 0824 0     Pain Loc --      Pain Education --      Exclude from Growth Chart --    No data found.  Updated Vital Signs BP (!) 147/90 (BP Location: Right Arm)   Pulse 79   Temp 98.8 F (37.1 C) (Oral)   Resp 18   Wt 172 lb 3.2 oz (78.1 kg)   LMP 11/29/2017 (Approximate)   SpO2 97%   BMI 29.56 kg/m   Visual  Acuity Right Eye Distance:   Left Eye Distance:   Bilateral Distance:    Right Eye Near:   Left Eye Near:    Bilateral Near:     Physical Exam Vitals reviewed.  Constitutional:      General: She is awake. She is not in acute distress.    Appearance: Normal appearance. She is well-developed. She is not ill-appearing, toxic-appearing or diaphoretic.  HENT:     Head: Normocephalic.     Right Ear: Hearing normal.     Left Ear: Hearing normal.     Nose: Nose normal.     Mouth/Throat:     Mouth: Mucous membranes are moist.  Eyes:     General: Vision grossly intact.     Conjunctiva/sclera: Conjunctivae normal.  Cardiovascular:     Rate and Rhythm: Normal rate and regular rhythm.     Heart sounds: Normal heart sounds.  Pulmonary:     Effort: Pulmonary effort is normal.     Breath sounds: Normal breath sounds and air entry.  Musculoskeletal:        General: Normal range of motion.     Cervical back: Full passive range of motion without pain, normal range of motion and neck supple.  Skin:    General: Skin is warm and dry.     Findings: Rash present.         Comments: There is an erythematous, scaly rash present on the anterior chest without associated swelling, drainage, or underlying fluctuance. Additionally, a mild linear erythematous rash is observed on the anterior thighs, which is less pronounced and significantly smaller in size compared to the rash on the chest.   Neurological:     General: No focal deficit present.     Mental Status: She is alert and oriented to person, place, and time.  Psychiatric:        Speech: Speech normal.        Behavior: Behavior is cooperative.      UC Treatments / Results  Labs (all labs ordered are listed, but only abnormal results are displayed) Labs Reviewed - No data to display  EKG   Radiology No results found.  Procedures Procedures (including critical care time)  Medications Ordered in UC Medications  dexamethasone   (DECADRON ) injection 10 mg (10 mg Intramuscular Given 05/03/24 0852)    Initial Impression / Assessment and Plan / UC Course  I have reviewed the triage vital signs and the nursing notes.  Pertinent labs & imaging results that were available during my care of the patient were reviewed by me and considered in my medical decision making (see chart for details).    The patient presents with a pruritic rash that developed after swimming in a pool, appearing the following evening and localized to the chest and anterior thighs. There is no involvement of the abdomen, back, or extremities, and the patient denies new exposures to soaps, lotions, or detergents. The rash is red, itchy, and worsened  by scratching, without associated systemic symptoms. Differential considerations include contact dermatitis, heat rash, or an atypical sun-related dermatitis. In-office treatment included an intramuscular corticosteroid injection. The patient was prescribed a short course of oral corticosteroids and a topical corticosteroid cream to be applied twice daily for up to two weeks or until symptoms improve. Over-the-counter diphenhydramine  50 mg at bedtime was recommended to help with itching and sleep. The patient was advised that symptoms should improve quickly with steroid therapy, though resolution of the rash may take several days. Follow-up with the primary care provider is advised if symptoms persist or worsen, and the patient was instructed to seek emergency care for any signs of severe allergic reaction, such as difficulty breathing, swelling of the lips or tongue, or widespread rash.  Today's evaluation has revealed no signs of a dangerous process. Discussed diagnosis with patient and/or guardian. Patient and/or guardian aware of their diagnosis, possible red flag symptoms to watch out for and need for close follow up. Patient and/or guardian understands verbal and written discharge instructions. Patient and/or  guardian comfortable with plan and disposition.  Patient and/or guardian has a clear mental status at this time, good insight into illness (after discussion and teaching) and has clear judgment to make decisions regarding their care  Documentation was completed with the aid of voice recognition software. Transcription may contain typographical errors.   Final Clinical Impressions(s) / UC Diagnoses   Final diagnoses:  Rash and nonspecific skin eruption     Discharge Instructions      You were seen today for an itchy rash that started after swimming. Based on your history and exam, this is most likely a type of skin irritation such as contact dermatitis, heat rash, or a mild sun-related reaction. You were treated with a steroid injection in the office, and you have been prescribed oral steroids and a topical steroid cream to help reduce inflammation and itching.  Apply the topical cream twice daily to the affected areas until your symptoms improve, but do not use it longer than two weeks unless directed. Take the oral steroid exactly as prescribed. For additional relief, you may use over-the-counter diphenhydramine  (Benadryl ) 50 mg at bedtime to reduce itching and to help with sleep. Cool compresses, lukewarm showers, and wearing loose, breathable clothing may also help ease discomfort. Avoid scratching the rash, as this can worsen irritation and delay healing.  You should expect some improvement in itching soon after treatment, but the rash itself may take several days to gradually fade. Follow up with your primary care provider or a dermatologist if the rash does not improve within a week, if it spreads to new areas, or if you experience repeated episodes.  Go to the emergency department right away if you develop severe symptoms such as swelling of your lips, tongue, or face, trouble breathing, dizziness, or if the rash becomes rapidly widespread and associated with fever or pain.      ED  Prescriptions     Medication Sig Dispense Auth. Provider   predniSONE  (STERAPRED UNI-PAK 21 TAB) 10 MG (21) TBPK tablet Take by mouth daily. Take 6 tabs by mouth daily  for 2 days, then 5 tabs for 2 days, then 4 tabs for 2 days, then 3 tabs for 2 days, 2 tabs for 2 days, then 1 tab by mouth daily for 2 days 42 tablet Rashied Corallo, [place], FNP   triamcinolone  cream (KENALOG ) 0.1 % Apply 1 Application topically 2 (two) times daily. Apply to affected areas two times  a day until symptoms resolve for up to 2 weeks 30 g Iola Lukes, FNP      PDMP not reviewed this encounter.   Iola [place], [place] 05/03/24 [phone]

## 2024-05-03 NOTE — Discharge Instructions (Addendum)
 You were seen today for an itchy rash that started after swimming. Based on your history and exam, this is most likely a type of skin irritation such as contact dermatitis, heat rash, or a mild sun-related reaction. You were treated with a steroid injection in the office, and you have been prescribed oral steroids and a topical steroid cream to help reduce inflammation and itching.  Apply the topical cream twice daily to the affected areas until your symptoms improve, but do not use it longer than two weeks unless directed. Take the oral steroid exactly as prescribed. For additional relief, you may use over-the-counter diphenhydramine  (Benadryl ) 50 mg at bedtime to reduce itching and to help with sleep. Cool compresses, lukewarm showers, and wearing loose, breathable clothing may also help ease discomfort. Avoid scratching the rash, as this can worsen irritation and delay healing.  You should expect some improvement in itching soon after treatment, but the rash itself may take several days to gradually fade. Follow up with your primary care provider or a dermatologist if the rash does not improve within a week, if it spreads to new areas, or if you experience repeated episodes.  Go to the emergency department right away if you develop severe symptoms such as swelling of your lips, tongue, or face, trouble breathing, dizziness, or if the rash becomes rapidly widespread and associated with fever or pain.

## 2024-05-03 NOTE — ED Triage Notes (Signed)
 Patient states that she went to the pool Sunday. Patient states that she developed a rash afterwards. Patient stats that the rash itches. Patient states that she took 100 mg of benadryl  and using benadryl  cream and Cortizone cream. Also across her thighs.

## 2024-05-11 ENCOUNTER — Other Ambulatory Visit: Payer: Self-pay

## 2024-05-12 ENCOUNTER — Other Ambulatory Visit: Payer: Self-pay

## 2024-05-15 ENCOUNTER — Other Ambulatory Visit: Payer: Self-pay

## 2024-05-18 ENCOUNTER — Other Ambulatory Visit: Payer: Self-pay

## 2024-05-22 ENCOUNTER — Other Ambulatory Visit: Payer: Self-pay

## 2024-05-22 ENCOUNTER — Ambulatory Visit: Attending: Orthopedic Surgery

## 2024-05-22 DIAGNOSIS — M546 Pain in thoracic spine: Secondary | ICD-10-CM | POA: Diagnosis not present

## 2024-05-22 DIAGNOSIS — M542 Cervicalgia: Secondary | ICD-10-CM | POA: Diagnosis not present
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
## 2024-05-26 ENCOUNTER — Encounter

## 2024-06-07 ENCOUNTER — Ambulatory Visit: Admitting: Podiatry

## 2024-06-15 ENCOUNTER — Ambulatory Visit
Admission: RE | Admit: 2024-06-15 | Discharge: 2024-06-15 | Disposition: A | Discharge status: Home / Self Care | Source: Ambulatory Visit | Attending: Family Medicine | Admitting: Family Medicine

## 2024-06-15 DIAGNOSIS — Z1231 Encounter for screening mammogram for malignant neoplasm of breast: Secondary | ICD-10-CM | POA: Diagnosis not present

## 2024-07-11 ENCOUNTER — Ambulatory Visit (INDEPENDENT_AMBULATORY_CARE_PROVIDER_SITE_OTHER): Admitting: Nurse Practitioner

## 2024-07-19 ENCOUNTER — Ambulatory Visit: Admitting: Podiatry

## 2024-08-09 DIAGNOSIS — H029 Unspecified disorder of eyelid: Secondary | ICD-10-CM | POA: Diagnosis not present

## 2024-08-14 ENCOUNTER — Ambulatory Visit: Admitting: Podiatry

## 2024-08-14 DIAGNOSIS — L603 Nail dystrophy: Secondary | ICD-10-CM

## 2024-08-14 NOTE — Progress Notes (Signed)
 She presents today for follow-up of her Lamisil  therapy states that her toenails to start growing.  She states that she has to have artificial nails put on her hallux nails bilaterally.  States that she has recently started hair nail formula vitamins to see if that would help.  Objective: Vital signs are stable alert and oriented x 3.  Pulses are palpable.  Hallux nails have not grown out in several months according to the where the artificial nail appears at the base.  Whereas the paint on the other toenails is several months grown out.  Assessment: Nail dystrophy hallux bilateral.  Plan: Follow-up with us  on an as-needed basis.

## 2024-08-17 ENCOUNTER — Other Ambulatory Visit: Payer: Self-pay

## 2024-08-18 ENCOUNTER — Other Ambulatory Visit: Payer: Self-pay

## 2024-08-19 ENCOUNTER — Other Ambulatory Visit: Payer: Self-pay

## 2024-10-23 ENCOUNTER — Other Ambulatory Visit: Payer: Commercial Managed Care - PPO

## 2024-10-24 ENCOUNTER — Ambulatory Visit: Payer: Commercial Managed Care - PPO

## 2024-10-24 ENCOUNTER — Ambulatory Visit: Payer: Commercial Managed Care - PPO | Admitting: Oncology
